# Patient Record
Sex: Male | Born: 1935
Health system: Southern US, Community
[De-identification: ages and names within clinical notes are randomized; demographics above are authoritative.]

## PROBLEM LIST (undated history)

## (undated) DIAGNOSIS — M545 Low back pain, unspecified: Secondary | ICD-10-CM

## (undated) DIAGNOSIS — K219 Gastro-esophageal reflux disease without esophagitis: Secondary | ICD-10-CM

## (undated) DIAGNOSIS — J189 Pneumonia, unspecified organism: Secondary | ICD-10-CM

## (undated) DIAGNOSIS — E7800 Pure hypercholesterolemia, unspecified: Secondary | ICD-10-CM

## (undated) DIAGNOSIS — I6521 Occlusion and stenosis of right carotid artery: Secondary | ICD-10-CM

## (undated) DIAGNOSIS — M1990 Unspecified osteoarthritis, unspecified site: Secondary | ICD-10-CM

## (undated) DIAGNOSIS — Z9981 Dependence on supplemental oxygen: Secondary | ICD-10-CM

## (undated) DIAGNOSIS — I639 Cerebral infarction, unspecified: Secondary | ICD-10-CM

## (undated) DIAGNOSIS — R911 Solitary pulmonary nodule: Secondary | ICD-10-CM

## (undated) DIAGNOSIS — I10 Essential (primary) hypertension: Secondary | ICD-10-CM

## (undated) DIAGNOSIS — G8929 Other chronic pain: Secondary | ICD-10-CM

## (undated) DIAGNOSIS — C4490 Unspecified malignant neoplasm of skin, unspecified: Secondary | ICD-10-CM

## (undated) DIAGNOSIS — J449 Chronic obstructive pulmonary disease, unspecified: Secondary | ICD-10-CM

## (undated) DIAGNOSIS — J42 Unspecified chronic bronchitis: Secondary | ICD-10-CM

## (undated) HISTORY — PX: CATARACT EXTRACTION, BILATERAL: SHX1313

## (undated) HISTORY — PX: BACK SURGERY: SHX140

## (undated) HISTORY — PX: COLONOSCOPY: SHX174

## (undated) HISTORY — PX: LUMBAR SPINE SURGERY: SHX701

---

## 2001-08-12 ENCOUNTER — Emergency Department (HOSPITAL_COMMUNITY): Admission: EM | Admit: 2001-08-12 | Discharge: 2001-08-12 | Payer: Self-pay | Admitting: Emergency Medicine

## 2005-10-09 ENCOUNTER — Ambulatory Visit (HOSPITAL_COMMUNITY): Admission: RE | Admit: 2005-10-09 | Discharge: 2005-10-09 | Payer: Self-pay | Admitting: Internal Medicine

## 2006-12-23 ENCOUNTER — Ambulatory Visit (HOSPITAL_COMMUNITY): Admission: RE | Admit: 2006-12-23 | Discharge: 2006-12-23 | Payer: Self-pay | Admitting: Ophthalmology

## 2007-01-28 ENCOUNTER — Ambulatory Visit (HOSPITAL_COMMUNITY): Admission: RE | Admit: 2007-01-28 | Discharge: 2007-01-28 | Payer: Self-pay | Admitting: Ophthalmology

## 2007-06-24 ENCOUNTER — Emergency Department (HOSPITAL_COMMUNITY): Admission: EM | Admit: 2007-06-24 | Discharge: 2007-06-24 | Payer: Self-pay | Admitting: Emergency Medicine

## 2007-08-06 ENCOUNTER — Emergency Department (HOSPITAL_COMMUNITY): Admission: EM | Admit: 2007-08-06 | Discharge: 2007-08-06 | Payer: Self-pay | Admitting: Emergency Medicine

## 2007-08-16 ENCOUNTER — Ambulatory Visit (HOSPITAL_COMMUNITY): Admission: RE | Admit: 2007-08-16 | Discharge: 2007-08-16 | Payer: Self-pay | Admitting: Internal Medicine

## 2007-11-21 ENCOUNTER — Emergency Department (HOSPITAL_COMMUNITY): Admission: EM | Admit: 2007-11-21 | Discharge: 2007-11-21 | Payer: Self-pay | Admitting: Emergency Medicine

## 2008-02-27 ENCOUNTER — Inpatient Hospital Stay (HOSPITAL_COMMUNITY): Admission: EM | Admit: 2008-02-27 | Discharge: 2008-03-03 | Payer: Self-pay | Admitting: Emergency Medicine

## 2008-02-28 ENCOUNTER — Ambulatory Visit: Payer: Self-pay | Admitting: Gastroenterology

## 2008-03-01 ENCOUNTER — Ambulatory Visit: Payer: Self-pay | Admitting: Gastroenterology

## 2008-03-02 ENCOUNTER — Ambulatory Visit: Payer: Self-pay | Admitting: Internal Medicine

## 2008-03-23 ENCOUNTER — Ambulatory Visit: Payer: Self-pay | Admitting: Gastroenterology

## 2008-03-23 ENCOUNTER — Ambulatory Visit (HOSPITAL_COMMUNITY): Admission: RE | Admit: 2008-03-23 | Discharge: 2008-03-23 | Payer: Self-pay | Admitting: Gastroenterology

## 2008-03-23 ENCOUNTER — Encounter: Payer: Self-pay | Admitting: Gastroenterology

## 2010-12-24 NOTE — Consult Note (Signed)
[name], [name]                ACCOUNT NO.:  [id number]   MEDICAL RECORD NO.:  [id number]          PATIENT TYPE:  INP   LOCATION:  A337                          FACILITY:  [facility]   PHYSICIAN:  Kassie Mends, M.D.      DATE OF BIRTH:  [birth year]   DATE OF CONSULTATION:  DATE OF DISCHARGE:                                 CONSULTATION   REQUESTING PHYSICIAN:  Dr. Felecia Shelling.   REASON FOR CONSULTATION:  Acute alcoholic pancreatitis.   HISTORY OF PRESENT ILLNESS:  [name] is a 77 year old Caucasian  male.  He has a 50+ year history of heavy alcohol abuse.  He denies any  history of pancreatitis.  He tells me he consumes about half a pint of  liquor a day as well as a little bit of beer too.  He awakened yesterday  morning, he felt like he had to go to the bathroom.  He began to have  abdominal pain around the umbilicus.  He tells me he was just not  feeling right.  He ended up calling one of his sons to bring him to the  hospital.  The pain radiated throughout his entire abdomen.  Pain was 6  out of 10 on pain scale.  He did have nausea as well as vomiting.  Pain  did not radiate to his back.  The pain was intermittent.  He does have  heartburn and a history of chronic heartburn and indigestion for which  he takes Prevacid and tells me it is well-controlled prior to this time.  He denies any dysphagia or odynophagia.  Denies any anorexia or early  satiety.  He denies any weight loss.  He does have generally a soft  brown bowel movement on a daily basis but has noticed small amounts of  bright red blood mixed in with his stool and with wiping.  He has seen  dark stools as well.  He has never had a colonoscopy, does not think he  has had an EGD either.  His amylase is elevated at 209, lipase 510.  He  has been started on alcohol withdrawal protocol, being given Dilaudid  for pain, started on thiamine, and a CT of the abdomen and pelvis is  pending.  Acute abdominal series showed mild  hyperinflation, COPD and  atherosclerosis.   PAST MEDICAL AND SURGICAL HISTORY:  1. Alcohol abuse.  2. Back surgery.  3. Hypertension.  4. GERD.  5. BPH.   MEDICATIONS:  Prior to admission:  1. Diovan 160/12.5 mg daily.  2. Flomax 0.4 mg daily.  3. Prevacid 30 mg daily.  4. Hydroxyzine HCl 25 mg daily.  5. Ambien 5 mg nightly.  6. Hydrocodone 5/500 mg p.r.n.   ALLERGIES:  No known drug allergies.   FAMILY HISTORY:  Both parents are deceased; however, patient is unsure  how.  He has 3 brothers, 4 sisters.  There is no known family history of  carcinoma, liver or chronic GI problems; however, patient is poor  historian.   SOCIAL HISTORY:  [name] is has 4 sons, 2 daughters living.  One  daughter deceased secondary to her esophagus came loose.  He is a  retired Music therapist.  He has a 50+ pack-year history of tobacco abuse.  He  denies any drug use.  He does live alone.  He is accompanied by his son  [name] today.   REVIEW OF SYSTEMS:  See HPI.  He has had some lightheadedness and some  fatigue.  Otherwise negative review of systems.   PHYSICAL EXAM:  Weight 72 .3 kilograms.  Height 71 inches.  Temperature  97.3.  Pulse 101.  Respirations 18.  Blood pressure 141/77.  O2 sat 95%  on room air.  GENERAL:  [name] is a disheveled-appearing Caucasian male who is  alert, oriented, pleasant and cooperative in no acute distress.  HEENT:  Sclerae are injected. conjunctivae pink.  Oropharynx pink and  moist.  He does have an NG tube intact with brown gastric contents.  CHEST:  Heart regular rate and rhythm.  Normal S1, S2.  LUNGS:  With rhonchi bilaterally.  No acute distress.  ABDOMEN:  Positive bowel sounds x4.  No bruits auscultated.  Soft,  nondistended.  He does have mild tenderness around the umbilicus.  There  is no rebound, tenderness or guarding.  No hepatosplenomegaly or mass,  although exam is limited.  EXTREMITIES:  He does have clubbing.  No edema.    LABORATORY STUDIES:  From today include sodium 133, potassium 3.2,  chloride 100, CO2 27, glucose 123, creatinine 5.  She had a BUN 5,  creatinine 0.84 and calcium 8.3.  From yesterday he had normal LFTs and  an alcohol level of 115.  He had a white blood cell count of 9.5,  hemoglobin 14.3, hematocrit 41.6 and platelet count 175.   IMPRESSION:  [name] is a 77 year old Caucasian male with what  appears to be acute pancreatitis secondary to alcohol..  He does have a  CT scan and abdomen and pelvis pending.  He has history of chronic  gastroesophageal reflux disease as well.  He has had hematochezia which  is going to require further workup as well.  He also has hypokalemia   PLAN:  1. Will follow up CT scan of abdomen and pelvis.  2. Nothing by mouth, rest, pain control and antiemetics.  3. Urged alcohol cessation.  4. He will need a colonoscopy and EGD once stable.  This will need to      be done under propofol in the operating room most likely as an      outpatient considering the fact that his hemoglobin is normal.  5. Potassium replacement per primary care physician.  6. Will follow up post CT scan.   Thank you Dr. Felecia Shelling for allowing [place] to participate in the care of Mr.  Hensley.      [name], N.P.      Kassie Mends, M.D.  Electronically Signed    KJ/MEDQ  D:  02/28/2008  T:  02/28/2008  Job:  [id number]   cc:   Tesfaye D. Felecia Shelling, MD  Fax: [phone]

## 2010-12-24 NOTE — Op Note (Signed)
[name], [name]                ACCOUNT NO.:  [id number]   MEDICAL RECORD NO.:  [id number]          PATIENT TYPE:  AMB   LOCATION:  DAY                           FACILITY:  [facility]   PHYSICIAN:  Kassie Mends, M.D.      DATE OF BIRTH:  1933/03/26   DATE OF PROCEDURE:  03/23/2008  DATE OF DISCHARGE:                               OPERATIVE REPORT   REFERRING PHYSICIAN:  Tesfaye D. Felecia Shelling, MD   PROCEDURES:  1. Colonoscopy with snare cautery and cold forceps polypectomy.  2. Esophagogastroduodenoscopy with cold forceps biopsy.   INDICATION FOR EXAM:  [name] is a 77 year old male who presented  with rectal bleeding and dyspepsia.  He was seen as an inpatient for  alcoholic hepatitis with a lipase of 510.   FINDINGS:  1. A 1.5-cm sessile ascending colon polyp removed via snare cautery.      A 8-mm sessile ascending colon polyp removed via snare cautery.  A      6-mm sessile descending colon polyp removed via snare cautery.  A 4-      mm descending colon polyp removed via cold forceps.  A 4-mm sigmoid      colon polyp removed via cold snare.  A 8-mm slightly pedunculated      sigmoid colon polyp removed via snare cautery.  2. Moderate internal hemorrhoids.  Otherwise, no masses, inflammatory      changes, or diverticular AVM seen.  3. Patent Schatzki's ring.  Otherwise, normal esophagus without      evidence of Barrett's mass, erosions, or ulcerations.  No      esophageal varices.  4. Patchy erythema throughout the body and the antrum of the stomach.      Biopsies obtained via cold forceps to evaluate for H. pylori      gastritis.  No old blood or fresh blood seen in the stomach.  5. Patchy erythema in the duodenal bulb and the proximal portion of      the second portion of the duodenum.  No old blood or fresh blood      seen in the duodenum.   DIAGNOSES:  1. Multiple colon polyps.  2. Moderate internal hemorrhoids.  The most likely etiology for Mr.      Hensley rectal bleeding  is hemorrhoids.  3. Patent Schatzki's ring.  4. Gastritis. The most likely etiology for [name] dyspepsia is      gastritis.  The differential diagnoses includes alcoholic gastritis      or Helicobacter pylori gastritis.   RECOMMENDATIONS:  1. Screening colonoscopy in 5 to 10 years.  2. Will call [name] with the results of his biopsies.  3. No aspirin, NSAIDs, or anticoagulations for 7 days.  4. He should follow a high-fiber diet.  He is given handout on high-      fiber diet, polyps, and hemorrhoids.  5. Continue Prevacid.   MEDICATIONS:  MAC provided by anesthesia.   PROCEDURE TECHNIQUE:  Physical exam was performed.  Informed consent was  obtained from the patient explaining benefits, risks, and alternatives  to the procedure.  The patient was connected to the monitor and placed  in left lateral position.  Continuous oxygen was provided by nasal  cannula and IV medicine administered through an indwelling cannula.  After administration of sedation and rectal exam, the patient's rectum  was intubated and the scope was advanced under direct visualization to  the cecum.  The scope was removed slowly by carefully examining the  color, texture, anatomy, and integrity of the mucosa on the way out.   After the colonoscopy, the patient's esophagus was intubated with a  diagnostic gastroscope and the scope was advanced under direct  visualization to the second portion of the duodenum.  Scope was removed  slowly by carefully examining the color, texture, anatomy, and integrity  of the mucosa on the way out.  The patient was recovered in endoscopy  and discharged home in satisfactory condition.   PATH:  Simple adenomas. Chronic gastritis. Avoid EtOH. TCS in 10 years  if he remains healthy.      Kassie Mends, M.D.  Electronically Signed     SM/MEDQ  D:  03/23/2008  T:  03/23/2008  Job:  [id number]   cc:   Tesfaye D. Felecia Shelling, MD  Fax: [phone]

## 2010-12-24 NOTE — H&P (Signed)
NAMEJAHN, [name]                ACCOUNT NO.:  [id number]   MEDICAL RECORD NO.:  [id number]          PATIENT TYPE:  INP   LOCATION:  A337                          FACILITY:  [facility]   PHYSICIAN:  Angus G. Renard Matter, MD   DATE OF BIRTH:  1933/02/16   DATE OF ADMISSION:  02/27/2008  DATE OF DISCHARGE:  LH                              HISTORY & PHYSICAL   This patient was admitted through the emergency department with the  chief complaint being abdominal pain.  This 77 year old male presented  with abdominal pain.  He apparently uses alcohol to excess.  The pain is  located in periumbilical area.   PERTINENT LABORATORY STUDIES:  CBC; WBC 9500, hemoglobin 14.3, and  hematocrit 41.6.  Lipase 142.  Alcohol level 115.  Comprehensive  metabolic panel; liver enzymes normal.  Chemistry is otherwise normal.  The patient did have acute abdominal pain.  On chest x-ray, mild  hyperinflation noted and COPD thought to be present on chest.  No free  air, nonspecific bowel gas pattern without obstruction or ileus.   SOCIAL HISTORY:  The patient does smoke and drinks a fair amount of  alcohol.   PAST MEDICAL HISTORY:  The patient has history of hypertension, acute  and chronic back pain.   ALLERGIES:  No known drug allergies.   MEDICATION LIST:  1. Diovan 160/12.5 mg daily.  2. Flomax  0.4 mg daily.  3. Prevacid 30 mg daily.  4. Lactulose 10 g b.i.d.   REVIEW OF SYSTEMS:  HEENT:  Negative.  CARDIOPULMONARY:  No cough,  mumps, or dyspnea.  GI:  Bouts of upper abdominal pain.  No diarrhea or  bleeding.  GU:  No dysuria or hematuria.   OBJECTIVE:  VITAL SIGNS:  Blood pressure 181/96, respiration 20, pulse  89, and temp 96.9.  HEENT:  Eyes, PERRLA.  TM, negative.  Oropharynx benign.  NECK:  Supple.  No JVD or thyroid abnormalities.  HEART:  Regular rhythm.  No murmurs.  ABDOMEN:  Tenderness in periumbilical area.  No organomegaly.  LUNGS:  Clear to P&A.  EXTREMITIES:  Free of edema.   DIAGNOSIS:  Abdominal pain thought secondary to acute pancreatitis.      Angus G. Renard Matter, MD  Electronically Signed     AGM/MEDQ  D:  02/27/2008  T:  02/28/2008  Job:  [id number]

## 2010-12-27 NOTE — Discharge Summary (Signed)
[name], [name]                ACCOUNT NO.:  [id number]   MEDICAL RECORD NO.:  [id number]          PATIENT TYPE:  INP   LOCATION:  A337                          FACILITY:  [facility]   PHYSICIAN:  Tesfaye D. Felecia Shelling, MD   DATE OF BIRTH:  [birth year]   DATE OF ADMISSION:  02/27/2008  DATE OF DISCHARGE:  07/24/2009LH                               DISCHARGE SUMMARY   DISCHARGE DIAGNOSES:  1. Acute pancreatitis.  2. Pneumonia with pleural effusion.  3. Hypokalemia.  4. History of ethyl alcohol abuse.  5. Hypertension.  6. Diskogenic disease.  7. Gastroesophageal reflux disease.  8. Benign prostatic hypertrophy.   DISCHARGE MEDICATIONS:  1. Diovan HCT 160/12.5 one tablets p.o. daily.  2. Flomax 0.4 mg daily.  3. Protonix 40 mg daily.  4. Lactulose 10 mL b.i.d. p.r.n.  5. Thiamine 100 mg daily.  6. Folic acid 1 mg daily.  7. Augmentin 500 mg p.o. t.i.d. for 3 days.   DISPOSITION:  The patient was discharged to home in stable condition.   HOSPITAL COURSE:  This is a 77 year old male patient with history of  hypertension and diskogenic disease who came into emergency room due to  abdominal pain.  The patient has a history of alcohol abuse.  His lab  tests showed an elevated lipase and his alcohol level was also 115.  He  was admitted as the case of acute pancreatitis.  His chest x-ray also  showed sign of infiltrate with pleural effusion.  He was treated  conservatively.  GI consult was done.  He was also started on IV  antibiotics.  Over hospital stay, his symptoms improved.  He was back to  his baseline.  His lipase and amylase came to normal.  The patient was  discharged to home in a stable condition to continue his followup in  outpatient.      Tesfaye D. Felecia Shelling, MD  Electronically Signed    TDF/MEDQ  D:  04/06/2008  T:  04/06/2008  Job:  [id number]

## 2011-05-06 LAB — DIFFERENTIAL
Basophils Absolute: 0
Eosinophils Absolute: 0.1
Neutro Abs: 4.7
Neutrophils Relative %: 75

## 2011-05-06 LAB — URINALYSIS, ROUTINE W REFLEX MICROSCOPIC
Bilirubin Urine: NEGATIVE
Glucose, UA: NEGATIVE
Hgb urine dipstick: NEGATIVE
Ketones, ur: NEGATIVE
Protein, ur: NEGATIVE
Specific Gravity, Urine: 1.015
Urobilinogen, UA: 0.2

## 2011-05-06 LAB — BASIC METABOLIC PANEL
CO2: 28
Calcium: 9.5
GFR calc Af Amer: 53 — ABNORMAL LOW
GFR calc non Af Amer: 44 — ABNORMAL LOW
Glucose, Bld: 145 — ABNORMAL HIGH
Potassium: 5.5 — ABNORMAL HIGH
Sodium: 133 — ABNORMAL LOW

## 2011-05-06 LAB — CBC
HCT: 41.2
WBC: 6.3

## 2011-05-09 LAB — BASIC METABOLIC PANEL
BUN: 2 — ABNORMAL LOW
BUN: 5 — ABNORMAL LOW
BUN: 5 — ABNORMAL LOW
BUN: 17
CO2: 25
CO2: 26
CO2: 27
Calcium: 8.2 — ABNORMAL LOW
Calcium: 8.3 — ABNORMAL LOW
Calcium: 9.1
Calcium: 9.9
Chloride: 101
Chloride: 101
Creatinine, Ser: 0.79
Creatinine, Ser: 0.84
Creatinine, Ser: 0.84
Creatinine, Ser: 0.85
Creatinine, Ser: 0.96
GFR calc Af Amer: >60
GFR calc Af Amer: >60
GFR calc non Af Amer: >60
GFR calc non Af Amer: >60
GFR calc non Af Amer: >60
Glucose, Bld: 112 — ABNORMAL HIGH
Glucose, Bld: 122 — ABNORMAL HIGH
Glucose, Bld: 123 — ABNORMAL HIGH
Glucose, Bld: 100 — ABNORMAL HIGH
Potassium: 3.1 — ABNORMAL LOW
Potassium: 3.2 — ABNORMAL LOW
Sodium: 133 — ABNORMAL LOW

## 2011-05-09 LAB — DIFFERENTIAL
Basophils Absolute: 0
Basophils Relative: 0
Eosinophils Absolute: 0.2
Eosinophils Relative: 0
Eosinophils Relative: 3
Lymphocytes Relative: 7 — ABNORMAL LOW
Lymphocytes Relative: 8 — ABNORMAL LOW
Lymphs Abs: 0.7
Lymphs Abs: 0.9
Monocytes Absolute: 0.1
Monocytes Relative: 4
Monocytes Relative: 6
Neutro Abs: 8.6 — ABNORMAL HIGH
Neutro Abs: 9.4 — ABNORMAL HIGH
Neutrophils Relative %: 89 — ABNORMAL HIGH

## 2011-05-09 LAB — CBC
HCT: 36.7 — ABNORMAL LOW
Hemoglobin: 12.9 — ABNORMAL LOW
MCHC: 34.4
MCHC: 34.6
MCV: 97.6
MCV: 98.4
MCV: 99.5
Platelets: 175
Platelets: 187
RBC: 3.73 — ABNORMAL LOW
RDW: 14.8
WBC: 8.6

## 2011-05-09 LAB — HEPATIC FUNCTION PANEL
AST: 20
Albumin: 2.9 — ABNORMAL LOW
Alkaline Phosphatase: 70
Total Protein: 5.4 — ABNORMAL LOW

## 2011-05-09 LAB — COMPREHENSIVE METABOLIC PANEL
AST: 22
BUN: 9
Chloride: 99
GFR calc Af Amer: >60
Glucose, Bld: 153 — ABNORMAL HIGH
Potassium: 3.7
Sodium: 133 — ABNORMAL LOW

## 2011-05-09 LAB — AMYLASE: Amylase: 209 — ABNORMAL HIGH

## 2011-05-09 LAB — LIPASE, BLOOD
Lipase: 142 — ABNORMAL HIGH
Lipase: 47

## 2011-05-16 LAB — DIFFERENTIAL
Eosinophils Absolute: 0
Eosinophils Relative: 0
Lymphocytes Relative: 11 — ABNORMAL LOW
Lymphs Abs: 1.1
Monocytes Relative: 6

## 2011-05-16 LAB — BASIC METABOLIC PANEL
BUN: 13
CO2: 26
Calcium: 9.3
Creatinine, Ser: 1.36
Glucose, Bld: 135 — ABNORMAL HIGH
Sodium: 126 — ABNORMAL LOW

## 2011-05-16 LAB — CBC
Hemoglobin: 15.2
MCHC: 34
Platelets: 267
RDW: 14.7

## 2011-05-16 LAB — POCT CARDIAC MARKERS
CKMB, poc: 2.9
Myoglobin, poc: >500
Troponin i, poc: <0.05

## 2011-05-16 LAB — LIPASE, BLOOD: Lipase: 16

## 2011-05-16 LAB — AMYLASE: Amylase: 33

## 2011-08-26 DIAGNOSIS — I10 Essential (primary) hypertension: Secondary | ICD-10-CM | POA: Diagnosis not present

## 2011-08-26 DIAGNOSIS — J449 Chronic obstructive pulmonary disease, unspecified: Secondary | ICD-10-CM | POA: Diagnosis not present

## 2013-01-24 DIAGNOSIS — R404 Transient alteration of awareness: Secondary | ICD-10-CM | POA: Diagnosis not present

## 2013-01-24 DIAGNOSIS — R5381 Other malaise: Secondary | ICD-10-CM | POA: Diagnosis not present

## 2013-01-25 ENCOUNTER — Emergency Department (HOSPITAL_COMMUNITY): Payer: Medicare Other

## 2013-01-25 ENCOUNTER — Encounter (HOSPITAL_COMMUNITY): Payer: Self-pay | Admitting: Emergency Medicine

## 2013-01-25 ENCOUNTER — Emergency Department (HOSPITAL_COMMUNITY)
Admission: EM | Admit: 2013-01-25 | Discharge: 2013-01-25 | Disposition: A | Discharge status: Home / Self Care | Payer: Medicare Other | Attending: Emergency Medicine | Admitting: Emergency Medicine

## 2013-01-25 DIAGNOSIS — R05 Cough: Secondary | ICD-10-CM | POA: Insufficient documentation

## 2013-01-25 DIAGNOSIS — J441 Chronic obstructive pulmonary disease with (acute) exacerbation: Secondary | ICD-10-CM | POA: Diagnosis not present

## 2013-01-25 DIAGNOSIS — R059 Cough, unspecified: Secondary | ICD-10-CM | POA: Insufficient documentation

## 2013-01-25 DIAGNOSIS — R5381 Other malaise: Secondary | ICD-10-CM | POA: Diagnosis not present

## 2013-01-25 DIAGNOSIS — I10 Essential (primary) hypertension: Secondary | ICD-10-CM | POA: Insufficient documentation

## 2013-01-25 DIAGNOSIS — I499 Cardiac arrhythmia, unspecified: Secondary | ICD-10-CM | POA: Diagnosis not present

## 2013-01-25 DIAGNOSIS — F17200 Nicotine dependence, unspecified, uncomplicated: Secondary | ICD-10-CM | POA: Insufficient documentation

## 2013-01-25 DIAGNOSIS — R52 Pain, unspecified: Secondary | ICD-10-CM | POA: Insufficient documentation

## 2013-01-25 DIAGNOSIS — R0602 Shortness of breath: Secondary | ICD-10-CM | POA: Diagnosis not present

## 2013-01-25 DIAGNOSIS — J9819 Other pulmonary collapse: Secondary | ICD-10-CM | POA: Diagnosis not present

## 2013-01-25 DIAGNOSIS — J449 Chronic obstructive pulmonary disease, unspecified: Secondary | ICD-10-CM

## 2013-01-25 DIAGNOSIS — E860 Dehydration: Secondary | ICD-10-CM | POA: Diagnosis not present

## 2013-01-25 HISTORY — DX: Chronic obstructive pulmonary disease, unspecified: J44.9

## 2013-01-25 HISTORY — DX: Essential (primary) hypertension: I10

## 2013-01-25 LAB — BASIC METABOLIC PANEL
BUN: 33 mg/dL — ABNORMAL HIGH (ref 6–23)
Chloride: 87 mEq/L — ABNORMAL LOW (ref 96–112)
GFR calc Af Amer: 45 mL/min — ABNORMAL LOW (ref >90)
GFR calc non Af Amer: 39 mL/min — ABNORMAL LOW (ref >90)
Potassium: 3.4 mEq/L — ABNORMAL LOW (ref 3.5–5.1)
Sodium: 131 mEq/L — ABNORMAL LOW (ref 135–145)

## 2013-01-25 LAB — CBC WITH DIFFERENTIAL/PLATELET
Basophils Absolute: 0 10*3/uL (ref 0.0–0.1)
Basophils Relative: 0 % (ref 0–1)
Eosinophils Absolute: 0 10*3/uL (ref 0.0–0.7)
Hemoglobin: 18.8 g/dL — ABNORMAL HIGH (ref 13.0–17.0)
MCH: 33.5 pg (ref 26.0–34.0)
MCHC: 37.4 g/dL — ABNORMAL HIGH (ref 30.0–36.0)
Monocytes Relative: 9 % (ref 3–12)
Neutro Abs: 6.3 10*3/uL (ref 1.7–7.7)
Neutrophils Relative %: 74 % (ref 43–77)
Platelets: 113 10*3/uL — ABNORMAL LOW (ref 150–400)

## 2013-01-25 LAB — TROPONIN I: Troponin I: <0.3 ng/mL (ref <0.30)

## 2013-01-25 LAB — PRO B NATRIURETIC PEPTIDE: Pro B Natriuretic peptide (BNP): 1673 pg/mL — ABNORMAL HIGH (ref 0–450)

## 2013-01-25 MED ORDER — HYDROCODONE-ACETAMINOPHEN 5-325 MG PO TABS: 2.0000 | ORAL_TABLET | ORAL | Status: DC | PRN

## 2013-01-25 MED ORDER — PREDNISONE 20 MG PO TABS: ORAL_TABLET | ORAL | Status: DC

## 2013-01-25 MED ORDER — IPRATROPIUM BROMIDE 0.02 % IN SOLN
0.5000 mg | Freq: Once | RESPIRATORY_TRACT | Status: AC
  Administered 2013-01-25: 0.5 mg via RESPIRATORY_TRACT
  Filled 2013-01-25: qty 2.5

## 2013-01-25 MED ORDER — SODIUM CHLORIDE 0.9 % IV BOLUS (SEPSIS)
1000.0000 mL | Freq: Once | INTRAVENOUS | Status: AC
  Administered 2013-01-25: 1000 mL via INTRAVENOUS

## 2013-01-25 MED ORDER — ALBUTEROL SULFATE (5 MG/ML) 0.5% IN NEBU
5.0000 mg | INHALATION_SOLUTION | Freq: Once | RESPIRATORY_TRACT | Status: AC
  Administered 2013-01-25: 5 mg via RESPIRATORY_TRACT
  Filled 2013-01-25: qty 1

## 2013-01-25 MED ORDER — ALBUTEROL SULFATE (2.5 MG/3ML) 0.083% IN NEBU: 2.5000 mg | INHALATION_SOLUTION | RESPIRATORY_TRACT | Status: AC | PRN

## 2013-01-25 NOTE — ED Provider Notes (Signed)
History     This chart was scribed for Travis Hutching, MD, MD by Smitty Pluck, ED Scribe. The patient was seen in room APA05/APA05 and the patient's care was started at 12:28 PM.   CSN: [id number]  Arrival date & time 01/25/13  1156       Chief Complaint  Patient presents with  . Shortness of Breath  . Weakness     The history is provided by medical records, a relative and the patient. No language interpreter was used.   HPI Comments: [name] is a 79 y.o. male with hx of HTN who presents to the Emergency Department complaining of constant, moderate productive cough and SOB that has been ongoing for the past month and worsening within 2-3 days. Pt is unsure of sputum color. Daughter states that pt has gotten weaker within the past month. Pt was seen by Dr. Felecia Shelling today and referred to ED. Pt was evaluated by EMS last night due to daughter calling 9-1-1 and they states he was dehydrated. He denies using O2 at home. Pt reports that he has generalized body aches that he describes as "cramps." Pt reports that he smokes cigarettes and drinks alcohol occasionally.   Past Medical History  Diagnosis Date  . Hypertension     Past Surgical History  Procedure Laterality Date  . Back surgery      No family history on file.  History  Substance Use Topics  . Smoking status: Current Every Day Smoker -- 1.00 packs/day    Types: Cigarettes  . Smokeless tobacco: Not on file  . Alcohol Use: No      Review of Systems 10 Systems reviewed and all are negative for acute change except as noted in the HPI.   Allergies  Review of patient's allergies indicates no known allergies.  Home Medications  No current outpatient prescriptions on file.  There were no vitals taken for this visit.  Physical Exam  Nursing note and vitals reviewed. Constitutional: He appears well-developed and well-nourished. He appears cachectic.  Appears dehydrated  HENT:  Head: Normocephalic and atraumatic.   Eyes: Conjunctivae and EOM are normal. Pupils are equal, round, and reactive to light.  Neck: Normal range of motion. Neck supple.  Cardiovascular: Normal rate, regular rhythm and normal heart sounds.   Pulmonary/Chest: Effort normal. He has wheezes (minimal expiratory).  Abdominal: Soft. Bowel sounds are normal.  Musculoskeletal: Normal range of motion.  Neurological: He is alert.  Skin: Skin is warm and dry.  Psychiatric: He has a normal mood and affect.    ED Course  Procedures (including critical care time) DIAGNOSTIC STUDIES: Oxygen Saturation is 97% on room air, adequate by my interpretation.    COORDINATION OF CARE: 12:31 PM Discussed ED treatment with pt and pt agrees to IV fluids, chest xray and labs.     Labs Reviewed  CBC WITH DIFFERENTIAL - Abnormal; Notable for the following:    Hemoglobin 18.8 (*)    MCHC 37.4 (*)    Platelets 113 (*)    All other components within normal limits  BASIC METABOLIC PANEL - Abnormal; Notable for the following:    Sodium 131 (*)    Potassium 3.4 (*)    Chloride 87 (*)    Glucose, Bld 100 (*)    BUN 33 (*)    Creatinine, Ser 1.63 (*)    GFR calc non Af Amer 39 (*)    GFR calc Af Amer 45 (*)    All other components  within normal limits  PRO B NATRIURETIC PEPTIDE - Abnormal; Notable for the following:    Pro B Natriuretic peptide (BNP) 1673.0 (*)    All other components within normal limits  TROPONIN I   No results found. Dg Chest 2 View  01/25/2013   *RADIOLOGY REPORT*  Clinical Data: Shortness of breath.  Weakness.  CHEST - 2 VIEW  Comparison: 03/02/2008.  Findings: Normal sized heart.  Clear lungs.  The lungs remain hyperexpanded with mildly prominent interstitial markings. Decreased density at the right lung base with no nodule seen. Bilateral nipple shadows are noted.  Mild thoracic spine degenerative changes.  IMPRESSION:  1.  Stable changes of COPD. 2.  Improved right basilar atelectasis with no nodule seen.   Original  Report Authenticated By: Beckie Salts, M.D.    No diagnosis found.   Date: 01/25/2013  Rate: 94  Rhythm: normal sinus rhythm  QRS Axis: normal  Intervals: normal  ST/T Wave abnormalities: normal  Conduction Disutrbances:right bundle branch block and left anterior fascicular block  Narrative Interpretation:   Old EKG Reviewed: changes noted   MDM  Patient has long-standing COPD.  Chest xray shows no acute findings.  D/C meds albuterol soln, prenisone, norco.  Pt has primary care f/u.   Hemodynamically stable     I personally performed the services described in this documentation, which was scribed in my presence. The recorded information has been reviewed and is accurate.   Travis Hutching, MD 01/25/13 1536

## 2013-01-25 NOTE — ED Notes (Signed)
Pt states he has not been feeling well x 2 weeks. States he has become increasingly weak and sob x 2-3 days. Denies pain.

## 2013-01-25 NOTE — ED Notes (Signed)
Dr Cook at bedside,  

## 2013-01-25 NOTE — ED Notes (Addendum)
Pt presents to er with c/o sob, generalized weakness that started a few weeks ago, becoming worse over the past two days, was sent to er for further evaluation by Dr. Felecia Shelling, pt alert, able to answer questions, sob is worse with exertion. Denies any swelling or pain.  admits to cough that is productive, unsure of color of sputum and chills.  Pt does state that he has had problems with his abd area with abd pain, pt points to epigastric area when asked to show where but states that the problems with his abd are gone.

## 2013-01-25 NOTE — ED Notes (Signed)
Pt heart rate increased to 148 on monitor, pt alert able to answer questions, denies any chest pain, feeling of hr beating fast, sob. Repeat ekg performed given to Dr. Adriana Simas

## 2013-03-21 ENCOUNTER — Encounter: Payer: Self-pay | Admitting: Gastroenterology

## 2014-04-18 DIAGNOSIS — R7309 Other abnormal glucose: Secondary | ICD-10-CM | POA: Diagnosis not present

## 2014-04-18 DIAGNOSIS — R799 Abnormal finding of blood chemistry, unspecified: Secondary | ICD-10-CM | POA: Diagnosis not present

## 2014-04-18 DIAGNOSIS — F17200 Nicotine dependence, unspecified, uncomplicated: Secondary | ICD-10-CM | POA: Diagnosis not present

## 2014-04-18 DIAGNOSIS — I10 Essential (primary) hypertension: Secondary | ICD-10-CM | POA: Diagnosis not present

## 2014-04-18 DIAGNOSIS — Z23 Encounter for immunization: Secondary | ICD-10-CM | POA: Diagnosis not present

## 2014-04-18 DIAGNOSIS — M549 Dorsalgia, unspecified: Secondary | ICD-10-CM | POA: Diagnosis not present

## 2014-04-18 DIAGNOSIS — J410 Simple chronic bronchitis: Secondary | ICD-10-CM | POA: Diagnosis not present

## 2014-09-28 DIAGNOSIS — F17200 Nicotine dependence, unspecified, uncomplicated: Secondary | ICD-10-CM | POA: Diagnosis not present

## 2014-09-28 DIAGNOSIS — C4490 Unspecified malignant neoplasm of skin, unspecified: Secondary | ICD-10-CM | POA: Diagnosis not present

## 2014-09-28 DIAGNOSIS — Z720 Tobacco use: Secondary | ICD-10-CM | POA: Diagnosis not present

## 2014-09-28 DIAGNOSIS — K861 Other chronic pancreatitis: Secondary | ICD-10-CM | POA: Diagnosis not present

## 2014-09-28 DIAGNOSIS — J449 Chronic obstructive pulmonary disease, unspecified: Secondary | ICD-10-CM | POA: Diagnosis not present

## 2014-09-28 DIAGNOSIS — M545 Low back pain: Secondary | ICD-10-CM | POA: Diagnosis not present

## 2014-09-28 DIAGNOSIS — N400 Enlarged prostate without lower urinary tract symptoms: Secondary | ICD-10-CM | POA: Diagnosis not present

## 2014-09-28 DIAGNOSIS — I10 Essential (primary) hypertension: Secondary | ICD-10-CM | POA: Diagnosis not present

## 2014-10-19 ENCOUNTER — Inpatient Hospital Stay (HOSPITAL_COMMUNITY)
Admission: EM | Admit: 2014-10-19 | Discharge: 2014-10-23 | DRG: 641 | Disposition: A | Discharge status: Home / Self Care | Payer: Medicare Other | Attending: Internal Medicine | Admitting: Internal Medicine

## 2014-10-19 ENCOUNTER — Emergency Department (HOSPITAL_COMMUNITY): Payer: Medicare Other

## 2014-10-19 ENCOUNTER — Encounter (HOSPITAL_COMMUNITY): Payer: Self-pay | Admitting: Emergency Medicine

## 2014-10-19 ENCOUNTER — Other Ambulatory Visit (HOSPITAL_COMMUNITY): Payer: Self-pay

## 2014-10-19 DIAGNOSIS — N400 Enlarged prostate without lower urinary tract symptoms: Secondary | ICD-10-CM | POA: Diagnosis present

## 2014-10-19 DIAGNOSIS — E871 Hypo-osmolality and hyponatremia: Secondary | ICD-10-CM | POA: Diagnosis not present

## 2014-10-19 DIAGNOSIS — E876 Hypokalemia: Secondary | ICD-10-CM | POA: Diagnosis present

## 2014-10-19 DIAGNOSIS — R627 Adult failure to thrive: Secondary | ICD-10-CM | POA: Diagnosis present

## 2014-10-19 DIAGNOSIS — R531 Weakness: Secondary | ICD-10-CM | POA: Diagnosis not present

## 2014-10-19 DIAGNOSIS — J449 Chronic obstructive pulmonary disease, unspecified: Secondary | ICD-10-CM | POA: Diagnosis present

## 2014-10-19 DIAGNOSIS — R0602 Shortness of breath: Secondary | ICD-10-CM | POA: Diagnosis not present

## 2014-10-19 DIAGNOSIS — R079 Chest pain, unspecified: Secondary | ICD-10-CM

## 2014-10-19 DIAGNOSIS — N179 Acute kidney failure, unspecified: Secondary | ICD-10-CM | POA: Diagnosis present

## 2014-10-19 DIAGNOSIS — I10 Essential (primary) hypertension: Secondary | ICD-10-CM | POA: Diagnosis not present

## 2014-10-19 DIAGNOSIS — R32 Unspecified urinary incontinence: Secondary | ICD-10-CM | POA: Diagnosis present

## 2014-10-19 DIAGNOSIS — M545 Low back pain: Secondary | ICD-10-CM | POA: Diagnosis not present

## 2014-10-19 DIAGNOSIS — E870 Hyperosmolality and hypernatremia: Secondary | ICD-10-CM | POA: Diagnosis not present

## 2014-10-19 DIAGNOSIS — F17210 Nicotine dependence, cigarettes, uncomplicated: Secondary | ICD-10-CM | POA: Diagnosis present

## 2014-10-19 DIAGNOSIS — J439 Emphysema, unspecified: Secondary | ICD-10-CM

## 2014-10-19 LAB — HEPATIC FUNCTION PANEL
ALT: 19 U/L (ref 0–53)
AST: 39 U/L — ABNORMAL HIGH (ref 0–37)
Albumin: 4 g/dL (ref 3.5–5.2)
Alkaline Phosphatase: 75 U/L (ref 39–117)
Bilirubin, Direct: 0.3 mg/dL (ref 0.0–0.5)
Indirect Bilirubin: 0.9 mg/dL (ref 0.3–0.9)
Total Bilirubin: 1.2 mg/dL (ref 0.3–1.2)
Total Protein: 6.7 g/dL (ref 6.0–8.3)

## 2014-10-19 LAB — BASIC METABOLIC PANEL
ANION GAP: 14 (ref 5–15)
BUN: 15 mg/dL (ref 6–23)
CALCIUM: 8.4 mg/dL (ref 8.4–10.5)
CO2: 24 mmol/L (ref 19–32)
Chloride: 69 mmol/L — ABNORMAL LOW (ref 96–112)
Creatinine, Ser: 1.07 mg/dL (ref 0.50–1.35)
GFR calc Af Amer: 74 mL/min — ABNORMAL LOW (ref >90)
GFR calc non Af Amer: 64 mL/min — ABNORMAL LOW (ref >90)
GLUCOSE: 112 mg/dL — AB (ref 70–99)
POTASSIUM: 3.2 mmol/L — AB (ref 3.5–5.1)
Sodium: 107 mmol/L — CL (ref 135–145)

## 2014-10-19 LAB — CBC
HCT: 37.5 % — ABNORMAL LOW (ref 39.0–52.0)
Hemoglobin: 14.2 g/dL (ref 13.0–17.0)
MCH: 31.8 pg (ref 26.0–34.0)
MCHC: 37.9 g/dL — ABNORMAL HIGH (ref 30.0–36.0)
MCV: 84.1 fL (ref 78.0–100.0)
Platelets: 178 10*3/uL (ref 150–400)
RBC: 4.46 MIL/uL (ref 4.22–5.81)
RDW: 13.2 % (ref 11.5–15.5)
WBC: 7.5 10*3/uL (ref 4.0–10.5)

## 2014-10-19 LAB — BRAIN NATRIURETIC PEPTIDE: B NATRIURETIC PEPTIDE 5: 36 pg/mL (ref 0.0–100.0)

## 2014-10-19 LAB — TROPONIN I: TROPONIN I: 0.03 ng/mL (ref <0.031)

## 2014-10-19 MED ORDER — SODIUM CHLORIDE 3 % IV SOLN
Freq: Once | INTRAVENOUS | Status: AC
  Administered 2014-10-19: 100 mL/h via INTRAVENOUS
  Filled 2014-10-19: qty 500

## 2014-10-19 MED ORDER — ASPIRIN 325 MG PO TABS
325.0000 mg | ORAL_TABLET | ORAL | Status: AC
  Administered 2014-10-19: 325 mg via ORAL
  Filled 2014-10-19: qty 1

## 2014-10-19 MED ORDER — NITROGLYCERIN 0.4 MG SL SUBL
0.4000 mg | SUBLINGUAL_TABLET | SUBLINGUAL | Status: DC | PRN
  Administered 2014-10-19: 0.4 mg via SUBLINGUAL
  Filled 2014-10-19: qty 1

## 2014-10-19 NOTE — H&P (Signed)
PCP:   FANTA,TESFAYE, MD   Chief Complaint:  Chest pain  HPI: 81 year old male who   has a past medical history of Hypertension and COPD (chronic obstructive pulmonary disease). Today came to the hospital with chief complaint of chest pain, abdominal pain going on for past 3 days. Patient also endorses generalized decreased appetite and weight loss over the past 3 months. He also had shortness of breath but denies shortness of breath at this time. Denies nausea vomiting or diarrhea. Patient smokes 1 pack per day. In the ED patient was found to have hyponatremia with sodium 107, patient has been started on 3% hypertonic saline.  Allergies:  No Known Allergies    Past Medical History  Diagnosis Date  . Hypertension   . COPD (chronic obstructive pulmonary disease)     Past Surgical History  Procedure Laterality Date  . Back surgery      Prior to Admission medications   Medication Sig Start Date End Date Taking? Authorizing Provider  albuterol (PROVENTIL HFA;VENTOLIN HFA) 108 (90 BASE) MCG/ACT inhaler Inhale 2 puffs into the lungs every 6 (six) hours as needed for wheezing.   Yes Historical Provider, MD  albuterol (PROVENTIL) (2.5 MG/3ML) 0.083% nebulizer solution Take 3 mLs (2.5 mg total) by nebulization every 4 (four) hours as needed for wheezing. 01/25/13   Nat Christen, MD  amLODipine (NORVASC) 5 MG tablet Take 5 mg by mouth daily. 09/28/14   Historical Provider, MD  predniSONE (DELTASONE) 20 MG tablet 3 tabs po day one, then 2 tabs daily x 4 days 01/25/13   Nat Christen, MD  tamsulosin (FLOMAX) 0.4 MG CAPS capsule Take 1 capsule by mouth daily. 09/28/14   Historical Provider, MD  valsartan-hydrochlorothiazide (DIOVAN-HCT) 160-25 MG per tablet  09/28/14   Historical Provider, MD    Social History:  reports that he has been smoking Cigarettes.  He has been smoking about 1.00 pack per day. He does not have any smokeless tobacco history on file. He reports that he does not drink alcohol  or use illicit drugs.  Family history-noncontributory  All the positives are listed in BOLD  Review of Systems:  HEENT: Headache, blurred vision, runny nose, sore throat Neck: Hypothyroidism, hyperthyroidism,,lymphadenopathy Chest : Shortness of breath, history of COPD, Asthma Heart : Chest pain, history of coronary arterey disease GI:  Nausea, vomiting, diarrhea, constipation, GERD GU: Dysuria, urgency, frequency of urination, hematuria Neuro: Stroke, seizures, syncope Psych: Depression, anxiety, hallucinations   Physical Exam: Blood pressure 135/77, pulse 83, resp. rate 13, SpO2 98 %. Constitutional:   Patient is a well-developed and well-nourished male* in no acute distress and cooperative with exam. Head: Normocephalic and atraumatic Mouth: Mucus membranes moist Eyes: PERRL, EOMI, conjunctivae normal Neck: Supple, No Thyromegaly Cardiovascular: RRR, S1 normal, S2 normal Pulmonary/Chest: CTAB, no wheezes, rales, or rhonchi Abdominal: Soft. Non-tender, non-distended, bowel sounds are normal, no masses, organomegaly, or guarding present.  Neurological: A&O x3, Strength is normal and symmetric bilaterally, cranial nerve II-XII are grossly intact, no focal motor deficit, sensory intact to light touch bilaterally.  Extremities : No Cyanosis, Clubbing or Edema  Labs on Admission:  Basic Metabolic Panel:  Recent Labs Lab 10/19/14 2140  NA 107*  K 3.2*  CL 69*  CO2 24  GLUCOSE 112*  BUN 15  CREATININE 1.07  CALCIUM 8.4   Liver Function Tests:  Recent Labs Lab 10/19/14 2200  AST 39*  ALT 19  ALKPHOS 75  BILITOT 1.2  PROT 6.7  ALBUMIN 4.0  No results for input(s): LIPASE, AMYLASE in the last 168 hours. No results for input(s): AMMONIA in the last 168 hours. CBC:  Recent Labs Lab 10/19/14 2140  WBC 7.5  HGB 14.2  HCT 37.5*  MCV 84.1  PLT 178   Cardiac Enzymes:  Recent Labs Lab 10/19/14 2140  TROPONINI 0.03    BNP (last 3 results)  Recent Labs   10/19/14 2140  BNP 36.0    ProBNP (last 3 results) No results for input(s): PROBNP in the last 8760 hours.  CBG: No results for input(s): GLUCAP in the last 168 hours.  Radiological Exams on Admission: Dg Chest Port 1 View  10/19/2014   CLINICAL DATA:  Chest pain beginning today. Generalized abdominal pain for 3 days. Shortness of breath.  EXAM: PORTABLE CHEST - 1 VIEW  COMPARISON:  01/25/2013  FINDINGS: Hyperinflation suggesting emphysema. Probable scarring in the lung bases. The heart size and mediastinal contours are within normal limits. Both lungs are clear. The visualized skeletal structures are unremarkable.  IMPRESSION: Emphysematous changes in the lungs with scarring in the lung bases. No evidence of active disease.   Electronically Signed   By: Lucienne Capers M.D.   On: 10/19/2014 22:42    EKG: Independently reviewed. Sinus rhythm   Assessment/Plan Principal Problem:   Hyponatremia Active Problems:   Chest pain   HTN (hypertension)   COPD (chronic obstructive pulmonary disease)  Hyponatremia Patient presenting with severe hyponatremia with sodium 107, 3% hypertonic saline started by the ED physician. Will check BMP every 8 hours, will place on fluid restriction 1200 mL per day. Will check serum and urine osmolality. Will check urine sodium and urine creatinine. We'll also check TSH.  Chest pain Patient's EKG shows normal sinus rhythm, will check serial troponin every 8 hours. Will give morphine for pain.   Hypertension Continue amlodipine, will hold valsartan HCTZ at this time as patient has severe hyponatremia.  COPD Patient is not short of breath at this time, will start albuterol every 2 hours when necessary for wheezing  DVT prophylaxis Lovenox   Code status: Full code  Family discussion:  no family at bedside   Time Spent on Admission: 60 min  [facility] Pager: [phone] 10/19/2014, 11:33 PM  If 7PM-7AM, please contact  night-coverage  [email or web address]  Password [email or web address]

## 2014-10-19 NOTE — ED Notes (Signed)
Pt c/o sob for a couple days pt is on 2lpm and sat is now 89

## 2014-10-19 NOTE — ED Notes (Signed)
12 lead EKG completed in triage, copy given to Dr. Baldwin Racicot Singer and copy given to Ivin Booty, [place]

## 2014-10-19 NOTE — ED Notes (Signed)
Patient presents to registration area with son patient c/o of SOB times two days. States he used an inhaler at home multiple times with no relief. VS 149/99 HR 81 oral temp 97.8 RR 24 stats 88% on Room air. Dyspnea with exertion noted. Triage nurse and charge nurse informed of all of the above information.

## 2014-10-19 NOTE — ED Notes (Signed)
CRITICAL VALUE ALERT  Critical value received:na 107  Date of notification: 10/19/14  Time of notification: 2223  Critical value read back:yes  Nurse who received alert:  smoore rn MD notified (1st page): dr Wilson Singer  Time of first page: 2225 MD notified (2nd page):  Time of second page:  Responding MD:  Dr Wilson Singer Time MD responded: 2230

## 2014-10-19 NOTE — ED Provider Notes (Signed)
CSN: [id number]     Arrival date & time 10/19/14  2026 History  This chart was scribed for Virgel Manifold, MD by Delphia Grates, ED Scribe. This patient was seen in room APA14/APA14 and the patient's care was started at 10:08 PM.     Chief Complaint  Patient presents with  . Shortness of Breath    The history is provided by the patient and medical records. No language interpreter was used.     HPI Comments: DAMASO LADAY is a 81 y.o. male, with history of COPD and HTN, who presents to the Emergency Department complaining of persistent lower abdominal pain that began 3 days ago. He is also complaining of generalized chst pain and neck pain. There is associated generalized decreased appetite and weight loss (unknown loss; over the past 3 months). He also notes bladder incontinence, but reports he is currently taking a medication for this. Patient resides with son. He denies difficulty breathing at this time. He denies fever, nausea, diarrhea, hematochezia. Patient is a current ppd smoker.  Per nursing note, patient is brought in by son complaining of SOB for the past 2 days. He has been using his inhaler without significant relief.  PCP: Rosita Fire, MD   Past Medical History  Diagnosis Date  . Hypertension   . COPD (chronic obstructive pulmonary disease)    Past Surgical History  Procedure Laterality Date  . Back surgery     No family history on file. History  Substance Use Topics  . Smoking status: Current Every Day Smoker -- 1.00 packs/day    Types: Cigarettes  . Smokeless tobacco: Not on file  . Alcohol Use: No    Review of Systems  Constitutional: Positive for appetite change (decrease) and unexpected weight change (loss).  Respiratory: Positive for shortness of breath.   Cardiovascular: Positive for chest pain.  Musculoskeletal: Positive for neck pain.  All other systems reviewed and are negative.     Allergies  Review of patient's allergies indicates no known  allergies.  Home Medications   Prior to Admission medications   Medication Sig Start Date End Date Taking? Authorizing Provider  albuterol (PROVENTIL HFA;VENTOLIN HFA) 108 (90 BASE) MCG/ACT inhaler Inhale 2 puffs into the lungs every 6 (six) hours as needed for wheezing.    Historical Provider, MD  albuterol (PROVENTIL) (2.5 MG/3ML) 0.083% nebulizer solution Take 3 mLs (2.5 mg total) by nebulization every 4 (four) hours as needed for wheezing. 01/25/13   Nat Christen, MD  HYDROcodone-acetaminophen (NORCO) 5-325 MG per tablet Take 2 tablets by mouth every 4 (four) hours as needed for pain. 01/25/13   Nat Christen, MD  ibuprofen (ADVIL,MOTRIN) 200 MG tablet Take 400 mg by mouth every 6 (six) hours as needed for pain.    Historical Provider, MD  predniSONE (DELTASONE) 20 MG tablet 3 tabs po day one, then 2 tabs daily x 4 days 01/25/13   Nat Christen, MD   BP 135/77 mmHg  Pulse 83  Resp 13  SpO2 98% Physical Exam  Constitutional: He appears well-developed and well-nourished. No distress.  HENT:  Head: Normocephalic and atraumatic.  Right Ear: External ear normal.  Left Ear: External ear normal.  Eyes: Conjunctivae are normal. Right eye exhibits no discharge. Left eye exhibits no discharge. No scleral icterus.  Neck: Neck supple. No tracheal deviation present.  Cardiovascular: Normal rate, regular rhythm and intact distal pulses.   Pulmonary/Chest: Effort normal. No stridor. No respiratory distress. He has wheezes. He has no rales.  Bilateral  wheezing.  Abdominal: Soft. Bowel sounds are normal. He exhibits no distension. There is no tenderness. There is no rebound and no guarding.  Musculoskeletal: He exhibits no edema or tenderness.  Neurological: He has normal strength. No cranial nerve deficit (no facial droop, extraocular movements intact, no slurred speech) or sensory deficit. He exhibits normal muscle tone. He displays no seizure activity. Coordination normal.  Pt very with very flat affect.  Slow to respond to questions. Follows commands but needs coaxing and movements very slow.   Skin: Skin is warm and dry. No rash noted.  Psychiatric:  Withdrawn. Flat affect.  Nursing note and vitals reviewed.   ED Course  Procedures (including critical care time)  CRITICAL CARE Performed by: Virgel Manifold   Total critical care time: 35 minutes  Critical care time was exclusive of separately billable procedures and treating other patients. Critical care was necessary to treat or prevent imminent or life-threatening deterioration. Critical care was time spent personally by me on the following activities: development of treatment plan with patient and/or surrogate as well as nursing, discussions with consultants, evaluation of patient's response to treatment, examination of patient, obtaining history from patient or surrogate, ordering and performing treatments and interventions, ordering and review of laboratory studies, ordering and review of radiographic studies, pulse oximetry and re-evaluation of patient's condition.   DIAGNOSTIC STUDIES: Oxygen Saturation is 98% on 2L [place], normal by my interpretation.    COORDINATION OF CARE: At 2216 Discussed treatment plan with patient. Patient agrees.   Labs Review Labs Reviewed  CBC - Abnormal; Notable for the following:    HCT 37.5 (*)    MCHC 37.9 (*)    All other components within normal limits  BASIC METABOLIC PANEL - Abnormal; Notable for the following:    Sodium 107 (*)    Potassium 3.2 (*)    Chloride 69 (*)    Glucose, Bld 112 (*)    GFR calc non Af Amer 64 (*)    GFR calc Af Amer 74 (*)    All other components within normal limits  HEPATIC FUNCTION PANEL - Abnormal; Notable for the following:    AST 39 (*)    All other components within normal limits  BRAIN NATRIURETIC PEPTIDE  TROPONIN I  TSH  OSMOLALITY  CREATININE, URINE, RANDOM  SODIUM, URINE, RANDOM  OSMOLALITY, URINE    Imaging Review Dg Chest Port 1  View  10/19/2014   CLINICAL DATA:  Chest pain beginning today. Generalized abdominal pain for 3 days. Shortness of breath.  EXAM: PORTABLE CHEST - 1 VIEW  COMPARISON:  01/25/2013  FINDINGS: Hyperinflation suggesting emphysema. Probable scarring in the lung bases. The heart size and mediastinal contours are within normal limits. Both lungs are clear. The visualized skeletal structures are unremarkable.  IMPRESSION: Emphysematous changes in the lungs with scarring in the lung bases. No evidence of active disease.   Electronically Signed   By: Lucienne Capers M.D.   On: 10/19/2014 22:42     EKG Interpretation None      MDM   Final diagnoses:  Hyponatremia  Failure to thrive in adult    81yM with slow steady decline in terms of function, mental status over past 3-4 months. Sodium 107. Mild hyponatremia per review of previous labs, but not nearly to this degree. May have some element of SIADH from underlying lung disease and is also on HCTZ. Doubt this accounts for Na this low though. Additional blood/urine studies sent. Pt is very drowsy/slow to  respond. No reported seizure, but given mental status like this he was given 100cc 3% saline. Fluid restrict otherwise while in ED.   I personally preformed the services scribed in my presence. The recorded information has been reviewed is accurate. Virgel Manifold, MD.    Virgel Manifold, MD 10/20/14 [phone]

## 2014-10-19 NOTE — ED Notes (Signed)
Pt is vague historian, having difficulty with number scale for pain. Also no sure how long he's been feeling bad, possibly months. Chest pain started sometime this afternoon. Generalized abd pain for "2-3 days" hasn't eaten any thing in that time.

## 2014-10-20 ENCOUNTER — Encounter (HOSPITAL_COMMUNITY): Payer: Self-pay

## 2014-10-20 LAB — BASIC METABOLIC PANEL
Anion gap: 13 (ref 5–15)
Anion gap: 10 (ref 5–15)
Anion gap: 10 (ref 5–15)
BUN: 16 mg/dL (ref 6–23)
BUN: 21 mg/dL (ref 6–23)
BUN: 22 mg/dL (ref 6–23)
CALCIUM: 8.6 mg/dL (ref 8.4–10.5)
CHLORIDE: 81 mmol/L — AB (ref 96–112)
CO2: 24 mmol/L (ref 19–32)
CO2: 26 mmol/L (ref 19–32)
CO2: 25 mmol/L (ref 19–32)
CREATININE: 1.36 mg/dL — AB (ref 0.50–1.35)
Calcium: 8.8 mg/dL (ref 8.4–10.5)
Calcium: 8.8 mg/dL (ref 8.4–10.5)
Chloride: 76 mmol/L — ABNORMAL LOW (ref 96–112)
Chloride: 79 mmol/L — ABNORMAL LOW (ref 96–112)
Creatinine, Ser: 1.16 mg/dL (ref 0.50–1.35)
Creatinine, Ser: 1.27 mg/dL (ref 0.50–1.35)
GFR calc Af Amer: 67 mL/min — ABNORMAL LOW (ref >90)
GFR calc Af Amer: 60 mL/min — ABNORMAL LOW (ref >90)
GFR calc Af Amer: 55 mL/min — ABNORMAL LOW (ref >90)
GFR calc non Af Amer: 58 mL/min — ABNORMAL LOW (ref >90)
GFR calc non Af Amer: 48 mL/min — ABNORMAL LOW (ref >90)
GFR, EST NON AFRICAN AMERICAN: 52 mL/min — AB (ref >90)
GLUCOSE: 104 mg/dL — AB (ref 70–99)
Glucose, Bld: 105 mg/dL — ABNORMAL HIGH (ref 70–99)
Glucose, Bld: 105 mg/dL — ABNORMAL HIGH (ref 70–99)
POTASSIUM: 3.9 mmol/L (ref 3.5–5.1)
POTASSIUM: 4.1 mmol/L (ref 3.5–5.1)
Potassium: 4.2 mmol/L (ref 3.5–5.1)
SODIUM: 115 mmol/L — AB (ref 135–145)
SODIUM: 116 mmol/L — AB (ref 135–145)
Sodium: 113 mmol/L — CL (ref 135–145)

## 2014-10-20 LAB — TROPONIN I
Troponin I: 0.03 ng/mL (ref <0.031)
Troponin I: 0.04 ng/mL — ABNORMAL HIGH (ref <0.031)

## 2014-10-20 LAB — URINE MICROSCOPIC-ADD ON

## 2014-10-20 LAB — CBC
HCT: 40 % (ref 39.0–52.0)
Hemoglobin: 14.9 g/dL (ref 13.0–17.0)
MCH: 31 pg (ref 26.0–34.0)
MCHC: 37.3 g/dL — ABNORMAL HIGH (ref 30.0–36.0)
MCV: 83.2 fL (ref 78.0–100.0)
PLATELETS: 203 10*3/uL (ref 150–400)
RBC: 4.81 MIL/uL (ref 4.22–5.81)
RDW: 12.8 % (ref 11.5–15.5)
WBC: 7.8 10*3/uL (ref 4.0–10.5)

## 2014-10-20 LAB — URINALYSIS, ROUTINE W REFLEX MICROSCOPIC
BILIRUBIN URINE: NEGATIVE
Glucose, UA: NEGATIVE mg/dL
Ketones, ur: NEGATIVE mg/dL
Leukocytes, UA: NEGATIVE
Nitrite: NEGATIVE
PROTEIN: NEGATIVE mg/dL
Specific Gravity, Urine: <1.005 — ABNORMAL LOW (ref 1.005–1.030)
UROBILINOGEN UA: 1 mg/dL (ref 0.0–1.0)
pH: 6 (ref 5.0–8.0)

## 2014-10-20 LAB — SODIUM, URINE, RANDOM: Sodium, Ur: 69 mmol/L

## 2014-10-20 LAB — CREATININE, URINE, RANDOM: Creatinine, Urine: 69.04 mg/dL

## 2014-10-20 LAB — OSMOLALITY: Osmolality: 225 mOsm/kg — ABNORMAL LOW (ref 275–300)

## 2014-10-20 LAB — OSMOLALITY, URINE: Osmolality, Ur: 400 mOsm/kg (ref 390–1090)

## 2014-10-20 LAB — TSH: TSH: 0.435 u[IU]/mL (ref 0.350–4.500)

## 2014-10-20 LAB — MRSA PCR SCREENING: MRSA BY PCR: NEGATIVE

## 2014-10-20 MED ORDER — NICOTINE 21 MG/24HR TD PT24
21.0000 mg | MEDICATED_PATCH | Freq: Every day | TRANSDERMAL | Status: DC
  Administered 2014-10-20 – 2014-10-23 (×4): 21 mg via TRANSDERMAL
  Filled 2014-10-20 (×4): qty 1

## 2014-10-20 MED ORDER — SODIUM CHLORIDE 0.9 % IV SOLN: 250.0000 mL | INTRAVENOUS | Status: DC | PRN

## 2014-10-20 MED ORDER — FUROSEMIDE 10 MG/ML IJ SOLN
20.0000 mg | Freq: Two times a day (BID) | INTRAMUSCULAR | Status: DC
  Administered 2014-10-20 – 2014-10-21 (×2): 20 mg via INTRAVENOUS
  Filled 2014-10-20 (×2): qty 2

## 2014-10-20 MED ORDER — ALBUTEROL SULFATE (2.5 MG/3ML) 0.083% IN NEBU: 2.5000 mg | INHALATION_SOLUTION | RESPIRATORY_TRACT | Status: DC | PRN

## 2014-10-20 MED ORDER — ONDANSETRON HCL 4 MG PO TABS: 4.0000 mg | ORAL_TABLET | Freq: Four times a day (QID) | ORAL | Status: DC | PRN

## 2014-10-20 MED ORDER — ONDANSETRON HCL 4 MG/2ML IJ SOLN: 4.0000 mg | Freq: Four times a day (QID) | INTRAMUSCULAR | Status: DC | PRN

## 2014-10-20 MED ORDER — CETYLPYRIDINIUM CHLORIDE 0.05 % MT LIQD
7.0000 mL | Freq: Two times a day (BID) | OROMUCOSAL | Status: DC
  Administered 2014-10-20 – 2014-10-22 (×5): 7 mL via OROMUCOSAL

## 2014-10-20 MED ORDER — ENSURE COMPLETE PO LIQD
237.0000 mL | Freq: Two times a day (BID) | ORAL | Status: DC
  Administered 2014-10-20 – 2014-10-23 (×6): 237 mL via ORAL

## 2014-10-20 MED ORDER — FAMOTIDINE 20 MG PO TABS
20.0000 mg | ORAL_TABLET | Freq: Once | ORAL | Status: AC
  Administered 2014-10-20: 20 mg via ORAL
  Filled 2014-10-20: qty 1

## 2014-10-20 MED ORDER — ENOXAPARIN SODIUM 40 MG/0.4ML ~~LOC~~ SOLN
40.0000 mg | SUBCUTANEOUS | Status: DC
  Administered 2014-10-20 – 2014-10-23 (×4): 40 mg via SUBCUTANEOUS
  Filled 2014-10-20 (×4): qty 0.4

## 2014-10-20 MED ORDER — ACETAMINOPHEN 650 MG RE SUPP: 650.0000 mg | Freq: Four times a day (QID) | RECTAL | Status: DC | PRN

## 2014-10-20 MED ORDER — POTASSIUM CHLORIDE CRYS ER 20 MEQ PO TBCR
40.0000 meq | EXTENDED_RELEASE_TABLET | ORAL | Status: AC
  Administered 2014-10-20 (×2): 40 meq via ORAL
  Filled 2014-10-20 (×2): qty 2

## 2014-10-20 MED ORDER — SODIUM CHLORIDE 0.9 % IV SOLN
INTRAVENOUS | Status: DC
  Administered 2014-10-20 – 2014-10-22 (×4): via INTRAVENOUS

## 2014-10-20 MED ORDER — TAMSULOSIN HCL 0.4 MG PO CAPS
0.4000 mg | ORAL_CAPSULE | Freq: Every day | ORAL | Status: DC
  Administered 2014-10-20 – 2014-10-23 (×4): 0.4 mg via ORAL
  Filled 2014-10-20 (×4): qty 1

## 2014-10-20 MED ORDER — AMLODIPINE BESYLATE 5 MG PO TABS
5.0000 mg | ORAL_TABLET | Freq: Every day | ORAL | Status: DC
  Administered 2014-10-20 – 2014-10-23 (×4): 5 mg via ORAL
  Filled 2014-10-20 (×4): qty 1

## 2014-10-20 MED ORDER — MORPHINE SULFATE 2 MG/ML IJ SOLN: 2.0000 mg | INTRAMUSCULAR | Status: DC | PRN

## 2014-10-20 MED ORDER — SODIUM CHLORIDE 0.9 % IJ SOLN: 3.0000 mL | INTRAMUSCULAR | Status: DC | PRN

## 2014-10-20 MED ORDER — SODIUM CHLORIDE 0.9 % IJ SOLN
3.0000 mL | Freq: Two times a day (BID) | INTRAMUSCULAR | Status: DC
  Administered 2014-10-20 – 2014-10-22 (×4): 3 mL via INTRAVENOUS

## 2014-10-20 MED ORDER — ACETAMINOPHEN 325 MG PO TABS
650.0000 mg | ORAL_TABLET | Freq: Four times a day (QID) | ORAL | Status: DC | PRN
  Administered 2014-10-21: 650 mg via ORAL
  Filled 2014-10-20: qty 2

## 2014-10-20 NOTE — Care Management Utilization Note (Signed)
UR completed 

## 2014-10-20 NOTE — Progress Notes (Signed)
Subjective: Patient was admitted last night due to severe hyponatremia. He was given an infusion of 3% saline. His sodium is now 117. Patient feels but weak and oriented. No nausea, vomiting or diarrhea.  Objective: Vital signs in last 24 hours: Temp:  [97.6 F (36.4 C)-98.2 F (36.8 C)] 97.9 F (36.6 C) (03/11 1123) Pulse Rate:  [59-129] 86 (03/11 1200) Resp:  [13-24] 24 (03/11 0800) BP: (105-173)/(45-129) 138/59 mmHg (03/11 1200) SpO2:  [89 %-99 %] 99 % (03/11 1200) FiO2 (%):  [28 %] 28 % (03/11 0041) Weight:  [67.5 kg (148 lb 13 oz)] 67.5 kg (148 lb 13 oz) (03/11 0800) Weight change:  Last BM Date: 10/18/14  Intake/Output from previous day: 03/10 0701 - 03/11 0700 In: 240 [P.O.:240] Out: 866 [Urine:866]  PHYSICAL EXAM General appearance: no distress and slowed mentation Resp: diminished breath sounds bilaterally and rhonchi bilaterally Cardio: S1, S2 normal GI: soft, non-tender; bowel sounds normal; no masses,  no organomegaly Extremities: extremities normal, atraumatic, no cyanosis or edema  Lab Results:  Results for orders placed or performed during the hospital encounter of 10/19/14 (from the past 48 hour(s))  CBC     Status: Abnormal   Collection Time: 10/19/14  9:40 PM  Result Value Ref Range   WBC 7.5 4.0 - 10.5 K/uL   RBC 4.46 4.22 - 5.81 MIL/uL   Hemoglobin 14.2 13.0 - 17.0 g/dL    Comment: CORRECTED FOR COLD AGGLUTININS   HCT 37.5 (L) 39.0 - 52.0 %   MCV 84.1 78.0 - 100.0 fL   MCH 31.8 26.0 - 34.0 pg    Comment: CORRECTED FOR COLD AGGLUTININS   MCHC 37.9 (H) 30.0 - 36.0 g/dL    Comment: CORRECTED FOR COLD AGGLUTININS   RDW 13.2 11.5 - 15.5 %   Platelets 178 150 - 400 K/uL  Basic metabolic panel     Status: Abnormal   Collection Time: 10/19/14  9:40 PM  Result Value Ref Range   Sodium 107 (LL) 135 - 145 mmol/L    Comment: RESULT REPEATED AND VERIFIED CRITICAL RESULT CALLED TO, READ BACK BY AND VERIFIED WITH: MOORE,S AT 2225 ON 10/18/2013 BY ISLEY,B    Potassium 3.2 (L) 3.5 - 5.1 mmol/L   Chloride 69 (L) 96 - 112 mmol/L   CO2 24 19 - 32 mmol/L   Glucose, Bld 112 (H) 70 - 99 mg/dL   BUN 15 6 - 23 mg/dL   Creatinine, Ser 1.07 0.50 - 1.35 mg/dL   Calcium 8.4 8.4 - 10.5 mg/dL   GFR calc non Af Amer 64 (L) >90 mL/min   GFR calc Af Amer 74 (L) >90 mL/min    Comment: (NOTE) The eGFR has been calculated using the CKD EPI equation. This calculation has not been validated in all clinical situations. eGFR's persistently <90 mL/min signify possible Chronic Kidney Disease.    Anion gap 14 5 - 15  BNP (order ONLY if patient complains of dyspnea/SOB AND you have documented it for THIS visit)     Status: None   Collection Time: 10/19/14  9:40 PM  Result Value Ref Range   B Natriuretic Peptide 36.0 0.0 - 100.0 pg/mL  Troponin I (MHP)     Status: None   Collection Time: 10/19/14  9:40 PM  Result Value Ref Range   Troponin I 0.03 <0.031 ng/mL    Comment:        NO INDICATION OF MYOCARDIAL INJURY.   TSH     Status: None  Collection Time: 10/19/14 10:00 PM  Result Value Ref Range   TSH 0.435 0.350 - 4.500 uIU/mL  Hepatic function panel     Status: Abnormal   Collection Time: 10/19/14 10:00 PM  Result Value Ref Range   Total Protein 6.7 6.0 - 8.3 g/dL   Albumin 4.0 3.5 - 5.2 g/dL   AST 39 (H) 0 - 37 U/L   ALT 19 0 - 53 U/L   Alkaline Phosphatase 75 39 - 117 U/L   Total Bilirubin 1.2 0.3 - 1.2 mg/dL   Bilirubin, Direct 0.3 0.0 - 0.5 mg/dL   Indirect Bilirubin 0.9 0.3 - 0.9 mg/dL  Creatinine, urine, random     Status: None   Collection Time: 10/19/14 11:50 PM  Result Value Ref Range   Creatinine, Urine 69.04 mg/dL  Sodium, urine, random     Status: None   Collection Time: 10/19/14 11:50 PM  Result Value Ref Range   Sodium, Ur 69 mmol/L  Troponin I     Status: None   Collection Time: 10/20/14 12:42 AM  Result Value Ref Range   Troponin I <0.03 <0.031 ng/mL    Comment:        NO INDICATION OF MYOCARDIAL INJURY.   CBC     Status:  Abnormal   Collection Time: 10/20/14  8:30 AM  Result Value Ref Range   WBC 7.8 4.0 - 10.5 K/uL   RBC 4.81 4.22 - 5.81 MIL/uL   Hemoglobin 14.9 13.0 - 17.0 g/dL   HCT 40.0 39.0 - 52.0 %   MCV 83.2 78.0 - 100.0 fL   MCH 31.0 26.0 - 34.0 pg   MCHC 37.3 (H) 30.0 - 36.0 g/dL   RDW 12.8 11.5 - 15.5 %   Platelets 203 150 - 400 K/uL  Basic metabolic panel     Status: Abnormal   Collection Time: 10/20/14  8:30 AM  Result Value Ref Range   Sodium 113 (LL) 135 - 145 mmol/L    Comment: CRITICAL RESULT CALLED TO, READ BACK BY AND VERIFIED WITH: MCDANIEL,M AT 9:00AM ON 10/20/14 BY FESTERMAN,C    Potassium 3.9 3.5 - 5.1 mmol/L    Comment: DELTA CHECK NOTED   Chloride 76 (L) 96 - 112 mmol/L   CO2 24 19 - 32 mmol/L   Glucose, Bld 104 (H) 70 - 99 mg/dL   BUN 16 6 - 23 mg/dL   Creatinine, Ser 1.16 0.50 - 1.35 mg/dL   Calcium 8.6 8.4 - 10.5 mg/dL   GFR calc non Af Amer 58 (L) >90 mL/min   GFR calc Af Amer 67 (L) >90 mL/min    Comment: (NOTE) The eGFR has been calculated using the CKD EPI equation. This calculation has not been validated in all clinical situations. eGFR's persistently <90 mL/min signify possible Chronic Kidney Disease.    Anion gap 13 5 - 15  Troponin I     Status: None   Collection Time: 10/20/14  8:30 AM  Result Value Ref Range   Troponin I 0.03 <0.031 ng/mL    Comment:        NO INDICATION OF MYOCARDIAL INJURY.     ABGS No results for input(s): PHART, PO2ART, TCO2, HCO3 in the last 72 hours.  Invalid input(s): PCO2 CULTURES No results found for this or any previous visit (from the past 240 hour(s)). Studies/Results: Dg Chest Port 1 View  10/19/2014   CLINICAL DATA:  Chest pain beginning today. Generalized abdominal pain for 3 days. Shortness of breath.  EXAM: PORTABLE CHEST - 1 VIEW  COMPARISON:  01/25/2013  FINDINGS: Hyperinflation suggesting emphysema. Probable scarring in the lung bases. The heart size and mediastinal contours are within normal limits. Both  lungs are clear. The visualized skeletal structures are unremarkable.  IMPRESSION: Emphysematous changes in the lungs with scarring in the lung bases. No evidence of active disease.   Electronically Signed   By: Lucienne Capers M.D.   On: 10/19/2014 22:42    Medications: I have reviewed the patient's current medications.  Assesment:   Principal Problem:   Hyponatremia Active Problems:   Chest pain   HTN (hypertension)   COPD (chronic obstructive pulmonary disease)    Plan:  Medications reviewed Will start on normal saline Will monitor BMP daily Nephrology consult.    LOS: 1 day   Ash Mcelwain 10/20/2014, 1:00 PM

## 2014-10-20 NOTE — Consult Note (Signed)
Reason for Consult: Hyponatremia Referring Physician: Dr. Hermelinda Medicus [name] is an 81 y.o. male.  HPI: He is a patient was history of hypertension, COPD presently came with complaints of chest pain, neck pain and abdominal pain for the last 3 days. Patient also has no nausea or  vomiting. His appetite is good. Patient denies any difficulty in breathing. When he was evaluated in emergency room patient was found to have hyponatremia hence admitted to the hospital. Presently patient is alert and in no apparent distress.  Past Medical History  Diagnosis Date  . Hypertension   . COPD (chronic obstructive pulmonary disease)     Past Surgical History  Procedure Laterality Date  . Back surgery      History reviewed. No pertinent family history.  Social History:  reports that he has been smoking Cigarettes.  He has been smoking about 1.00 pack per day. He does not have any smokeless tobacco history on file. He reports that he does not drink alcohol or use illicit drugs.  Allergies: No Known Allergies  Medications: I have reviewed the patient's current medications.  Results for orders placed or performed during the hospital encounter of 10/19/14 (from the past 48 hour(s))  CBC     Status: Abnormal   Collection Time: 10/19/14  9:40 PM  Result Value Ref Range   WBC 7.5 4.0 - 10.5 K/uL   RBC 4.46 4.22 - 5.81 MIL/uL   Hemoglobin 14.2 13.0 - 17.0 g/dL    Comment: CORRECTED FOR COLD AGGLUTININS   HCT 37.5 (L) 39.0 - 52.0 %   MCV 84.1 78.0 - 100.0 fL   MCH 31.8 26.0 - 34.0 pg    Comment: CORRECTED FOR COLD AGGLUTININS   MCHC 37.9 (H) 30.0 - 36.0 g/dL    Comment: CORRECTED FOR COLD AGGLUTININS   RDW 13.2 11.5 - 15.5 %   Platelets 178 150 - 400 K/uL  Basic metabolic panel     Status: Abnormal   Collection Time: 10/19/14  9:40 PM  Result Value Ref Range   Sodium 107 (LL) 135 - 145 mmol/L    Comment: RESULT REPEATED AND VERIFIED CRITICAL RESULT CALLED TO, READ BACK BY AND VERIFIED  WITH: MOORE,S AT 2225 ON 10/18/2013 BY ISLEY,B    Potassium 3.2 (L) 3.5 - 5.1 mmol/L   Chloride 69 (L) 96 - 112 mmol/L   CO2 24 19 - 32 mmol/L   Glucose, Bld 112 (H) 70 - 99 mg/dL   BUN 15 6 - 23 mg/dL   Creatinine, Ser 1.07 0.50 - 1.35 mg/dL   Calcium 8.4 8.4 - 10.5 mg/dL   GFR calc non Af Amer 64 (L) >90 mL/min   GFR calc Af Amer 74 (L) >90 mL/min    Comment: (NOTE) The eGFR has been calculated using the CKD EPI equation. This calculation has not been validated in all clinical situations. eGFR's persistently <90 mL/min signify possible Chronic Kidney Disease.    Anion gap 14 5 - 15  BNP (order ONLY if patient complains of dyspnea/SOB AND you have documented it for THIS visit)     Status: None   Collection Time: 10/19/14  9:40 PM  Result Value Ref Range   B Natriuretic Peptide 36.0 0.0 - 100.0 pg/mL  Troponin I (MHP)     Status: None   Collection Time: 10/19/14  9:40 PM  Result Value Ref Range   Troponin I 0.03 <0.031 ng/mL    Comment:        NO  INDICATION OF MYOCARDIAL INJURY.   TSH     Status: None   Collection Time: 10/19/14 10:00 PM  Result Value Ref Range   TSH 0.435 0.350 - 4.500 uIU/mL  Hepatic function panel     Status: Abnormal   Collection Time: 10/19/14 10:00 PM  Result Value Ref Range   Total Protein 6.7 6.0 - 8.3 g/dL   Albumin 4.0 3.5 - 5.2 g/dL   AST 39 (H) 0 - 37 U/L   ALT 19 0 - 53 U/L   Alkaline Phosphatase 75 39 - 117 U/L   Total Bilirubin 1.2 0.3 - 1.2 mg/dL   Bilirubin, Direct 0.3 0.0 - 0.5 mg/dL   Indirect Bilirubin 0.9 0.3 - 0.9 mg/dL  Creatinine, urine, random     Status: None   Collection Time: 10/19/14 11:50 PM  Result Value Ref Range   Creatinine, Urine 69.04 mg/dL  Sodium, urine, random     Status: None   Collection Time: 10/19/14 11:50 PM  Result Value Ref Range   Sodium, Ur 69 mmol/L  Troponin I     Status: None   Collection Time: 10/20/14 12:42 AM  Result Value Ref Range   Troponin I <0.03 <0.031 ng/mL    Comment:        NO  INDICATION OF MYOCARDIAL INJURY.   CBC     Status: Abnormal   Collection Time: 10/20/14  8:30 AM  Result Value Ref Range   WBC 7.8 4.0 - 10.5 K/uL   RBC 4.81 4.22 - 5.81 MIL/uL   Hemoglobin 14.9 13.0 - 17.0 g/dL   HCT 40.0 39.0 - 52.0 %   MCV 83.2 78.0 - 100.0 fL   MCH 31.0 26.0 - 34.0 pg   MCHC 37.3 (H) 30.0 - 36.0 g/dL   RDW 12.8 11.5 - 15.5 %   Platelets 203 150 - 400 K/uL  Basic metabolic panel     Status: Abnormal   Collection Time: 10/20/14  8:30 AM  Result Value Ref Range   Sodium 113 (LL) 135 - 145 mmol/L    Comment: CRITICAL RESULT CALLED TO, READ BACK BY AND VERIFIED WITH: MCDANIEL,M AT 9:00AM ON 10/20/14 BY FESTERMAN,C    Potassium 3.9 3.5 - 5.1 mmol/L    Comment: DELTA CHECK NOTED   Chloride 76 (L) 96 - 112 mmol/L   CO2 24 19 - 32 mmol/L   Glucose, Bld 104 (H) 70 - 99 mg/dL   BUN 16 6 - 23 mg/dL   Creatinine, Ser 1.16 0.50 - 1.35 mg/dL   Calcium 8.6 8.4 - 10.5 mg/dL   GFR calc non Af Amer 58 (L) >90 mL/min   GFR calc Af Amer 67 (L) >90 mL/min    Comment: (NOTE) The eGFR has been calculated using the CKD EPI equation. This calculation has not been validated in all clinical situations. eGFR's persistently <90 mL/min signify possible Chronic Kidney Disease.    Anion gap 13 5 - 15  Troponin I     Status: None   Collection Time: 10/20/14  8:30 AM  Result Value Ref Range   Troponin I 0.03 <0.031 ng/mL    Comment:        NO INDICATION OF MYOCARDIAL INJURY.     Dg Chest Port 1 View  10/19/2014   CLINICAL DATA:  Chest pain beginning today. Generalized abdominal pain for 3 days. Shortness of breath.  EXAM: PORTABLE CHEST - 1 VIEW  COMPARISON:  01/25/2013  FINDINGS: Hyperinflation suggesting emphysema. Probable scarring in the  lung bases. The heart size and mediastinal contours are within normal limits. Both lungs are clear. The visualized skeletal structures are unremarkable.  IMPRESSION: Emphysematous changes in the lungs with scarring in the lung bases. No  evidence of active disease.   Electronically Signed   By: Lucienne Capers M.D.   On: 10/19/2014 22:42    Review of Systems  Constitutional: Positive for weight loss.  Respiratory: Negative for cough and sputum production.   Cardiovascular: Positive for chest pain. Negative for leg swelling.  Gastrointestinal: Positive for abdominal pain. Negative for nausea and vomiting.   Blood pressure 138/59, pulse 86, temperature 97.9 F (36.6 C), temperature source Oral, resp. rate 24, height _0  (1.803 m), weight 67.5 kg (148 lb 13 oz), SpO2 99 %. Physical Exam  Constitutional: He is oriented to person, place, and time. No distress.  Eyes: No scleral icterus.  Neck: No JVD present.  Cardiovascular: Normal rate and regular rhythm.   Respiratory: Effort normal. He has no wheezes. He has no rales.  GI: There is no tenderness. There is no rebound.  Musculoskeletal: He exhibits no edema.  Neurological: He is alert and oriented to person, place, and time.    Assessment/Plan: Problem #1 hyponatremia: Presently seems to be secondary to hypovolemic hyponatremia as patient was on Diovan hydrochlorothiazide. Patient also had hypokalemia when he came. Presently patient's sodium has increased from 107 to-113. Patient is asymptomatic. Patient at this moment seems to be alert. No history of seizure. Problem #2 hypokalemia: His potassium has corrected Problem #3 history of hypertension: His blood pressure is reasonably controlled Problem #4 history of COPD Problem #5 history of chest pain and abdominal pain. Presently patient seems to be feeling much better. However is still he'll have much interest rate. Patient also stated that he has loss some weight but he doesn't know how much. Plan: We'll check urine sodium, osmolality and serum osmolality. We'll follow patient. We'll put him on freewater section. We'll check his sodium in 2 hours.  [name] S 10/20/2014, 2:34 PM

## 2014-10-20 NOTE — Care Management Note (Addendum)
    Page 1 of 1   10/23/2014     10:33:35 AM CARE MANAGEMENT NOTE 10/23/2014  Patient:  [name], [name]   Account Number:  [id number]  Date Initiated:  10/20/2014  Documentation initiated by:  Jolene Provost  Subjective/Objective Assessment:   Pt is from home, lives with son. Pt independent at baselin. Pt has no HH services or DME's prior to admission. Pt plans to discharge home. Will need PT eval prior to discharge. Will cont to follow for CM needs.     Action/Plan:   Anticipated DC Date:  10/23/2014   Anticipated DC Plan:  HOME/SELF CARE      DC Planning Services  CM consult      Choice offered to / List presented to:             Status of service:  Completed, signed off Medicare Important Message given?  YES (If response is "NO", the following Medicare IM given date fields will be blank) Date Medicare IM given:  10/20/2014 Medicare IM given by:  Jolene Provost Date Additional Medicare IM given:  10/23/2014 Additional Medicare IM given by:  Theophilus Kinds  Discharge Disposition:  HOME/SELF CARE  Per UR Regulation:  Reviewed for med. necessity/level of care/duration of stay  If discussed at [place] of Stay Meetings, dates discussed:    Comments:  10/23/14 [place], RN BSN CM Pt discharged home today. No CM needs noted. Pt stated that he has a neb machine at home.  10/20/2014 [place], RN, MSN, CM

## 2014-10-20 NOTE — Progress Notes (Signed)
CRITICAL VALUE ALERT  Critical value received:  Na 116  Date of notification:  10/20/14  Time of notification:  1950  Critical value read back: yes  Nurse who received alert:  S. Hammock/T.Hassell Done RN  MD notified (1st page):  Dr. Legrand Rams  Time of first page:  1953  MD notified (2nd page):  Time of second page:  Responding MD: Dr. Legrand Rams  Time MD responded: 1954  - Only have to notify him once a day as long as values are going up per MD

## 2014-10-20 NOTE — Progress Notes (Signed)
INITIAL NUTRITION ASSESSMENT  DOCUMENTATION CODES Per approved criteria  -Not Applicable   INTERVENTION: -Ensure Enlive po BID, each supplement provides 350 kcal and 20 grams of protein  -Monitor oral intake and order snacks/supps as warranted  NUTRITION DIAGNOSIS: Inadequate oral intake related to poor appeite as evidenced by loss of 6 pounds in the last 3 months.   Goal: Pt to meet >/= 90% of their estimated nutrition needs   Monitor:  Oral intake, weight, labs  Reason for Assessment: MST 2  81 y.o. male  Admitting Dx: Hyponatremia  ASSESSMENT: 81 year old male with pmhx: Hypertension and COPD  Presents with complaint of chest pain, abdominal pain going on for past 3 days, decreased appetite and weight loss over the past 3 months. In the ED patient was found to have hyponatremia with sodium 107  Pt says he does not know why he has had not been eating well. He just has had a poor appetite. Pt did not follow any certain diet at home.   Pt reported his normal weight to be 154 lbs. This would indicate a loss of >6#s in the last 3 months.  Pt declined any snacks at this time  Nutrition Focused Physical Exam: No signs of muscle/fat wasting  Height: Ht Readings from Last 1 Encounters:  10/20/14 5\' 11"  (1.803 m)    Weight: Wt Readings from Last 1 Encounters:  10/20/14 148 lb 13 oz (67.5 kg)    Ideal Body Weight: 172 lbs  % Ideal Body Weight: 86%  Wt Readings from Last 10 Encounters:  10/20/14 148 lb 13 oz (67.5 kg)    Usual Body Weight: 154  % Usual Body Weight: 96%  BMI:  Body mass index is 20.76 kg/(m^2).  Estimated Nutritional Needs: Kcal: 1700-1900 (25-28 kcal/kg) Protein:70-81 (1-1.2 g/kg) Fluid: >2 liters  Skin: WDL  Diet Order: Diet Heart  EDUCATION NEEDS: -No education needs identified at this time   Intake/Output Summary (Last 24 hours) at 10/20/14 1414 Last data filed at 10/20/14 1200  Gross per 24 hour  Intake    480 ml  Output    1716 ml  Net  -1236 ml  Pt ate 70% of breakfast  Last BM: 3/9  Labs:   Recent Labs Lab 10/19/14 2140 10/20/14 0830  NA 107* 113*  K 3.2* 3.9  CL 69* 76*  CO2 24 24  BUN 15 16  CREATININE 1.07 1.16  CALCIUM 8.4 8.6  GLUCOSE 112* 104*    CBG (last 3)  No results for input(s): GLUCAP in the last 72 hours.  Scheduled Meds: . amLODipine  5 mg Oral Daily  . enoxaparin (LOVENOX) injection  40 mg Subcutaneous Q24H  . feeding supplement (ENSURE COMPLETE)  237 mL Oral BID BM  . sodium chloride  3 mL Intravenous Q12H  . tamsulosin  0.4 mg Oral Daily    Continuous Infusions: . sodium chloride 100 mL/hr at 10/20/14 1328    Past Medical History  Diagnosis Date  . Hypertension   . COPD (chronic obstructive pulmonary disease)     Past Surgical History  Procedure Laterality Date  . Back surgery     Burtis Junes RD, LDN Nutrition Pager: [phone] 10/20/2014 2:14 PM

## 2014-10-21 LAB — GLUCOSE, CAPILLARY: GLUCOSE-CAPILLARY: 104 mg/dL — AB (ref 70–99)

## 2014-10-21 LAB — BASIC METABOLIC PANEL
ANION GAP: 10 (ref 5–15)
BUN: 21 mg/dL (ref 6–23)
CO2: 24 mmol/L (ref 19–32)
Calcium: 9 mg/dL (ref 8.4–10.5)
Chloride: 88 mmol/L — ABNORMAL LOW (ref 96–112)
Creatinine, Ser: 1.29 mg/dL (ref 0.50–1.35)
GFR calc Af Amer: 59 mL/min — ABNORMAL LOW (ref >90)
GFR calc non Af Amer: 51 mL/min — ABNORMAL LOW (ref >90)
Glucose, Bld: 108 mg/dL — ABNORMAL HIGH (ref 70–99)
Potassium: 3.5 mmol/L (ref 3.5–5.1)
Sodium: 122 mmol/L — ABNORMAL LOW (ref 135–145)

## 2014-10-21 LAB — OSMOLALITY: Osmolality: 238 mOsm/kg — ABNORMAL LOW (ref 275–300)

## 2014-10-21 LAB — OSMOLALITY, URINE: Osmolality, Ur: 399 mOsm/kg (ref 390–1090)

## 2014-10-21 NOTE — Progress Notes (Signed)
Subjective: Patient is resting. He feels better. No new complaint. Objective: Vital signs in last 24 hours: Temp:  [97.4 F (36.3 C)-98.3 F (36.8 C)] 97.5 F (36.4 C) (03/12 0809) Pulse Rate:  [37-100] 89 (03/12 1000) Resp:  [13-20] 20 (03/12 1000) BP: (92-163)/(53-88) 97/76 mmHg (03/12 1000) SpO2:  [85 %-99 %] 96 % (03/12 1000) Weight:  [64.3 kg (141 lb 12.1 oz)] 64.3 kg (141 lb 12.1 oz) (03/12 0400) Weight change:  Last BM Date: 10/18/14  Intake/Output from previous day: 03/11 0701 - 03/12 0700 In: 3548.3 [P.O.:540; I.V.:3008.3] Out: 2900 [Urine:2900]  PHYSICAL EXAM General appearance: no distress and slowed mentation Resp: diminished breath sounds bilaterally and rhonchi bilaterally Cardio: S1, S2 normal GI: soft, non-tender; bowel sounds normal; no masses,  no organomegaly Extremities: extremities normal, atraumatic, no cyanosis or edema  Lab Results:  Results for orders placed or performed during the hospital encounter of 10/19/14 (from the past 48 hour(s))  CBC     Status: Abnormal   Collection Time: 10/19/14  9:40 PM  Result Value Ref Range   WBC 7.5 4.0 - 10.5 K/uL   RBC 4.46 4.22 - 5.81 MIL/uL   Hemoglobin 14.2 13.0 - 17.0 g/dL    Comment: CORRECTED FOR COLD AGGLUTININS   HCT 37.5 (L) 39.0 - 52.0 %   MCV 84.1 78.0 - 100.0 fL   MCH 31.8 26.0 - 34.0 pg    Comment: CORRECTED FOR COLD AGGLUTININS   MCHC 37.9 (H) 30.0 - 36.0 g/dL    Comment: CORRECTED FOR COLD AGGLUTININS   RDW 13.2 11.5 - 15.5 %   Platelets 178 150 - 400 K/uL  Basic metabolic panel     Status: Abnormal   Collection Time: 10/19/14  9:40 PM  Result Value Ref Range   Sodium 107 (LL) 135 - 145 mmol/L    Comment: RESULT REPEATED AND VERIFIED CRITICAL RESULT CALLED TO, READ BACK BY AND VERIFIED WITH: MOORE,S AT 2225 ON 10/18/2013 BY ISLEY,B    Potassium 3.2 (L) 3.5 - 5.1 mmol/L   Chloride 69 (L) 96 - 112 mmol/L   CO2 24 19 - 32 mmol/L   Glucose, Bld 112 (H) 70 - 99 mg/dL   BUN 15 6 - 23 mg/dL    Creatinine, Ser 1.07 0.50 - 1.35 mg/dL   Calcium 8.4 8.4 - 10.5 mg/dL   GFR calc non Af Amer 64 (L) >90 mL/min   GFR calc Af Amer 74 (L) >90 mL/min    Comment: (NOTE) The eGFR has been calculated using the CKD EPI equation. This calculation has not been validated in all clinical situations. eGFR's persistently <90 mL/min signify possible Chronic Kidney Disease.    Anion gap 14 5 - 15  BNP (order ONLY if patient complains of dyspnea/SOB AND you have documented it for THIS visit)     Status: None   Collection Time: 10/19/14  9:40 PM  Result Value Ref Range   B Natriuretic Peptide 36.0 0.0 - 100.0 pg/mL  Troponin I (MHP)     Status: None   Collection Time: 10/19/14  9:40 PM  Result Value Ref Range   Troponin I 0.03 <0.031 ng/mL    Comment:        NO INDICATION OF MYOCARDIAL INJURY.   TSH     Status: None   Collection Time: 10/19/14 10:00 PM  Result Value Ref Range   TSH 0.435 0.350 - 4.500 uIU/mL  Osmolality     Status: Abnormal   Collection Time: 10/19/14 10:00  PM  Result Value Ref Range   Osmolality 225 (L) 275 - 300 mOsm/kg    Comment: CRITICAL RESULT CALLED TO, READ BACK BY AND VERIFIED WITH: M.GRAY AT 1527 ON 10/20/14 BY S.VANHOORNE   Hepatic function panel     Status: Abnormal   Collection Time: 10/19/14 10:00 PM  Result Value Ref Range   Total Protein 6.7 6.0 - 8.3 g/dL   Albumin 4.0 3.5 - 5.2 g/dL   AST 39 (H) 0 - 37 U/L   ALT 19 0 - 53 U/L   Alkaline Phosphatase 75 39 - 117 U/L   Total Bilirubin 1.2 0.3 - 1.2 mg/dL   Bilirubin, Direct 0.3 0.0 - 0.5 mg/dL   Indirect Bilirubin 0.9 0.3 - 0.9 mg/dL  Creatinine, urine, random     Status: None   Collection Time: 10/19/14 11:50 PM  Result Value Ref Range   Creatinine, Urine 69.04 mg/dL  Sodium, urine, random     Status: None   Collection Time: 10/19/14 11:50 PM  Result Value Ref Range   Sodium, Ur 69 mmol/L  Osmolality, urine     Status: None   Collection Time: 10/19/14 11:50 PM  Result Value Ref Range    Osmolality, Ur 400 390 - 1090 mOsm/kg    Comment: Performed at [facility]  Troponin I     Status: None   Collection Time: 10/20/14 12:42 AM  Result Value Ref Range   Troponin I <0.03 <0.031 ng/mL    Comment:        NO INDICATION OF MYOCARDIAL INJURY.   CBC     Status: Abnormal   Collection Time: 10/20/14  8:30 AM  Result Value Ref Range   WBC 7.8 4.0 - 10.5 K/uL   RBC 4.81 4.22 - 5.81 MIL/uL   Hemoglobin 14.9 13.0 - 17.0 g/dL   HCT 65.9 96.7 - 99.6 %   MCV 83.2 78.0 - 100.0 fL   MCH 31.0 26.0 - 34.0 pg   MCHC 37.3 (H) 30.0 - 36.0 g/dL   RDW 15.4 32.0 - 80.7 %   Platelets 203 150 - 400 K/uL  Basic metabolic panel     Status: Abnormal   Collection Time: 10/20/14  8:30 AM  Result Value Ref Range   Sodium 113 (LL) 135 - 145 mmol/L    Comment: CRITICAL RESULT CALLED TO, READ BACK BY AND VERIFIED WITH: MCDANIEL,M AT 9:00AM ON 10/20/14 BY FESTERMAN,C    Potassium 3.9 3.5 - 5.1 mmol/L    Comment: DELTA CHECK NOTED   Chloride 76 (L) 96 - 112 mmol/L   CO2 24 19 - 32 mmol/L   Glucose, Bld 104 (H) 70 - 99 mg/dL   BUN 16 6 - 23 mg/dL   Creatinine, Ser 0.56 0.50 - 1.35 mg/dL   Calcium 8.6 8.4 - 32.1 mg/dL   GFR calc non Af Amer 58 (L) >90 mL/min   GFR calc Af Amer 67 (L) >90 mL/min    Comment: (NOTE) The eGFR has been calculated using the CKD EPI equation. This calculation has not been validated in all clinical situations. eGFR's persistently <90 mL/min signify possible Chronic Kidney Disease.    Anion gap 13 5 - 15  Troponin I     Status: None   Collection Time: 10/20/14  8:30 AM  Result Value Ref Range   Troponin I 0.03 <0.031 ng/mL    Comment:        NO INDICATION OF MYOCARDIAL INJURY.   Troponin I  Status: Abnormal   Collection Time: 10/20/14  1:01 PM  Result Value Ref Range   Troponin I 0.04 (H) <0.031 ng/mL    Comment:        PERSISTENTLY INCREASED TROPONIN VALUES IN THE RANGE OF 0.04-0.49 ng/mL CAN BE SEEN IN:       -UNSTABLE ANGINA        -CONGESTIVE HEART FAILURE       -MYOCARDITIS       -CHEST TRAUMA       -ARRYHTHMIAS       -LATE PRESENTING MYOCARDIAL INFARCTION       -COPD   CLINICAL FOLLOW-UP RECOMMENDED.   Osmolality     Status: Abnormal   Collection Time: 10/20/14  1:01 PM  Result Value Ref Range   Osmolality 238 (L) 275 - 300 mOsm/kg    Comment: Performed at [facility]  Urinalysis, Routine w reflex microscopic     Status: Abnormal   Collection Time: 10/20/14  3:15 PM  Result Value Ref Range   Color, Urine YELLOW YELLOW   APPearance CLEAR CLEAR   Specific Gravity, Urine <1.005 (L) 1.005 - 1.030   pH 6.0 5.0 - 8.0   Glucose, UA NEGATIVE NEGATIVE mg/dL   Hgb urine dipstick TRACE (A) NEGATIVE   Bilirubin Urine NEGATIVE NEGATIVE   Ketones, ur NEGATIVE NEGATIVE mg/dL   Protein, ur NEGATIVE NEGATIVE mg/dL   Urobilinogen, UA 1.0 0.0 - 1.0 mg/dL   Nitrite NEGATIVE NEGATIVE   Leukocytes, UA NEGATIVE NEGATIVE  Urine microscopic-add on     Status: None   Collection Time: 10/20/14  3:15 PM  Result Value Ref Range   Squamous Epithelial / LPF RARE RARE   WBC, UA 0-2 <3 WBC/hpf   RBC / HPF 0-2 <3 RBC/hpf   Bacteria, UA RARE RARE  Basic metabolic panel     Status: Abnormal   Collection Time: 10/20/14  3:22 PM  Result Value Ref Range   Sodium 115 (LL) 135 - 145 mmol/L    Comment: CRITICAL RESULT CALLED TO, READ BACK BY AND VERIFIED WITH: MCDANIEL,M ON 10/20/14 AT 1610 BY LOY,C    Potassium 4.1 3.5 - 5.1 mmol/L   Chloride 79 (L) 96 - 112 mmol/L   CO2 26 19 - 32 mmol/L   Glucose, Bld 105 (H) 70 - 99 mg/dL   BUN 21 6 - 23 mg/dL   Creatinine, Ser 1.27 0.50 - 1.35 mg/dL   Calcium 8.8 8.4 - 10.5 mg/dL   GFR calc non Af Amer 52 (L) >90 mL/min   GFR calc Af Amer 60 (L) >90 mL/min    Comment: (NOTE) The eGFR has been calculated using the CKD EPI equation. This calculation has not been validated in all clinical situations. eGFR's persistently <90 mL/min signify possible Chronic Kidney Disease.    Anion  gap 10 5 - 15  Basic metabolic panel     Status: Abnormal   Collection Time: 10/20/14  6:13 PM  Result Value Ref Range   Sodium 116 (LL) 135 - 145 mmol/L    Comment: CRITICAL RESULT CALLED TO, READ BACK BY AND VERIFIED WITH: HAMMOCK,S ON 10/20/14 AT 1920 BY LOY,C    Potassium 4.2 3.5 - 5.1 mmol/L   Chloride 81 (L) 96 - 112 mmol/L   CO2 25 19 - 32 mmol/L   Glucose, Bld 105 (H) 70 - 99 mg/dL   BUN 22 6 - 23 mg/dL   Creatinine, Ser 1.36 (H) 0.50 - 1.35 mg/dL   Calcium 8.8  8.4 - 10.5 mg/dL   GFR calc non Af Amer 48 (L) >90 mL/min   GFR calc Af Amer 55 (L) >90 mL/min    Comment: (NOTE) The eGFR has been calculated using the CKD EPI equation. This calculation has not been validated in all clinical situations. eGFR's persistently <90 mL/min signify possible Chronic Kidney Disease.    Anion gap 10 5 - 15  MRSA PCR Screening     Status: None   Collection Time: 10/20/14  8:40 PM  Result Value Ref Range   MRSA by PCR NEGATIVE NEGATIVE    Comment:        The GeneXpert MRSA Assay (FDA approved for NASAL specimens only), is one component of a comprehensive MRSA colonization surveillance program. It is not intended to diagnose MRSA infection nor to guide or monitor treatment for MRSA infections.   Basic metabolic panel     Status: Abnormal   Collection Time: 10/21/14  6:15 AM  Result Value Ref Range   Sodium 122 (L) 135 - 145 mmol/L    Comment: DELTA CHECK NOTED   Potassium 3.5 3.5 - 5.1 mmol/L   Chloride 88 (L) 96 - 112 mmol/L    Comment: DELTA CHECK NOTED   CO2 24 19 - 32 mmol/L   Glucose, Bld 108 (H) 70 - 99 mg/dL   BUN 21 6 - 23 mg/dL   Creatinine, Ser 1.29 0.50 - 1.35 mg/dL   Calcium 9.0 8.4 - 10.5 mg/dL   GFR calc non Af Amer 51 (L) >90 mL/min   GFR calc Af Amer 59 (L) >90 mL/min    Comment: (NOTE) The eGFR has been calculated using the CKD EPI equation. This calculation has not been validated in all clinical situations. eGFR's persistently <90 mL/min signify possible  Chronic Kidney Disease.    Anion gap 10 5 - 15    ABGS No results for input(s): PHART, PO2ART, TCO2, HCO3 in the last 72 hours.  Invalid input(s): PCO2 CULTURES Recent Results (from the past 240 hour(s))  MRSA PCR Screening     Status: None   Collection Time: 10/20/14  8:40 PM  Result Value Ref Range Status   MRSA by PCR NEGATIVE NEGATIVE Final    Comment:        The GeneXpert MRSA Assay (FDA approved for NASAL specimens only), is one component of a comprehensive MRSA colonization surveillance program. It is not intended to diagnose MRSA infection nor to guide or monitor treatment for MRSA infections.    Studies/Results: Dg Chest Port 1 View  10/19/2014   CLINICAL DATA:  Chest pain beginning today. Generalized abdominal pain for 3 days. Shortness of breath.  EXAM: PORTABLE CHEST - 1 VIEW  COMPARISON:  01/25/2013  FINDINGS: Hyperinflation suggesting emphysema. Probable scarring in the lung bases. The heart size and mediastinal contours are within normal limits. Both lungs are clear. The visualized skeletal structures are unremarkable.  IMPRESSION: Emphysematous changes in the lungs with scarring in the lung bases. No evidence of active disease.   Electronically Signed   By: Lucienne Capers M.D.   On: 10/19/2014 22:42    Medications: I have reviewed the patient's current medications.  Assesment:   Principal Problem:   Hyponatremia Active Problems:   Chest pain   HTN (hypertension)   COPD (chronic obstructive pulmonary disease)    Plan:  Medications reviewed Will continue  on normal saline Will monitor BMP daily Nephrology consult appreciated.    LOS: 2 days   [name] 10/21/2014, 10:30  AM   

## 2014-10-21 NOTE — Progress Notes (Signed)
Subjective: Interval History: has no complaint of dizziness or lightheadedness. Patient appetite is good and presently offers no complaints..  Objective: Vital signs in last 24 hours: Temp:  [97.4 F (36.3 C)-98.3 F (36.8 C)] 97.5 F (36.4 C) (03/12 0809) Pulse Rate:  [37-112] 85 (03/12 0600) Resp:  [13-19] 18 (03/12 0600) BP: (92-163)/(45-88) 97/53 mmHg (03/12 0600) SpO2:  [85 %-99 %] 96 % (03/12 0600) Weight:  [64.3 kg (141 lb 12.1 oz)] 64.3 kg (141 lb 12.1 oz) (03/12 0400) Weight change:   Intake/Output from previous day: 03/11 0701 - 03/12 0700 In: 3548.3 [P.O.:540; I.V.:3008.3] Out: 2900 [Urine:2900] Intake/Output this shift:    General appearance: alert, cooperative and no distress Resp: clear to auscultation bilaterally Cardio: regular rate and rhythm, S1, S2 normal, no murmur, click, rub or gallop GI: soft, non-tender; bowel sounds normal; no masses,  no organomegaly Extremities: extremities normal, atraumatic, no cyanosis or edema  Lab Results:  Recent Labs  10/19/14 2140 10/20/14 0830  WBC 7.5 7.8  HGB 14.2 14.9  HCT 37.5* 40.0  PLT 178 203   BMET:  Recent Labs  10/20/14 1813 10/21/14 0615  NA 116* 122*  K 4.2 3.5  CL 81* 88*  CO2 25 24  GLUCOSE 105* 108*  BUN 22 21  CREATININE 1.36* 1.29  CALCIUM 8.8 9.0   No results for input(s): PTH in the last 72 hours. Iron Studies: No results for input(s): IRON, TIBC, TRANSFERRIN, FERRITIN in the last 72 hours.  Studies/Results: Dg Chest Port 1 View  10/19/2014   CLINICAL DATA:  Chest pain beginning today. Generalized abdominal pain for 3 days. Shortness of breath.  EXAM: PORTABLE CHEST - 1 VIEW  COMPARISON:  01/25/2013  FINDINGS: Hyperinflation suggesting emphysema. Probable scarring in the lung bases. The heart size and mediastinal contours are within normal limits. Both lungs are clear. The visualized skeletal structures are unremarkable.  IMPRESSION: Emphysematous changes in the lungs with scarring in  the lung bases. No evidence of active disease.   Electronically Signed   By: Lucienne Capers M.D.   On: 10/19/2014 22:42    I have reviewed the patient's current medications.  Assessment/Plan: Problem #1 hyponatremia: This is thought to be secondary to hypovolemic hyponatremia related to HCTZ. His sodium presently has improved progressively to 122. Presently patient is on normal saline and Lasix. Problem #2 hypokalemia: His potassium is 3.5 has corrected Problem #3 acute kidney injury most likely prerenal. His creatinine has improved. Problem #4 hypertension: His blood pressure is reasonably controlled Problem #5 history of COPD Problem #6 chest pain: Presently patient is asymptomatic Problem #7 history of BPH Plan: We'll DC Lasix We'll continue his hydration We'll check his basic metabolic panel in the morning.     LOS: 2 days   [name] S 10/21/2014,8:30 AM

## 2014-10-22 LAB — BASIC METABOLIC PANEL
Anion gap: 9 (ref 5–15)
BUN: 21 mg/dL (ref 6–23)
CO2: 24 mmol/L (ref 19–32)
CREATININE: 1.06 mg/dL (ref 0.50–1.35)
Calcium: 8.7 mg/dL (ref 8.4–10.5)
Chloride: 93 mmol/L — ABNORMAL LOW (ref 96–112)
GFR calc Af Amer: 75 mL/min — ABNORMAL LOW (ref >90)
GFR calc non Af Amer: 65 mL/min — ABNORMAL LOW (ref >90)
Glucose, Bld: 106 mg/dL — ABNORMAL HIGH (ref 70–99)
POTASSIUM: 3.2 mmol/L — AB (ref 3.5–5.1)
Sodium: 126 mmol/L — ABNORMAL LOW (ref 135–145)

## 2014-10-22 LAB — GLUCOSE, CAPILLARY: Glucose-Capillary: 108 mg/dL — ABNORMAL HIGH (ref 70–99)

## 2014-10-22 MED ORDER — POTASSIUM CHLORIDE CRYS ER 20 MEQ PO TBCR
40.0000 meq | EXTENDED_RELEASE_TABLET | Freq: Two times a day (BID) | ORAL | Status: AC
  Administered 2014-10-22 (×2): 40 meq via ORAL
  Filled 2014-10-22 (×2): qty 2

## 2014-10-22 NOTE — Progress Notes (Signed)
Subjective: Patient feels better. His sodium is improving. He more alert and awake. Objective: Vital signs in last 24 hours: Temp:  [97.6 F (36.4 C)-98 F (36.7 C)] 97.6 F (36.4 C) (03/13 0724) Pulse Rate:  [57-90] 90 (03/13 0724) Resp:  [18-21] 20 (03/13 0724) BP: (97-169)/(59-99) 146/59 mmHg (03/13 0724) SpO2:  [96 %-100 %] 99 % (03/13 0724) Weight change:  Last BM Date: 10/18/14  Intake/Output from previous day: 03/12 0701 - 03/13 0700 In: 78 [P.O.:120; I.V.:700] Out: 500 [Urine:500]  PHYSICAL EXAM General appearance: no distress and slowed mentation Resp: diminished breath sounds bilaterally and rhonchi bilaterally Cardio: S1, S2 normal GI: soft, non-tender; bowel sounds normal; no masses,  no organomegaly Extremities: extremities normal, atraumatic, no cyanosis or edema  Lab Results:  Results for orders placed or performed during the hospital encounter of 10/19/14 (from the past 48 hour(s))  Troponin I     Status: Abnormal   Collection Time: 10/20/14  1:01 PM  Result Value Ref Range   Troponin I 0.04 (H) <0.031 ng/mL    Comment:        PERSISTENTLY INCREASED TROPONIN VALUES IN THE RANGE OF 0.04-0.49 ng/mL CAN BE SEEN IN:       -UNSTABLE ANGINA       -CONGESTIVE HEART FAILURE       -MYOCARDITIS       -CHEST TRAUMA       -ARRYHTHMIAS       -LATE PRESENTING MYOCARDIAL INFARCTION       -COPD   CLINICAL FOLLOW-UP RECOMMENDED.   Osmolality     Status: Abnormal   Collection Time: 10/20/14  1:01 PM  Result Value Ref Range   Osmolality 238 (L) 275 - 300 mOsm/kg    Comment: Performed at [facility]  Urinalysis, Routine w reflex microscopic     Status: Abnormal   Collection Time: 10/20/14  3:15 PM  Result Value Ref Range   Color, Urine YELLOW YELLOW   APPearance CLEAR CLEAR   Specific Gravity, Urine <1.005 (L) 1.005 - 1.030   pH 6.0 5.0 - 8.0   Glucose, UA NEGATIVE NEGATIVE mg/dL   Hgb urine dipstick TRACE (A) NEGATIVE   Bilirubin Urine NEGATIVE  NEGATIVE   Ketones, ur NEGATIVE NEGATIVE mg/dL   Protein, ur NEGATIVE NEGATIVE mg/dL   Urobilinogen, UA 1.0 0.0 - 1.0 mg/dL   Nitrite NEGATIVE NEGATIVE   Leukocytes, UA NEGATIVE NEGATIVE  Urine microscopic-add on     Status: None   Collection Time: 10/20/14  3:15 PM  Result Value Ref Range   Squamous Epithelial / LPF RARE RARE   WBC, UA 0-2 <3 WBC/hpf   RBC / HPF 0-2 <3 RBC/hpf   Bacteria, UA RARE RARE  Basic metabolic panel     Status: Abnormal   Collection Time: 10/20/14  3:22 PM  Result Value Ref Range   Sodium 115 (LL) 135 - 145 mmol/L    Comment: CRITICAL RESULT CALLED TO, READ BACK BY AND VERIFIED WITH: MCDANIEL,M ON 10/20/14 AT 1610 BY LOY,C    Potassium 4.1 3.5 - 5.1 mmol/L   Chloride 79 (L) 96 - 112 mmol/L   CO2 26 19 - 32 mmol/L   Glucose, Bld 105 (H) 70 - 99 mg/dL   BUN 21 6 - 23 mg/dL   Creatinine, Ser 1.27 0.50 - 1.35 mg/dL   Calcium 8.8 8.4 - 10.5 mg/dL   GFR calc non Af Amer 52 (L) >90 mL/min   GFR calc Af Amer 60 (L) >90  mL/min    Comment: (NOTE) The eGFR has been calculated using the CKD EPI equation. This calculation has not been validated in all clinical situations. eGFR's persistently <90 mL/min signify possible Chronic Kidney Disease.    Anion gap 10 5 - 15  Osmolality, urine     Status: None   Collection Time: 10/20/14  3:55 PM  Result Value Ref Range   Osmolality, Ur 399 390 - 1090 mOsm/kg    Comment: Performed at [facility]     Status: Abnormal   Collection Time: 10/20/14  6:13 PM  Result Value Ref Range   Sodium 116 (LL) 135 - 145 mmol/L    Comment: CRITICAL RESULT CALLED TO, READ BACK BY AND VERIFIED WITH: HAMMOCK,S ON 10/20/14 AT 1920 BY LOY,C    Potassium 4.2 3.5 - 5.1 mmol/L   Chloride 81 (L) 96 - 112 mmol/L   CO2 25 19 - 32 mmol/L   Glucose, Bld 105 (H) 70 - 99 mg/dL   BUN 22 6 - 23 mg/dL   Creatinine, Ser 1.36 (H) 0.50 - 1.35 mg/dL   Calcium 8.8 8.4 - 10.5 mg/dL   GFR calc non Af Amer 48 (L) >90  mL/min   GFR calc Af Amer 55 (L) >90 mL/min    Comment: (NOTE) The eGFR has been calculated using the CKD EPI equation. This calculation has not been validated in all clinical situations. eGFR's persistently <90 mL/min signify possible Chronic Kidney Disease.    Anion gap 10 5 - 15  MRSA PCR Screening     Status: None   Collection Time: 10/20/14  8:40 PM  Result Value Ref Range   MRSA by PCR NEGATIVE NEGATIVE    Comment:        The GeneXpert MRSA Assay (FDA approved for NASAL specimens only), is one component of a comprehensive MRSA colonization surveillance program. It is not intended to diagnose MRSA infection nor to guide or monitor treatment for MRSA infections.   Basic metabolic panel     Status: Abnormal   Collection Time: 10/21/14  6:15 AM  Result Value Ref Range   Sodium 122 (L) 135 - 145 mmol/L    Comment: DELTA CHECK NOTED   Potassium 3.5 3.5 - 5.1 mmol/L   Chloride 88 (L) 96 - 112 mmol/L    Comment: DELTA CHECK NOTED   CO2 24 19 - 32 mmol/L   Glucose, Bld 108 (H) 70 - 99 mg/dL   BUN 21 6 - 23 mg/dL   Creatinine, Ser 1.29 0.50 - 1.35 mg/dL   Calcium 9.0 8.4 - 10.5 mg/dL   GFR calc non Af Amer 51 (L) >90 mL/min   GFR calc Af Amer 59 (L) >90 mL/min    Comment: (NOTE) The eGFR has been calculated using the CKD EPI equation. This calculation has not been validated in all clinical situations. eGFR's persistently <90 mL/min signify possible Chronic Kidney Disease.    Anion gap 10 5 - 15  Glucose, capillary     Status: Abnormal   Collection Time: 10/21/14  9:32 PM  Result Value Ref Range   Glucose-Capillary 104 (H) 70 - 99 mg/dL   Comment 1 Notify RN   Basic metabolic panel     Status: Abnormal   Collection Time: 10/22/14  7:08 AM  Result Value Ref Range   Sodium 126 (L) 135 - 145 mmol/L   Potassium 3.2 (L) 3.5 - 5.1 mmol/L   Chloride 93 (L) 96 - 112  mmol/L   CO2 24 19 - 32 mmol/L   Glucose, Bld 106 (H) 70 - 99 mg/dL   BUN 21 6 - 23 mg/dL   Creatinine,  Ser 1.06 0.50 - 1.35 mg/dL   Calcium 8.7 8.4 - 10.5 mg/dL   GFR calc non Af Amer 65 (L) >90 mL/min   GFR calc Af Amer 75 (L) >90 mL/min    Comment: (NOTE) The eGFR has been calculated using the CKD EPI equation. This calculation has not been validated in all clinical situations. eGFR's persistently <90 mL/min signify possible Chronic Kidney Disease.    Anion gap 9 5 - 15  Glucose, capillary     Status: Abnormal   Collection Time: 10/22/14  7:19 AM  Result Value Ref Range   Glucose-Capillary 108 (H) 70 - 99 mg/dL   Comment 1 Notify RN    Comment 2 Document in Chart     ABGS No results for input(s): PHART, PO2ART, TCO2, HCO3 in the last 72 hours.  Invalid input(s): PCO2 CULTURES Recent Results (from the past 240 hour(s))  MRSA PCR Screening     Status: None   Collection Time: 10/20/14  8:40 PM  Result Value Ref Range Status   MRSA by PCR NEGATIVE NEGATIVE Final    Comment:        The GeneXpert MRSA Assay (FDA approved for NASAL specimens only), is one component of a comprehensive MRSA colonization surveillance program. It is not intended to diagnose MRSA infection nor to guide or monitor treatment for MRSA infections.    Studies/Results: No results found.  Medications: I have reviewed the patient's current medications.  Assesment:   Principal Problem:   Hyponatremia Active Problems:   Chest pain   HTN (hypertension)   COPD (chronic obstructive pulmonary disease)    Plan:  Medications reviewed Will monitor BMP daily K+ supplement As per nephrology recommendation    LOS: 3 days   Rockelle Heuerman 10/22/2014, 9:55 AM

## 2014-10-22 NOTE — Plan of Care (Signed)
Problem: Phase I Progression Outcomes Goal: Voiding-avoid urinary catheter unless indicated Outcome: Not Met (add Reason) Condom Cath

## 2014-10-22 NOTE — Progress Notes (Signed)
Subjective: Interval History: Patient offers no complaints. Presently he denies any nausea or vomiting. Patient also denies any difficulty in breathing. Overall is feeling much better.  Objective: Vital signs in last 24 hours: Temp:  [97.6 F (36.4 C)-98 F (36.7 C)] 97.6 F (36.4 C) (03/13 0724) Pulse Rate:  [57-90] 90 (03/13 0724) Resp:  [18-21] 20 (03/13 0724) BP: (97-169)/(59-99) 146/59 mmHg (03/13 0724) SpO2:  [96 %-100 %] 99 % (03/13 0724) Weight change:   Intake/Output from previous day: 03/12 0701 - 03/13 0700 In: 51 [P.O.:120; I.V.:700] Out: 500 [Urine:500] Intake/Output this shift:    General appearance: alert, cooperative and no distress Resp: clear to auscultation bilaterally Cardio: regular rate and rhythm, S1, S2 normal, no murmur, click, rub or gallop GI: soft, non-tender; bowel sounds normal; no masses,  no organomegaly Extremities: extremities normal, atraumatic, no cyanosis or edema  Lab Results:  Recent Labs  10/19/14 2140 10/20/14 0830  WBC 7.5 7.8  HGB 14.2 14.9  HCT 37.5* 40.0  PLT 178 203   BMET:   Recent Labs  10/21/14 0615 10/22/14 0708  NA 122* 126*  K 3.5 3.2*  CL 88* 93*  CO2 24 24  GLUCOSE 108* 106*  BUN 21 21  CREATININE 1.29 1.06  CALCIUM 9.0 8.7   No results for input(s): PTH in the last 72 hours. Iron Studies: No results for input(s): IRON, TIBC, TRANSFERRIN, FERRITIN in the last 72 hours.  Studies/Results: No results found.  I have reviewed the patient's current medications.  Assessment/Plan: Problem #1 hyponatremia: This is thought to be secondary to hypovolemic hyponatremia and his sodium is 126 has improved. Problem #2 hypokalemia: His potassium is 3.2 has declined. Problem #3 acute kidney injury most likely prerenal. His renal function has recovered. Problem #4 hypertension: His blood pressure is reasonably controlled Problem #5 history of COPD Problem #6 chest pain: Presently patient is asymptomatic Problem  #7 history of BPH Plan: We'll KCl 40 mEq 2. We'll DC his IV fluid. We'll check his basic metabolic panel in the morning.     LOS: 3 days   Dim Meisinger S 10/22/2014,9:03 AM

## 2014-10-23 LAB — BASIC METABOLIC PANEL
ANION GAP: 3 — AB (ref 5–15)
BUN: 22 mg/dL (ref 6–23)
CO2: 27 mmol/L (ref 19–32)
CREATININE: 1.1 mg/dL (ref 0.50–1.35)
Calcium: 8.7 mg/dL (ref 8.4–10.5)
Chloride: 98 mmol/L (ref 96–112)
GFR calc Af Amer: 72 mL/min — ABNORMAL LOW (ref >90)
GFR, EST NON AFRICAN AMERICAN: 62 mL/min — AB (ref >90)
Glucose, Bld: 108 mg/dL — ABNORMAL HIGH (ref 70–99)
Potassium: 4 mmol/L (ref 3.5–5.1)
SODIUM: 128 mmol/L — AB (ref 135–145)

## 2014-10-23 MED ORDER — VALSARTAN 160 MG PO TABS: 160.0000 mg | ORAL_TABLET | Freq: Every day | ORAL | Status: DC

## 2014-10-23 NOTE — Discharge Summary (Signed)
Physician Discharge Summary  Patient ID: [name] MRN: [id number] DOB/AGE: March 07, 1933 81 y.o. Primary Care Physician:Mykal Batiz, MD Admit date: 10/19/2014 Discharge date: 10/23/2014    Discharge Diagnoses:   Principal Problem:   Hyponatremia Active Problems:   Chest pain   HTN (hypertension)   COPD (chronic obstructive pulmonary disease)     Medication List    STOP taking these medications        predniSONE 20 MG tablet  Commonly known as:  DELTASONE     valsartan-hydrochlorothiazide 160-25 MG per tablet  Commonly known as:  DIOVAN-HCT      TAKE these medications        albuterol 108 (90 BASE) MCG/ACT inhaler  Commonly known as:  PROVENTIL HFA;VENTOLIN HFA  Inhale 2 puffs into the lungs every 6 (six) hours as needed for wheezing.     albuterol (2.5 MG/3ML) 0.083% nebulizer solution  Commonly known as:  PROVENTIL  Take 3 mLs (2.5 mg total) by nebulization every 4 (four) hours as needed for wheezing.     amLODipine 5 MG tablet  Commonly known as:  NORVASC  Take 5 mg by mouth daily.     tamsulosin 0.4 MG Caps capsule  Commonly known as:  FLOMAX  Take 1 capsule by mouth daily.     valsartan 160 MG tablet  Commonly known as:  DIOVAN  Take 1 tablet (160 mg total) by mouth daily.        Discharged Condition: improved    Consults: nephrology  Significant Diagnostic Studies: Dg Chest Port 1 View  10/19/2014   CLINICAL DATA:  Chest pain beginning today. Generalized abdominal pain for 3 days. Shortness of breath.  EXAM: PORTABLE CHEST - 1 VIEW  COMPARISON:  01/25/2013  FINDINGS: Hyperinflation suggesting emphysema. Probable scarring in the lung bases. The heart size and mediastinal contours are within normal limits. Both lungs are clear. The visualized skeletal structures are unremarkable.  IMPRESSION: Emphysematous changes in the lungs with scarring in the lung bases. No evidence of active disease.   Electronically Signed   By: Lucienne Capers M.D.   On:  10/19/2014 22:42    Lab Results: Basic Metabolic Panel:  Recent Labs  10/22/14 0708 10/23/14 0615  NA 126* 128*  K 3.2* 4.0  CL 93* 98  CO2 24 27  GLUCOSE 106* 108*  BUN 21 22  CREATININE 1.06 1.10  CALCIUM 8.7 8.7   Liver Function Tests: No results for input(s): AST, ALT, ALKPHOS, BILITOT, PROT, ALBUMIN in the last 72 hours.   CBC:  Recent Labs  10/20/14 0830  WBC 7.8  HGB 14.9  HCT 40.0  MCV 83.2  PLT 203    Recent Results (from the past 240 hour(s))  MRSA PCR Screening     Status: None   Collection Time: 10/20/14  8:40 PM  Result Value Ref Range Status   MRSA by PCR NEGATIVE NEGATIVE Final    Comment:        The GeneXpert MRSA Assay (FDA approved for NASAL specimens only), is one component of a comprehensive MRSA colonization surveillance program. It is not intended to diagnose MRSA infection nor to guide or monitor treatment for MRSA infections.      Hospital Course:   This is a 81 years old male with history of multiple mediascal illnesses was admitted due to generalized weakness and change in mental status. Patient was found to have severe hyponatremia. He was given 3% saline and his sodium gradually improved. Patient was seen by  nephrologist. Patient's diuretics discontinued. On discharge his sodium is 128. He will be followed in the office in 2 weeks.  Discharge Exam: Blood pressure 125/62, pulse 82, temperature 98.2 F (36.8 C), temperature source Oral, resp. rate 20, height 5\' 11"  (1.803 m), weight 64.3 kg (141 lb 12.1 oz), SpO2 96 %.    Disposition:  home        Follow-up Information    Follow up with [facility], [place] In 2 weeks.   Specialty:  Internal Medicine   Contact information:   [place] [address] [phone]       Signed: Rosita Fire   10/23/2014, 8:22 AM

## 2014-10-23 NOTE — Progress Notes (Signed)
Subjective: Interval History: Patient offers no complaints. Presently feeling good and he doesn't have any weakness.  Objective: Vital signs in last 24 hours: Temp:  [98.2 F (36.8 C)] 98.2 F (36.8 C) (03/14 0545) Pulse Rate:  [82-93] 82 (03/14 0545) Resp:  [20] 20 (03/14 0545) BP: (125-191)/(62-90) 125/62 mmHg (03/14 0545) SpO2:  [96 %-97 %] 96 % (03/14 0545) Weight change:   Intake/Output from previous day: 03/13 0701 - 03/14 0700 In: 720 [P.O.:720] Out: 850 [Urine:850] Intake/Output this shift:    General appearance: alert, cooperative and no distress Resp: clear to auscultation bilaterally Cardio: regular rate and rhythm, S1, S2 normal, no murmur, click, rub or gallop GI: soft, non-tender; bowel sounds normal; no masses,  no organomegaly Extremities: extremities normal, atraumatic, no cyanosis or edema  Lab Results: No results for input(s): WBC, HGB, HCT, PLT in the last 72 hours. BMET:   Recent Labs  10/22/14 0708 10/23/14 0615  NA 126* 128*  K 3.2* 4.0  CL 93* 98  CO2 24 27  GLUCOSE 106* 108*  BUN 21 22  CREATININE 1.06 1.10  CALCIUM 8.7 8.7   No results for input(s): PTH in the last 72 hours. Iron Studies: No results for input(s): IRON, TIBC, TRANSFERRIN, FERRITIN in the last 72 hours.  Studies/Results: No results found.  I have reviewed the patient's current medications.  Assessment/Plan: Problem #1 hyponatremia: This is thought to be secondary to hypovolemic hyponatremia and his sodium is 128 and progressively improving. Patient is asymptomatic. Problem #2 hypokalemia: His potassium has corrected. Problem #3 acute kidney injury most likely prerenal. His renal function has recovered. Problem #4 hypertension: His blood pressure is reasonably controlled Problem #5 history of COPD Problem #6 chest pain: Presently patient is asymptomatic Problem #7 history of BPH Plan: We'll continue his present management We'll see patient in 4 weeks as an  outpatient.     LOS: 4 days   Anab Vivar S 10/23/2014,9:01 AM

## 2014-10-23 NOTE — Progress Notes (Signed)
NURSING PROGRESS NOTE  CORRIGAN KRETSCHMER [id number] Discharge Data: 10/23/2014 11:56 AM Attending Provider: No att. providers found YJW:LKHVF,MBBUYZJ, MD   Baxter Hire Barrasso to be D/C'd Home per MD order.    All IV's discontinued and monitored for bleeding.  All belongings returned to patient for patient to take home.  AVS summary and prescription reviewed with patient.  Patient left floor via wheelchair, escorted by patient advocate.  Last Documented Vital Signs:  Blood pressure 125/62, pulse 82, temperature 98.2 F (36.8 C), temperature source Oral, resp. rate 20, height 5\' 11"  (1.803 m), weight 64.3 kg (141 lb 12.1 oz), SpO2 96 %.  Cecilie Kicks D

## 2014-10-23 NOTE — Discharge Instructions (Signed)
Hyponatremia  °Hyponatremia is when the amount of salt (sodium) in your blood is too low. When sodium levels are low, your cells will absorb extra water and swell. The swelling happens throughout the body, but it mostly affects the brain. Severe brain swelling (cerebral edema), seizures, or coma can happen.  °CAUSES  °· Heart, kidney, or liver problems. °· Thyroid problems. °· Adrenal gland problems. °· Severe vomiting and diarrhea. °· Certain medicines or illegal drugs. °· Dehydration. °· Drinking too much water. °· Low-sodium diet. °SYMPTOMS  °· Nausea and vomiting. °· Confusion. °· Lethargy. °· Agitation. °· Headache. °· Twitching or shaking (seizures). °· Unconsciousness. °· Appetite loss. °· Muscle weakness and cramping. °DIAGNOSIS  °Hyponatremia is identified by a simple blood test. Your caregiver will perform a history and physical exam to try to find the cause and type of hyponatremia. Other tests may be needed to measure the amount of sodium in your blood and urine. °TREATMENT  °Treatment will depend on the cause.  °· Fluids may be given through the vein (IV). °· Medicines may be used to correct the sodium imbalance. If medicines are causing the problem, they will need to be adjusted. °· Water or fluid intake may be restricted to restore proper balance. °The speed of correcting the sodium problem is very important. If the problem is corrected too fast, nerve damage (sometimes unchangeable) can happen. °HOME CARE INSTRUCTIONS  °· Only take medicines as directed by your caregiver. Many medicines can make hyponatremia worse. Discuss all your medicines with your caregiver. °· Carefully follow any recommended diet, including any fluid restrictions. °· You may be asked to repeat lab tests. Follow these directions. °· Avoid alcohol and recreational drugs. °SEEK MEDICAL CARE IF:  °· You develop worsening nausea, fatigue, headache, confusion, or weakness. °· Your original hyponatremia symptoms return. °· You have  problems following the recommended diet. °SEEK IMMEDIATE MEDICAL CARE IF:  °· You have a seizure. °· You faint. °· You have ongoing diarrhea or vomiting. °MAKE SURE YOU:  °· Understand these instructions. °· Will watch your condition. °· Will get help right away if you are not doing well or get worse. °Document Released: 07/18/2002 Document Revised: 10/20/2011 Document Reviewed: 01/12/2011 °[employer]® Patient Information ©2015 [employer]. This information is not intended to replace advice given to you by your health care provider. Make sure you discuss any questions you have with your health care provider. ° °

## 2014-10-23 NOTE — Progress Notes (Signed)
UR chart review completed.  

## 2014-11-06 DIAGNOSIS — K861 Other chronic pancreatitis: Secondary | ICD-10-CM | POA: Diagnosis not present

## 2014-11-06 DIAGNOSIS — F17200 Nicotine dependence, unspecified, uncomplicated: Secondary | ICD-10-CM | POA: Diagnosis not present

## 2014-11-06 DIAGNOSIS — N400 Enlarged prostate without lower urinary tract symptoms: Secondary | ICD-10-CM | POA: Diagnosis not present

## 2014-11-06 DIAGNOSIS — J449 Chronic obstructive pulmonary disease, unspecified: Secondary | ICD-10-CM | POA: Diagnosis not present

## 2014-11-06 DIAGNOSIS — I10 Essential (primary) hypertension: Secondary | ICD-10-CM | POA: Diagnosis not present

## 2014-12-04 DIAGNOSIS — I10 Essential (primary) hypertension: Secondary | ICD-10-CM | POA: Diagnosis not present

## 2014-12-04 DIAGNOSIS — J449 Chronic obstructive pulmonary disease, unspecified: Secondary | ICD-10-CM | POA: Diagnosis not present

## 2014-12-04 DIAGNOSIS — E871 Hypo-osmolality and hyponatremia: Secondary | ICD-10-CM | POA: Diagnosis not present

## 2014-12-04 DIAGNOSIS — N182 Chronic kidney disease, stage 2 (mild): Secondary | ICD-10-CM | POA: Diagnosis not present

## 2014-12-28 DIAGNOSIS — J449 Chronic obstructive pulmonary disease, unspecified: Secondary | ICD-10-CM | POA: Diagnosis not present

## 2014-12-28 DIAGNOSIS — Z720 Tobacco use: Secondary | ICD-10-CM | POA: Diagnosis not present

## 2014-12-28 DIAGNOSIS — N400 Enlarged prostate without lower urinary tract symptoms: Secondary | ICD-10-CM | POA: Diagnosis not present

## 2014-12-28 DIAGNOSIS — F17200 Nicotine dependence, unspecified, uncomplicated: Secondary | ICD-10-CM | POA: Diagnosis not present

## 2014-12-28 DIAGNOSIS — I10 Essential (primary) hypertension: Secondary | ICD-10-CM | POA: Diagnosis not present

## 2015-01-23 ENCOUNTER — Ambulatory Visit (HOSPITAL_COMMUNITY)
Admission: RE | Admit: 2015-01-23 | Discharge: 2015-01-23 | Disposition: A | Discharge status: Home / Self Care | Payer: Medicare Other | Source: Ambulatory Visit | Attending: Internal Medicine | Admitting: Internal Medicine

## 2015-01-23 ENCOUNTER — Other Ambulatory Visit (HOSPITAL_COMMUNITY): Payer: Self-pay | Admitting: Internal Medicine

## 2015-01-23 DIAGNOSIS — M25512 Pain in left shoulder: Secondary | ICD-10-CM | POA: Diagnosis not present

## 2015-03-29 DIAGNOSIS — J449 Chronic obstructive pulmonary disease, unspecified: Secondary | ICD-10-CM | POA: Diagnosis not present

## 2015-03-29 DIAGNOSIS — M25512 Pain in left shoulder: Secondary | ICD-10-CM | POA: Diagnosis not present

## 2015-03-29 DIAGNOSIS — F17200 Nicotine dependence, unspecified, uncomplicated: Secondary | ICD-10-CM | POA: Diagnosis not present

## 2015-04-02 DIAGNOSIS — Z23 Encounter for immunization: Secondary | ICD-10-CM | POA: Diagnosis not present

## 2015-04-02 DIAGNOSIS — M25512 Pain in left shoulder: Secondary | ICD-10-CM | POA: Diagnosis not present

## 2015-04-02 DIAGNOSIS — J449 Chronic obstructive pulmonary disease, unspecified: Secondary | ICD-10-CM | POA: Diagnosis not present

## 2015-04-26 DIAGNOSIS — Z79899 Other long term (current) drug therapy: Secondary | ICD-10-CM | POA: Diagnosis not present

## 2015-04-26 DIAGNOSIS — D509 Iron deficiency anemia, unspecified: Secondary | ICD-10-CM | POA: Diagnosis not present

## 2015-04-26 DIAGNOSIS — R809 Proteinuria, unspecified: Secondary | ICD-10-CM | POA: Diagnosis not present

## 2015-04-26 DIAGNOSIS — I10 Essential (primary) hypertension: Secondary | ICD-10-CM | POA: Diagnosis not present

## 2015-04-26 DIAGNOSIS — N183 Chronic kidney disease, stage 3 (moderate): Secondary | ICD-10-CM | POA: Diagnosis not present

## 2015-04-26 DIAGNOSIS — D649 Anemia, unspecified: Secondary | ICD-10-CM | POA: Diagnosis not present

## 2015-04-26 DIAGNOSIS — E559 Vitamin D deficiency, unspecified: Secondary | ICD-10-CM | POA: Diagnosis not present

## 2015-06-21 DIAGNOSIS — Z23 Encounter for immunization: Secondary | ICD-10-CM | POA: Diagnosis not present

## 2015-06-21 DIAGNOSIS — I10 Essential (primary) hypertension: Secondary | ICD-10-CM | POA: Diagnosis not present

## 2015-06-21 DIAGNOSIS — F17200 Nicotine dependence, unspecified, uncomplicated: Secondary | ICD-10-CM | POA: Diagnosis not present

## 2015-06-21 DIAGNOSIS — J449 Chronic obstructive pulmonary disease, unspecified: Secondary | ICD-10-CM | POA: Diagnosis not present

## 2015-06-21 DIAGNOSIS — M545 Low back pain: Secondary | ICD-10-CM | POA: Diagnosis not present

## 2015-07-04 DIAGNOSIS — I10 Essential (primary) hypertension: Secondary | ICD-10-CM | POA: Diagnosis not present

## 2015-10-04 DIAGNOSIS — J449 Chronic obstructive pulmonary disease, unspecified: Secondary | ICD-10-CM | POA: Diagnosis not present

## 2015-10-04 DIAGNOSIS — I10 Essential (primary) hypertension: Secondary | ICD-10-CM | POA: Diagnosis not present

## 2015-10-04 DIAGNOSIS — N400 Enlarged prostate without lower urinary tract symptoms: Secondary | ICD-10-CM | POA: Diagnosis not present

## 2015-10-04 DIAGNOSIS — F17200 Nicotine dependence, unspecified, uncomplicated: Secondary | ICD-10-CM | POA: Diagnosis not present

## 2015-11-14 DIAGNOSIS — Z79899 Other long term (current) drug therapy: Secondary | ICD-10-CM | POA: Diagnosis not present

## 2015-11-14 DIAGNOSIS — R809 Proteinuria, unspecified: Secondary | ICD-10-CM | POA: Diagnosis not present

## 2015-11-14 DIAGNOSIS — I10 Essential (primary) hypertension: Secondary | ICD-10-CM | POA: Diagnosis not present

## 2015-11-14 DIAGNOSIS — N183 Chronic kidney disease, stage 3 (moderate): Secondary | ICD-10-CM | POA: Diagnosis not present

## 2015-11-14 DIAGNOSIS — E559 Vitamin D deficiency, unspecified: Secondary | ICD-10-CM | POA: Diagnosis not present

## 2015-11-14 DIAGNOSIS — D509 Iron deficiency anemia, unspecified: Secondary | ICD-10-CM | POA: Diagnosis not present

## 2016-01-03 DIAGNOSIS — J449 Chronic obstructive pulmonary disease, unspecified: Secondary | ICD-10-CM | POA: Diagnosis not present

## 2016-01-03 DIAGNOSIS — M545 Low back pain: Secondary | ICD-10-CM | POA: Diagnosis not present

## 2016-01-03 DIAGNOSIS — I10 Essential (primary) hypertension: Secondary | ICD-10-CM | POA: Diagnosis not present

## 2016-01-03 DIAGNOSIS — F17200 Nicotine dependence, unspecified, uncomplicated: Secondary | ICD-10-CM | POA: Diagnosis not present

## 2016-03-06 ENCOUNTER — Encounter (HOSPITAL_COMMUNITY): Payer: Self-pay | Admitting: Emergency Medicine

## 2016-03-06 ENCOUNTER — Emergency Department (HOSPITAL_COMMUNITY): Payer: Medicare Other

## 2016-03-06 ENCOUNTER — Emergency Department (HOSPITAL_COMMUNITY)
Admission: EM | Admit: 2016-03-06 | Discharge: 2016-03-06 | Disposition: A | Discharge status: Home / Self Care | Payer: Medicare Other | Attending: Emergency Medicine | Admitting: Emergency Medicine

## 2016-03-06 DIAGNOSIS — J449 Chronic obstructive pulmonary disease, unspecified: Secondary | ICD-10-CM | POA: Insufficient documentation

## 2016-03-06 DIAGNOSIS — I10 Essential (primary) hypertension: Secondary | ICD-10-CM | POA: Diagnosis not present

## 2016-03-06 DIAGNOSIS — F17210 Nicotine dependence, cigarettes, uncomplicated: Secondary | ICD-10-CM | POA: Diagnosis not present

## 2016-03-06 DIAGNOSIS — Z79899 Other long term (current) drug therapy: Secondary | ICD-10-CM | POA: Insufficient documentation

## 2016-03-06 DIAGNOSIS — M25511 Pain in right shoulder: Secondary | ICD-10-CM | POA: Insufficient documentation

## 2016-03-06 MED ORDER — NAPROXEN SODIUM 275 MG PO TABS: 275.0000 mg | ORAL_TABLET | Freq: Two times a day (BID) | ORAL | 0 refills | Status: DC

## 2016-03-06 NOTE — Discharge Instructions (Signed)
Trigger Point Injection Trigger points are areas where you have muscle pain. A trigger point injection is a shot given in the trigger point to relieve that pain. A trigger point might feel like a knot in your muscle. It hurts to press on a trigger point. Sometimes the pain spreads out (radiates) to other parts of the body. For example, pressing on a trigger point in your shoulder might cause pain in your arm or neck. You might have one trigger point. Or, you might have more than one. People often have trigger points in their upper back and lower back. They also occur often in the neck and shoulders. Pain from a trigger point lasts for a long time. It can make it hard to keep moving. You might not be able to do the exercise or physical therapy that could help you deal with the pain. A trigger point injection may help. It does not work for everyone. But, it may relieve your pain for a few days or a few months. A trigger point injection does not cure long-lasting (chronic) pain. LET YOUR CAREGIVER KNOW ABOUT:  Any allergies (especially to latex, lidocaine, or steroids).  Blood-thinning medicines that you take. These drugs can lead to bleeding or bruising after an injection. They include:  Aspirin.  Ibuprofen.  Clopidogrel.  Warfarin.  Other medicines you take. This includes all vitamins, herbs, eyedrops, over-the-counter medicines, and creams.  Use of steroids.  Recent infections.  Past problems with numbing medicines.  Bleeding problems.  Surgeries you have had.  Other health problems. RISKS AND COMPLICATIONS A trigger point injection is a safe treatment. However, problems may develop, such as:  Minor side effects usually go away in 1 to 2 days. These may include:  Soreness.  Bruising.  Stiffness.  More serious problems are rare. But, they may include:  Bleeding under the skin (hematoma).  Skin infection.  Breaking off of the needle under your skin.  Lung  puncture.  The trigger point injection may not work for you. BEFORE THE PROCEDURE You may need to stop taking any medicine that thins your blood. This is to prevent bleeding and bruising. Usually these medicines are stopped several days before the injection. No other preparation is needed. PROCEDURE  A trigger point injection can be given in your caregiver's office or in a clinic. Each injection takes 2 minutes or less.  Your caregiver will feel for trigger points. The caregiver may use a marker to circle the area for the injection.  The skin over the trigger point will be washed with a germ-killing (antiseptic) solution.  The caregiver pinches the spot for the injection.  Then, a very thin needle is used for the shot. You may feel pain or a twitching feeling when the needle enters the trigger point.  A numbing solution may be injected into the trigger point. Sometimes a drug to keep down swelling, redness, and warmth (inflammation) is also injected.  Your caregiver moves the needle around the trigger zone until the tightness and twitching goes away.  After the injection, your caregiver may put gentle pressure over the injection site.  Then it is covered with a bandage. AFTER THE PROCEDURE  You can go right home after the injection.  The bandage can be taken off after a few hours.  You may feel sore and stiff for 1 to 2 days.  Go back to your regular activities slowly. Your caregiver may ask you to stretch your muscles. Do not do anything that takes   extra energy for a few days.  Follow your caregiver's instructions to manage and treat other pain.   This information is not intended to replace advice given to you by your health care provider. Make sure you discuss any questions you have with your health care provider.   Document Released: 07/17/2011 Document Revised: 11/22/2012 Document Reviewed: 07/17/2011 Elsevier Interactive Patient Education 2016 Elsevier Inc.  

## 2016-03-06 NOTE — ED Triage Notes (Addendum)
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  Pt reports arthritis, able to move arm without difficulty.  Pt alert and oriented.

## 2016-03-06 NOTE — ED Provider Notes (Signed)
[facility] DEPT Provider Note   CSN: [id number] Arrival date & time: 03/06/16  1242  First Provider Contact:  First MD Initiated Contact with Patient 03/06/16 1436        History   Chief Complaint Chief Complaint  Patient presents with  . Shoulder Pain    HPI [name] is a 82 y.o. male who presents with chief complaint of right shoulder pain. Patient states that it is in the region of his right trapezius. He states it has been progressively worsening. He is prescribed tramadol by his primary care physician. He states that it hurts for him to raise up his shoulder or turn his neck in either direction. He has been more painful and he hasn't been sleeping well lately. He denies any numbness, paresthesia, weakness of the upper extremities, recent injuries. He has a history of some arthritis of the right shoulder. He is left-handed.  HPI  Past Medical History:  Diagnosis Date  . COPD (chronic obstructive pulmonary disease) ([place])   . Hypertension     Patient Active Problem List   Diagnosis Date Noted  . Hyponatremia 10/19/2014  . Chest pain 10/19/2014  . HTN (hypertension) 10/19/2014  . COPD (chronic obstructive pulmonary disease) ([place]) 10/19/2014    Past Surgical History:  Procedure Laterality Date  . BACK SURGERY         Home Medications    Prior to Admission medications   Medication Sig Start Date End Date Taking? Authorizing Provider  albuterol (PROVENTIL HFA;VENTOLIN HFA) 108 (90 BASE) MCG/ACT inhaler Inhale 2 puffs into the lungs every 6 (six) hours as needed for wheezing.   Yes Historical Provider, MD  albuterol (PROVENTIL) (2.5 MG/3ML) 0.083% nebulizer solution Take 3 mLs (2.5 mg total) by nebulization every 4 (four) hours as needed for wheezing. 01/25/13  Yes Nat Christen, MD  amLODipine (NORVASC) 5 MG tablet Take 5 mg by mouth daily. 09/28/14  Yes Historical Provider, MD  tamsulosin (FLOMAX) 0.4 MG CAPS capsule Take 1 capsule by mouth daily. 09/28/14  Yes  Historical Provider, MD  valsartan (DIOVAN) 160 MG tablet Take 1 tablet (160 mg total) by mouth daily. 10/23/14  Yes Rosita Fire, MD    Family History History reviewed. No pertinent family history.  Social History Social History  Substance Use Topics  . Smoking status: Current Every Day Smoker    Packs/day: 1.00    Types: Cigarettes  . Smokeless tobacco: Not on file  . Alcohol use No     Allergies   Review of patient's allergies indicates no known allergies.   Review of Systems Review of Systems  Ten systems reviewed and are negative for acute change, except as noted in the HPI.   Physical Exam Updated Vital Signs BP 161/81 (BP Location: Left Arm)   Pulse 87   Temp 97.9 F (36.6 C) (Oral)   Resp 20   Ht 5\' 11"  (1.803 m)   Wt 66.2 kg   SpO2 97%   BMI 20.36 kg/m   Physical Exam  Physical Exam  Nursing note and vitals reviewed. Constitutional: He appears well-developed and well-nourished. No distress.  HENT:  Head: Normocephalic and atraumatic.  Eyes: Conjunctivae normal are normal. No scleral icterus.  Neck: Normal range of motion. Neck supple.  Cardiovascular: Normal rate, regular rhythm and normal heart sounds.   Pulmonary/Chest: Effort normal and breath sounds normal. No respiratory distress.  Abdominal: Soft. There is no tenderness.  Musculoskeletal: He exhibits no edema.  Full range of motion of bilateral  shoulders. He has normal strength with proximal muscles, strong grips that are equal bilaterally with normal sensation. Patient is tender to palpation in the right upper trapezius, he has radiation to the posterior occiput with palpation of trigger points in the upper shoulder.  Neurological: He is alert.  oriented 3 Skin: Skin is warm and dry. He is not diaphoretic.  Psychiatric: His behavior is normal.    ED Treatments / Results  Labs (all labs ordered are listed, but only abnormal results are displayed) Labs Reviewed - No data to display  EKG   EKG Interpretation None       Radiology Dg Shoulder Right  Result Date: 03/06/2016 CLINICAL DATA:  Right shoulder pain and decreased range of motion for 2 weeks. EXAM: RIGHT SHOULDER - 2+ VIEW COMPARISON:  None. FINDINGS: There is no evidence of fracture or dislocation. There is no evidence of arthropathy or other focal bone abnormality. Soft tissues are unremarkable. IMPRESSION: Negative. Electronically Signed   By: Misty Stanley M.D.   On: 03/06/2016 13:15   Procedures Procedures (including critical care time)  Medications Ordered in ED Medications - No data to display   Initial Impression / Assessment and Plan / ED Course  I have reviewed the triage vital signs and the nursing notes.  Pertinent labs & imaging results that were available during my care of the patient were reviewed by me and considered in my medical decision making (see chart for details).  Clinical Course    Patient with active trigger point of the right trapezius. I have called his PCP, Dr. Legrand Rams. Discussed the case with the staff member at his office and said he would do well with physical therapy for treatment of muscular discomfort. Patient will be given low-dose Anaprox. To supplement his tramadol therapy. I reviewed the patient's medications in the [place] controlled substances reporting system. Patient appears safe for discharge at this time.  Final Clinical Impressions(s) / ED Diagnoses   Final diagnoses:  Trigger point of right shoulder region  Right shoulder pain    New Prescriptions Discharge Medication List as of 03/06/2016  3:15 PM    START taking these medications   Details  naproxen sodium (ANAPROX) 275 MG tablet Take 1 tablet (275 mg total) by mouth 2 (two) times daily with a meal., Starting Thu 03/06/2016, Print         [place], PA-C 03/06/16 1643    Duffy Bruce, MD 03/06/16 2039

## 2016-03-11 DIAGNOSIS — M25511 Pain in right shoulder: Secondary | ICD-10-CM | POA: Diagnosis not present

## 2016-04-04 DIAGNOSIS — N400 Enlarged prostate without lower urinary tract symptoms: Secondary | ICD-10-CM | POA: Diagnosis not present

## 2016-04-04 DIAGNOSIS — J449 Chronic obstructive pulmonary disease, unspecified: Secondary | ICD-10-CM | POA: Diagnosis not present

## 2016-04-04 DIAGNOSIS — I10 Essential (primary) hypertension: Secondary | ICD-10-CM | POA: Diagnosis not present

## 2016-04-04 DIAGNOSIS — K861 Other chronic pancreatitis: Secondary | ICD-10-CM | POA: Diagnosis not present

## 2016-04-04 DIAGNOSIS — M19011 Primary osteoarthritis, right shoulder: Secondary | ICD-10-CM | POA: Diagnosis not present

## 2016-04-04 DIAGNOSIS — Z0000 Encounter for general adult medical examination without abnormal findings: Secondary | ICD-10-CM | POA: Diagnosis not present

## 2016-04-04 DIAGNOSIS — F17200 Nicotine dependence, unspecified, uncomplicated: Secondary | ICD-10-CM | POA: Diagnosis not present

## 2016-04-09 ENCOUNTER — Other Ambulatory Visit: Payer: Self-pay

## 2016-04-10 ENCOUNTER — Ambulatory Visit (INDEPENDENT_AMBULATORY_CARE_PROVIDER_SITE_OTHER): Payer: Medicare Other

## 2016-04-10 ENCOUNTER — Ambulatory Visit (INDEPENDENT_AMBULATORY_CARE_PROVIDER_SITE_OTHER): Payer: Medicare Other | Admitting: Orthopaedic Surgery

## 2016-04-10 ENCOUNTER — Encounter: Payer: Self-pay | Admitting: Orthopaedic Surgery

## 2016-04-10 VITALS — BP 125/98 | HR 93 | Temp 97.7°F | Ht 68.0 in | Wt 138.0 lb

## 2016-04-10 DIAGNOSIS — Z720 Tobacco use: Secondary | ICD-10-CM

## 2016-04-10 DIAGNOSIS — M25511 Pain in right shoulder: Secondary | ICD-10-CM

## 2016-04-10 DIAGNOSIS — F17200 Nicotine dependence, unspecified, uncomplicated: Secondary | ICD-10-CM

## 2016-04-10 DIAGNOSIS — M542 Cervicalgia: Secondary | ICD-10-CM | POA: Diagnosis not present

## 2016-04-10 NOTE — Patient Instructions (Signed)
Smoking Cessation, Tips for Success If you are ready to quit smoking, congratulations! You have chosen to help yourself be healthier. Cigarettes bring nicotine, tar, carbon monoxide, and other irritants into your body. Your lungs, heart, and blood vessels will be able to work better without these poisons. There are many different ways to quit smoking. Nicotine gum, nicotine patches, a nicotine inhaler, or nicotine nasal spray can help with physical craving. Hypnosis, support groups, and medicines help break the habit of smoking. WHAT THINGS CAN I DO TO MAKE QUITTING EASIER?  Here are some tips to help you quit for good:  Pick a date when you will quit smoking completely. Tell all of your friends and family about your plan to quit on that date.  Do not try to slowly cut down on the number of cigarettes you are smoking. Pick a quit date and quit smoking completely starting on that day.  Throw away all cigarettes.   Clean and remove all ashtrays from your home, work, and car.  On a card, write down your reasons for quitting. Carry the card with you and read it when you get the urge to smoke.  Cleanse your body of nicotine. Drink enough water and fluids to keep your urine clear or pale yellow. Do this after quitting to flush the nicotine from your body.  Learn to predict your moods. Do not let a bad situation be your excuse to have a cigarette. Some situations in your life might tempt you into wanting a cigarette.  Never have "just one" cigarette. It leads to wanting another and another. Remind yourself of your decision to quit.  Change habits associated with smoking. If you smoked while driving or when feeling stressed, try other activities to replace smoking. Stand up when drinking your coffee. Brush your teeth after eating. Sit in a different chair when you read the paper. Avoid alcohol while trying to quit, and try to drink fewer caffeinated beverages. Alcohol and caffeine may urge you to  smoke.  Avoid foods and drinks that can trigger a desire to smoke, such as sugary or spicy foods and alcohol.  Ask people who smoke not to smoke around you.  Have something planned to do right after eating or having a cup of coffee. For example, plan to take a walk or exercise.  Try a relaxation exercise to calm you down and decrease your stress. Remember, you may be tense and nervous for the first 2 weeks after you quit, but this will pass.  Find new activities to keep your hands busy. Play with a pen, coin, or rubber band. Doodle or draw things on paper.  Brush your teeth right after eating. This will help cut down on the craving for the taste of tobacco after meals. You can also try mouthwash.   Use oral substitutes in place of cigarettes. Try using lemon drops, carrots, cinnamon sticks, or chewing gum. Keep them handy so they are available when you have the urge to smoke.  When you have the urge to smoke, try deep breathing.  Designate your home as a nonsmoking area.  If you are a heavy smoker, ask your health care provider about a prescription for nicotine chewing gum. It can ease your withdrawal from nicotine.  Reward yourself. Set aside the cigarette money you save and buy yourself something nice.  Look for support from others. Join a support group or smoking cessation program. Ask someone at home or at work to help you with your plan   to quit smoking.  Always ask yourself, "Do I need this cigarette or is this just a reflex?" Tell yourself, "Today, I choose not to smoke," or "I do not want to smoke." You are reminding yourself of your decision to quit.  Do not replace cigarette smoking with electronic cigarettes (commonly called e-cigarettes). The safety of e-cigarettes is unknown, and some may contain harmful chemicals.  If you relapse, do not give up! Plan ahead and think about what you will do the next time you get the urge to smoke. HOW WILL I FEEL WHEN I QUIT SMOKING? You  may have symptoms of withdrawal because your body is used to nicotine (the addictive substance in cigarettes). You may crave cigarettes, be irritable, feel very hungry, cough often, get headaches, or have difficulty concentrating. The withdrawal symptoms are only temporary. They are strongest when you first quit but will go away within 10-14 days. When withdrawal symptoms occur, stay in control. Think about your reasons for quitting. Remind yourself that these are signs that your body is healing and getting used to being without cigarettes. Remember that withdrawal symptoms are easier to treat than the major diseases that smoking can cause.  Even after the withdrawal is over, expect periodic urges to smoke. However, these cravings are generally short lived and will go away whether you smoke or not. Do not smoke! WHAT RESOURCES ARE AVAILABLE TO HELP ME QUIT SMOKING? Your health care provider can direct you to community resources or hospitals for support, which may include:  Group support.  Education.  Hypnosis.  Therapy.   This information is not intended to replace advice given to you by your health care provider. Make sure you discuss any questions you have with your health care provider.   Document Released: 04/25/2004 Document Revised: 08/18/2014 Document Reviewed: 01/13/2013 Elsevier Interactive Patient Education 2016 Elsevier Inc.  

## 2016-04-10 NOTE — Progress Notes (Signed)
Subjective: my right shoulder hurts    Patient ID: [name], male    DOB: 11-16-1932, 82 y.o.   MRN: [id number]  HPI  He has a month or so history of pain in the right shoulder.  He has had increasing difficulty in raising his hand over his head to the point now he cannot do it without using his left hand to raise his right over his head. He has pain rolling over on the right shoulder at night.  He has no trauma, no redness.  He has pain to the right hand index and long fingers with numbness shooting down from his shoulder.He has some neck pain on the right side.  He has tried ice, heat, rest and rubs with no help. Advil has not helped.  Review of Systems  HENT: Negative for congestion.   Respiratory: Positive for shortness of breath. Negative for cough.   Cardiovascular: Negative for chest pain and leg swelling.  Endocrine: Positive for cold intolerance.  Musculoskeletal: Positive for arthralgias and back pain.  Allergic/Immunologic: Positive for environmental allergies.   Past Medical History:  Diagnosis Date  . COPD (chronic obstructive pulmonary disease) ([place])   . Hypertension     Past Surgical History:  Procedure Laterality Date  . BACK SURGERY    . COLONOSCOPY     polyps removed    Current Outpatient Prescriptions on File Prior to Visit  Medication Sig Dispense Refill  . albuterol (PROVENTIL HFA;VENTOLIN HFA) 108 (90 BASE) MCG/ACT inhaler Inhale 2 puffs into the lungs every 6 (six) hours as needed for wheezing.    Marland Kitchen albuterol (PROVENTIL) (2.5 MG/3ML) 0.083% nebulizer solution Take 3 mLs (2.5 mg total) by nebulization every 4 (four) hours as needed for wheezing. 30 vial 1  . amLODipine (NORVASC) 5 MG tablet Take 5 mg by mouth daily.    . tamsulosin (FLOMAX) 0.4 MG CAPS capsule Take 1 capsule by mouth daily.    . valsartan (DIOVAN) 160 MG tablet Take 1 tablet (160 mg total) by mouth daily. 30 tablet 3  . naproxen sodium (ANAPROX) 275 MG tablet Take 1 tablet (275 mg  total) by mouth 2 (two) times daily with a meal. (Patient not taking: Reported on 04/10/2016) 20 tablet 0   No current facility-administered medications on file prior to visit.     Social History   Social History  . Marital status: Widowed    Spouse name: N/A  . Number of children: N/A  . Years of education: N/A   Occupational History  . Not on file.   Social History Main Topics  . Smoking status: Current Every Day Smoker    Packs/day: 1.00    Types: Cigarettes  . Smokeless tobacco: Never Used  . Alcohol use No  . Drug use: No  . Sexual activity: Not on file   Other Topics Concern  . Not on file   Social History Narrative  . No narrative on file    Family History  Problem Relation Age of Onset  . Family history unknown: Yes  he has family history of lung disease, heart disease and diabetes.  BP (!) 125/98   Pulse 93   Temp 97.7 F (36.5 C)   Ht 5\' 8"  (1.727 m)   Wt 138 lb (62.6 kg)   BMI 20.98 kg/m      Objective:   Physical Exam  Constitutional: He is oriented to person, place, and time. He appears well-developed and well-nourished.  HENT:  Head: Normocephalic  and atraumatic.  Eyes: Conjunctivae and EOM are normal. Pupils are equal, round, and reactive to light.  Neck: Normal range of motion. Neck supple.  Cardiovascular: Normal rate, regular rhythm and intact distal pulses.   Pulmonary/Chest: Effort normal.  Abdominal: Soft.  Musculoskeletal: He exhibits tenderness (Pain is present right shoulder..see other comments below.  ROM of the neck is full but he has paresthesias to the right hand at times when he turns to the right.  NV intact, no weakness.  Left shoulder negative.).  Neurological: He is alert and oriented to person, place, and time. He has normal reflexes. He displays normal reflexes. No cranial nerve deficit. He exhibits normal muscle tone. Coordination normal.  Skin: Skin is warm and dry.  Psychiatric: He has a normal mood and affect. His  behavior is normal. Judgment and thought content normal.   Examination of right Upper Extremity is done.  Inspection:   Overall:  Elbow non-tender without crepitus or defects, forearm non-tender without crepitus or defects, wrist non-tender without crepitus or defects, hand non-tender.    Shoulder: with glenohumeral joint tenderness, without effusion.   Upper arm: without swelling and tenderness   Range of motion:   Overall:  Full range of motion of the elbow, full range of motion of wrist and full range of motion in fingers.   Shoulder:  right  75 degrees forward flexion; 60 degrees abduction; 25 degrees internal rotation, 25 degrees external rotation, 10 degrees extension, 35 degrees adduction.   Stability:   Overall:  Shoulder, elbow and wrist stable   Strength and Tone:   Overall full shoulder muscles strength, full upper arm strength and normal upper arm bulk and tone.  X-rays were done of the cervical spine and also of the right shoulder, reported separately.  I have talked to him about cutting back or stopping smoking.  He will consider it.        Assessment & Plan:   Encounter Diagnoses  Name Primary?  . Pain in joint of right shoulder Yes  . Cervicalgia   . Tobacco smoker within last 12 months    I am concerned about a HNP of the cervical spine causing the right sided paresthesias.  He also has significant DJD of the cervical spine.  I will order a MRI of the neck.  I will inject the right shoulder.  He may need OT for this.  PROCEDURE NOTE:  The patient request injection, verbal consent was obtained.  The right shoulder was prepped appropriately after time out was performed.   Sterile technique was observed and injection of 1 cc of Depo-Medrol 40 mg with several cc's of plain xylocaine. Anesthesia was provided by ethyl chloride and a 20-gauge needle was used to inject the shoulder area. A posterior approach was used.  The injection was tolerated well.  A band aid  dressing was applied.  The patient was advised to apply ice later today and tomorrow to the injection sight as needed.  Return after the MRI of the neck.  Call if any problem.  Precautions discussed.  Electronically Signed [name] Kava, MD 8/31/20171:34 PM

## 2016-04-21 ENCOUNTER — Ambulatory Visit (HOSPITAL_COMMUNITY)
Admission: RE | Admit: 2016-04-21 | Discharge: 2016-04-21 | Disposition: A | Discharge status: Home / Self Care | Payer: Medicare Other | Source: Ambulatory Visit | Attending: Orthopaedic Surgery | Admitting: Orthopaedic Surgery

## 2016-04-21 DIAGNOSIS — M542 Cervicalgia: Secondary | ICD-10-CM | POA: Insufficient documentation

## 2016-04-21 DIAGNOSIS — M2578 Osteophyte, vertebrae: Secondary | ICD-10-CM | POA: Diagnosis not present

## 2016-04-21 DIAGNOSIS — M47892 Other spondylosis, cervical region: Secondary | ICD-10-CM | POA: Diagnosis not present

## 2016-04-21 DIAGNOSIS — M8938 Hypertrophy of bone, other site: Secondary | ICD-10-CM | POA: Diagnosis not present

## 2016-04-21 DIAGNOSIS — M4802 Spinal stenosis, cervical region: Secondary | ICD-10-CM | POA: Diagnosis not present

## 2016-04-21 DIAGNOSIS — M25511 Pain in right shoulder: Secondary | ICD-10-CM

## 2016-04-22 ENCOUNTER — Ambulatory Visit (INDEPENDENT_AMBULATORY_CARE_PROVIDER_SITE_OTHER): Payer: Medicare Other | Admitting: Orthopaedic Surgery

## 2016-04-22 ENCOUNTER — Encounter: Payer: Self-pay | Admitting: Orthopaedic Surgery

## 2016-04-22 VITALS — BP 179/85 | HR 77 | Temp 98.1°F | Ht 67.0 in | Wt 140.0 lb

## 2016-04-22 DIAGNOSIS — F17200 Nicotine dependence, unspecified, uncomplicated: Secondary | ICD-10-CM

## 2016-04-22 DIAGNOSIS — Z720 Tobacco use: Secondary | ICD-10-CM

## 2016-04-22 DIAGNOSIS — M542 Cervicalgia: Secondary | ICD-10-CM | POA: Diagnosis not present

## 2016-04-22 DIAGNOSIS — M25511 Pain in right shoulder: Secondary | ICD-10-CM | POA: Diagnosis not present

## 2016-04-22 NOTE — Progress Notes (Signed)
Patient [id number] E [place], male DOB:June 16, 1933, 82 y.o. [id number]  Chief Complaint  Patient presents with  . Results    C Spine results    HPI  [name] is a 82 y.o. male who has neck pain as well as right shoulder pain.  I injected the right shoulder last time and he has had very good results.  He got the MRI of the neck and it shows: IMPRESSION: 1. No acute findings are seen. There is no cord deformity or evidence of myelopathy. 2. Multilevel spondylosis with posterior osteophytes, uncinate spurring and facet hypertrophy as described. There is resulting mild spinal stenosis at C3-4 and C5-6. There is multilevel foraminal narrowing which appears worst on the right at C5-6, but also moderate at C3-4 and C4-5.  I have explained the findings to him.  He does not need any surgery at this point. HPI  Body mass index is 21.93 kg/m.  ROS  Review of Systems  HENT: Negative for congestion.   Respiratory: Positive for shortness of breath. Negative for cough.   Cardiovascular: Negative for chest pain and leg swelling.  Endocrine: Positive for cold intolerance.  Musculoskeletal: Positive for arthralgias and back pain.  Allergic/Immunologic: Positive for environmental allergies.    Past Medical History:  Diagnosis Date  . COPD (chronic obstructive pulmonary disease) ([place])   . Hypertension     Past Surgical History:  Procedure Laterality Date  . BACK SURGERY    . COLONOSCOPY     polyps removed    Family History  Problem Relation Age of Onset  . Family history unknown: Yes    Social History Social History  Substance Use Topics  . Smoking status: Current Every Day Smoker    Packs/day: 1.00    Types: Cigarettes  . Smokeless tobacco: Never Used  . Alcohol use No    No Known Allergies  Current Outpatient Prescriptions  Medication Sig Dispense Refill  . albuterol (PROVENTIL HFA;VENTOLIN HFA) 108 (90 BASE) MCG/ACT inhaler Inhale 2 puffs into the lungs every  6 (six) hours as needed for wheezing.    Marland Kitchen albuterol (PROVENTIL) (2.5 MG/3ML) 0.083% nebulizer solution Take 3 mLs (2.5 mg total) by nebulization every 4 (four) hours as needed for wheezing. 30 vial 1  . amLODipine (NORVASC) 5 MG tablet Take 5 mg by mouth daily.    . naproxen sodium (ANAPROX) 275 MG tablet Take 1 tablet (275 mg total) by mouth 2 (two) times daily with a meal. 20 tablet 0  . tamsulosin (FLOMAX) 0.4 MG CAPS capsule Take 1 capsule by mouth daily.    . traMADol (ULTRAM) 50 MG tablet Take 50 mg by mouth 3 (three) times daily.    . valsartan (DIOVAN) 160 MG tablet Take 1 tablet (160 mg total) by mouth daily. 30 tablet 3   No current facility-administered medications for this visit.      Physical Exam  Blood pressure (!) 179/85, pulse 77, temperature 98.1 F (36.7 C), height 5\' 7"  (1.702 m), weight 140 lb (63.5 kg).  Constitutional: overall normal hygiene, normal nutrition, well developed, normal grooming, normal body habitus. Assistive device:none  Musculoskeletal: gait and station Limp none, muscle tone and strength are normal, no tremors or atrophy is present.  .  Neurological: coordination overall normal.  Deep tendon reflex/nerve stretch intact.  Sensation normal.  Cranial nerves II-XII intact.   Skin:   Normal overall no scars, lesions, ulcers or rashes. No psoriasis.  Psychiatric: Alert and oriented x 3.  Recent memory intact,  remote memory unclear.  Normal mood and affect. Well groomed.  Good eye contact.  Cardiovascular: overall no swelling, no varicosities, no edema bilaterally, normal temperatures of the legs and arms, no clubbing, cyanosis and good capillary refill.  Lymphatic: palpation is normal.  He has good motion of the neck.  He has no paresthesias.  NV intact.  Grips normal.  His right shoulder is much improved with near normal motion.  The patient has been educated about the nature of the problem(s) and counseled on treatment options.  The patient  appeared to understand what I have discussed and is in agreement with it.  Encounter Diagnoses  Name Primary?  . Pain in joint of right shoulder Yes  . Cervicalgia   . Tobacco smoker within last 12 months     PLAN Call if any problems.  Precautions discussed.  Continue current medications.   Return to clinic 1 month   Electronically Signed Sanjuana Kava, MD 9/12/20173:25 PM

## 2016-04-22 NOTE — Patient Instructions (Signed)

## 2016-05-21 ENCOUNTER — Encounter: Payer: Self-pay | Admitting: Orthopaedic Surgery

## 2016-05-21 ENCOUNTER — Ambulatory Visit (INDEPENDENT_AMBULATORY_CARE_PROVIDER_SITE_OTHER): Payer: Medicare Other | Admitting: Orthopaedic Surgery

## 2016-05-21 VITALS — BP 181/85 | HR 78 | Temp 98.1°F | Ht 68.5 in | Wt 140.0 lb

## 2016-05-21 DIAGNOSIS — F17210 Nicotine dependence, cigarettes, uncomplicated: Secondary | ICD-10-CM | POA: Diagnosis not present

## 2016-05-21 DIAGNOSIS — M25511 Pain in right shoulder: Secondary | ICD-10-CM

## 2016-05-21 DIAGNOSIS — M542 Cervicalgia: Secondary | ICD-10-CM

## 2016-05-21 NOTE — Progress Notes (Signed)
CC:  My right shoulder is sore  He has more pain of the right shoulder and less of the neck area now.  He has been doing his exercises.  He has no paresthesias.  ROM of the right shoulder is full but pain in the extremes.  NV intact.  Encounter Diagnoses  Name Primary?  . Pain in joint of right shoulder Yes  . Cervicalgia   . Cigarette nicotine dependence without complication    He continues to smoke but is trying to cut back.  He has no desire for patches yet.  PROCEDURE NOTE:  The patient request injection, verbal consent was obtained.  The right shoulder was prepped appropriately after time out was performed.   Sterile technique was observed and injection of 1 cc of Depo-Medrol 40 mg with several cc's of plain xylocaine. Anesthesia was provided by ethyl chloride and a 20-gauge needle was used to inject the shoulder area. A posterior approach was used.  The injection was tolerated well.  A band aid dressing was applied.  The patient was advised to apply ice later today and tomorrow to the injection sight as needed.  Return in six weeks.  Call if any problem.  Electronically Signed Sanjuana Kava, MD 10/11/20171:55 PM

## 2016-05-21 NOTE — Patient Instructions (Signed)
Smoking Cessation, Tips for Success If you are ready to quit smoking, congratulations! You have chosen to help yourself be healthier. Cigarettes bring nicotine, tar, carbon monoxide, and other irritants into your body. Your lungs, heart, and blood vessels will be able to work better without these poisons. There are many different ways to quit smoking. Nicotine gum, nicotine patches, a nicotine inhaler, or nicotine nasal spray can help with physical craving. Hypnosis, support groups, and medicines help break the habit of smoking. WHAT THINGS CAN I DO TO MAKE QUITTING EASIER?  Here are some tips to help you quit for good:  Pick a date when you will quit smoking completely. Tell all of your friends and family about your plan to quit on that date.  Do not try to slowly cut down on the number of cigarettes you are smoking. Pick a quit date and quit smoking completely starting on that day.  Throw away all cigarettes.   Clean and remove all ashtrays from your home, work, and car.  On a card, write down your reasons for quitting. Carry the card with you and read it when you get the urge to smoke.  Cleanse your body of nicotine. Drink enough water and fluids to keep your urine clear or pale yellow. Do this after quitting to flush the nicotine from your body.  Learn to predict your moods. Do not let a bad situation be your excuse to have a cigarette. Some situations in your life might tempt you into wanting a cigarette.  Never have "just one" cigarette. It leads to wanting another and another. Remind yourself of your decision to quit.  Change habits associated with smoking. If you smoked while driving or when feeling stressed, try other activities to replace smoking. Stand up when drinking your coffee. Brush your teeth after eating. Sit in a different chair when you read the paper. Avoid alcohol while trying to quit, and try to drink fewer caffeinated beverages. Alcohol and caffeine may urge you to  smoke.  Avoid foods and drinks that can trigger a desire to smoke, such as sugary or spicy foods and alcohol.  Ask people who smoke not to smoke around you.  Have something planned to do right after eating or having a cup of coffee. For example, plan to take a walk or exercise.  Try a relaxation exercise to calm you down and decrease your stress. Remember, you may be tense and nervous for the first 2 weeks after you quit, but this will pass.  Find new activities to keep your hands busy. Play with a pen, coin, or rubber band. Doodle or draw things on paper.  Brush your teeth right after eating. This will help cut down on the craving for the taste of tobacco after meals. You can also try mouthwash.   Use oral substitutes in place of cigarettes. Try using lemon drops, carrots, cinnamon sticks, or chewing gum. Keep them handy so they are available when you have the urge to smoke.  When you have the urge to smoke, try deep breathing.  Designate your home as a nonsmoking area.  If you are a heavy smoker, ask your health care provider about a prescription for nicotine chewing gum. It can ease your withdrawal from nicotine.  Reward yourself. Set aside the cigarette money you save and buy yourself something nice.  Look for support from others. Join a support group or smoking cessation program. Ask someone at home or at work to help you with your plan   to quit smoking.  Always ask yourself, "Do I need this cigarette or is this just a reflex?" Tell yourself, "Today, I choose not to smoke," or "I do not want to smoke." You are reminding yourself of your decision to quit.  Do not replace cigarette smoking with electronic cigarettes (commonly called e-cigarettes). The safety of e-cigarettes is unknown, and some may contain harmful chemicals.  If you relapse, do not give up! Plan ahead and think about what you will do the next time you get the urge to smoke. HOW WILL I FEEL WHEN I QUIT SMOKING? You  may have symptoms of withdrawal because your body is used to nicotine (the addictive substance in cigarettes). You may crave cigarettes, be irritable, feel very hungry, cough often, get headaches, or have difficulty concentrating. The withdrawal symptoms are only temporary. They are strongest when you first quit but will go away within 10-14 days. When withdrawal symptoms occur, stay in control. Think about your reasons for quitting. Remind yourself that these are signs that your body is healing and getting used to being without cigarettes. Remember that withdrawal symptoms are easier to treat than the major diseases that smoking can cause.  Even after the withdrawal is over, expect periodic urges to smoke. However, these cravings are generally short lived and will go away whether you smoke or not. Do not smoke! WHAT RESOURCES ARE AVAILABLE TO HELP ME QUIT SMOKING? Your health care provider can direct you to community resources or hospitals for support, which may include:  Group support.  Education.  Hypnosis.  Therapy.   This information is not intended to replace advice given to you by your health care provider. Make sure you discuss any questions you have with your health care provider.   Document Released: 04/25/2004 Document Revised: 08/18/2014 Document Reviewed: 01/13/2013 Elsevier Interactive Patient Education 2016 Elsevier Inc.  

## 2016-06-04 DIAGNOSIS — D509 Iron deficiency anemia, unspecified: Secondary | ICD-10-CM | POA: Diagnosis not present

## 2016-06-04 DIAGNOSIS — E559 Vitamin D deficiency, unspecified: Secondary | ICD-10-CM | POA: Diagnosis not present

## 2016-06-04 DIAGNOSIS — R809 Proteinuria, unspecified: Secondary | ICD-10-CM | POA: Diagnosis not present

## 2016-06-04 DIAGNOSIS — Z79899 Other long term (current) drug therapy: Secondary | ICD-10-CM | POA: Diagnosis not present

## 2016-06-04 DIAGNOSIS — N183 Chronic kidney disease, stage 3 (moderate): Secondary | ICD-10-CM | POA: Diagnosis not present

## 2016-06-04 DIAGNOSIS — I10 Essential (primary) hypertension: Secondary | ICD-10-CM | POA: Diagnosis not present

## 2016-06-11 DIAGNOSIS — R809 Proteinuria, unspecified: Secondary | ICD-10-CM | POA: Diagnosis not present

## 2016-06-11 DIAGNOSIS — J449 Chronic obstructive pulmonary disease, unspecified: Secondary | ICD-10-CM | POA: Diagnosis not present

## 2016-06-11 DIAGNOSIS — N183 Chronic kidney disease, stage 3 (moderate): Secondary | ICD-10-CM | POA: Diagnosis not present

## 2016-06-11 DIAGNOSIS — I10 Essential (primary) hypertension: Secondary | ICD-10-CM | POA: Diagnosis not present

## 2016-06-19 ENCOUNTER — Encounter: Payer: Self-pay | Admitting: Orthopaedic Surgery

## 2016-07-01 ENCOUNTER — Ambulatory Visit (INDEPENDENT_AMBULATORY_CARE_PROVIDER_SITE_OTHER): Payer: Medicare Other | Admitting: Orthopaedic Surgery

## 2016-07-01 DIAGNOSIS — M25511 Pain in right shoulder: Secondary | ICD-10-CM

## 2016-07-01 NOTE — Progress Notes (Signed)
CC:  My right shoulder hurts  He has recurrent pain of the right shoulder.  He has no redness, no new trauma.  NV intact. ROM is good but tender.  Encounter Diagnosis  Name Primary?  . Pain in joint of right shoulder Yes   PROCEDURE NOTE:  The patient request injection, verbal consent was obtained.  The right shoulder was prepped appropriately after time out was performed.   Sterile technique was observed and injection of 1 cc of Depo-Medrol 40 mg with several cc's of plain xylocaine. Anesthesia was provided by ethyl chloride and a 20-gauge needle was used to inject the shoulder area. A posterior approach was used.  The injection was tolerated well.  A band aid dressing was applied.  The patient was advised to apply ice later today and tomorrow to the injection sight as needed.  Return in six weeks.  Call if any problem.  Electronically [place], MD 11/21/20174:03 PM

## 2016-07-02 ENCOUNTER — Ambulatory Visit: Payer: Medicare Other | Admitting: Orthopaedic Surgery

## 2016-07-09 ENCOUNTER — Ambulatory Visit: Payer: Medicare Other | Admitting: Orthopaedic Surgery

## 2016-07-14 DIAGNOSIS — Z23 Encounter for immunization: Secondary | ICD-10-CM | POA: Diagnosis not present

## 2016-07-14 DIAGNOSIS — I10 Essential (primary) hypertension: Secondary | ICD-10-CM | POA: Diagnosis not present

## 2016-07-14 DIAGNOSIS — F17200 Nicotine dependence, unspecified, uncomplicated: Secondary | ICD-10-CM | POA: Diagnosis not present

## 2016-07-14 DIAGNOSIS — J449 Chronic obstructive pulmonary disease, unspecified: Secondary | ICD-10-CM | POA: Diagnosis not present

## 2016-07-14 DIAGNOSIS — M545 Low back pain: Secondary | ICD-10-CM | POA: Diagnosis not present

## 2016-07-16 DIAGNOSIS — J449 Chronic obstructive pulmonary disease, unspecified: Secondary | ICD-10-CM | POA: Diagnosis not present

## 2016-07-16 DIAGNOSIS — M545 Low back pain: Secondary | ICD-10-CM | POA: Diagnosis not present

## 2016-07-16 DIAGNOSIS — F17200 Nicotine dependence, unspecified, uncomplicated: Secondary | ICD-10-CM | POA: Diagnosis not present

## 2016-07-16 DIAGNOSIS — Z23 Encounter for immunization: Secondary | ICD-10-CM | POA: Diagnosis not present

## 2016-07-16 DIAGNOSIS — I10 Essential (primary) hypertension: Secondary | ICD-10-CM | POA: Diagnosis not present

## 2016-08-12 ENCOUNTER — Encounter: Payer: Self-pay | Admitting: Orthopaedic Surgery

## 2016-08-12 ENCOUNTER — Ambulatory Visit (INDEPENDENT_AMBULATORY_CARE_PROVIDER_SITE_OTHER): Payer: Medicare Other | Admitting: Orthopaedic Surgery

## 2016-08-12 VITALS — BP 199/91 | HR 86 | Temp 97.9°F | Ht 69.0 in | Wt 141.0 lb

## 2016-08-12 DIAGNOSIS — F17210 Nicotine dependence, cigarettes, uncomplicated: Secondary | ICD-10-CM | POA: Diagnosis not present

## 2016-08-12 DIAGNOSIS — M542 Cervicalgia: Secondary | ICD-10-CM | POA: Diagnosis not present

## 2016-08-12 DIAGNOSIS — M25511 Pain in right shoulder: Secondary | ICD-10-CM

## 2016-08-12 NOTE — Progress Notes (Signed)
Patient [id number] E [place], male DOB:02-13-1933, 83 y.o. [id number]  Chief Complaint  Patient presents with  . Follow-up    chronic right shoulder pain    HPI  [name] is a 83 y.o. male who has pain in the right shoulder.  He is much improved.  The injection last time helped. He still has limited motion but his pain is much less.  He has no new trauma.  He continues to smoke and is not willing to stop. HPI  Body mass index is 20.82 kg/m.  ROS  Review of Systems  HENT: Negative for congestion.   Respiratory: Positive for shortness of breath. Negative for cough.   Cardiovascular: Negative for chest pain and leg swelling.  Endocrine: Positive for cold intolerance.  Musculoskeletal: Positive for arthralgias and back pain.  Allergic/Immunologic: Positive for environmental allergies.    Past Medical History:  Diagnosis Date  . COPD (chronic obstructive pulmonary disease) ([place])   . Hypertension     Past Surgical History:  Procedure Laterality Date  . BACK SURGERY    . COLONOSCOPY     polyps removed    Family History  Problem Relation Age of Onset  . Family history unknown: Yes    Social History Social History  Substance Use Topics  . Smoking status: Current Every Day Smoker    Packs/day: 1.00    Types: Cigarettes  . Smokeless tobacco: Never Used  . Alcohol use No    No Known Allergies  Current Outpatient Prescriptions  Medication Sig Dispense Refill  . albuterol (PROVENTIL HFA;VENTOLIN HFA) 108 (90 BASE) MCG/ACT inhaler Inhale 2 puffs into the lungs every 6 (six) hours as needed for wheezing.    Marland Kitchen albuterol (PROVENTIL) (2.5 MG/3ML) 0.083% nebulizer solution Take 3 mLs (2.5 mg total) by nebulization every 4 (four) hours as needed for wheezing. 30 vial 1  . amLODipine (NORVASC) 5 MG tablet Take 5 mg by mouth daily.    . naproxen sodium (ANAPROX) 275 MG tablet Take 1 tablet (275 mg total) by mouth 2 (two) times daily with a meal. 20 tablet 0  . tamsulosin  (FLOMAX) 0.4 MG CAPS capsule Take 1 capsule by mouth daily.    . traMADol (ULTRAM) 50 MG tablet Take 50 mg by mouth 3 (three) times daily.    . valsartan (DIOVAN) 160 MG tablet Take 1 tablet (160 mg total) by mouth daily. 30 tablet 3   No current facility-administered medications for this visit.      Physical Exam  Blood pressure (!) 199/91, pulse 86, temperature 97.9 F (36.6 C), height 5\' 9"  (1.753 m), weight 141 lb (64 kg).  Constitutional: overall normal hygiene, normal nutrition, well developed, normal grooming, normal body habitus. Assistive device:none  Musculoskeletal: gait and station Limp none, muscle tone and strength are normal, no tremors or atrophy is present.  .  Neurological: coordination overall normal.  Deep tendon reflex/nerve stretch intact.  Sensation normal.  Cranial nerves II-XII intact.   Skin:   Normal overall no scars, lesions, ulcers or rashes. No psoriasis.  Psychiatric: Alert and oriented x 3.  Recent memory intact, remote memory unclear.  Normal mood and affect. Well groomed.  Good eye contact.  Cardiovascular: overall no swelling, no varicosities, no edema bilaterally, normal temperatures of the legs and arms, no clubbing, cyanosis and good capillary refill.  Lymphatic: palpation is normal.  Examination of right Upper Extremity is done.  Inspection:   Overall:  Elbow non-tender without crepitus or defects, forearm non-tender without  crepitus or defects, wrist non-tender without crepitus or defects, hand non-tender.    Shoulder: with glenohumeral joint tenderness, without effusion.   Upper arm: without swelling and tenderness   Range of motion:   Overall:  Full range of motion of the elbow, full range of motion of wrist and full range of motion in fingers.   Shoulder:  right  90 degrees forward flexion; 75 degrees abduction; 25 degrees internal rotation, 25 degrees external rotation, 10 degrees extension, 35 degrees  adduction.   Stability:   Overall:  Shoulder, elbow and wrist stable   Strength and Tone:   Overall full shoulder muscles strength, full upper arm strength and normal upper arm bulk and tone.   The patient has been educated about the nature of the problem(s) and counseled on treatment options.  The patient appeared to understand what I have discussed and is in agreement with it.  Encounter Diagnoses  Name Primary?  . Pain in joint of right shoulder Yes  . Cervicalgia   . Cigarette nicotine dependence without complication     PLAN Call if any problems.  Precautions discussed.  Continue current medications.   Return to clinic PRN   Electronically Signed Sanjuana Kava, MD 1/2/20182:04 PM

## 2016-08-12 NOTE — Patient Instructions (Signed)
Steps to Quit Smoking Smoking tobacco can be bad for your health. It can also affect almost every organ in your body. Smoking puts you and people around you at risk for many serious long-lasting (chronic) diseases. Quitting smoking is hard, but it is one of the best things that you can do for your health. It is never too late to quit. What are the benefits of quitting smoking? When you quit smoking, you lower your risk for getting serious diseases and conditions. They can include:  Lung cancer or lung disease.  Heart disease.  Stroke.  Heart attack.  Not being able to have children (infertility).  Weak bones (osteoporosis) and broken bones (fractures). If you have coughing, wheezing, and shortness of breath, those symptoms may get better when you quit. You may also get sick less often. If you are pregnant, quitting smoking can help to lower your chances of having a baby of low birth weight. What can I do to help me quit smoking? Talk with your doctor about what can help you quit smoking. Some things you can do (strategies) include:  Quitting smoking totally, instead of slowly cutting back how much you smoke over a period of time.  Going to in-person counseling. You are more likely to quit if you go to many counseling sessions.  Using resources and support systems, such as:  Online chats with a counselor.  Phone quitlines.  Printed self-help materials.  Support groups or group counseling.  Text messaging programs.  Mobile phone apps or applications.  Taking medicines. Some of these medicines may have nicotine in them. If you are pregnant or breastfeeding, do not take any medicines to quit smoking unless your doctor says it is okay. Talk with your doctor about counseling or other things that can help you. Talk with your doctor about using more than one strategy at the same time, such as taking medicines while you are also going to in-person counseling. This can help make quitting  easier. What things can I do to make it easier to quit? Quitting smoking might feel very hard at first, but there is a lot that you can do to make it easier. Take these steps:  Talk to your family and friends. Ask them to support and encourage you.  Call phone quitlines, reach out to support groups, or work with a counselor.  Ask people who smoke to not smoke around you.  Avoid places that make you want (trigger) to smoke, such as:  Bars.  Parties.  Smoke-break areas at work.  Spend time with people who do not smoke.  Lower the stress in your life. Stress can make you want to smoke. Try these things to help your stress:  Getting regular exercise.  Deep-breathing exercises.  Yoga.  Meditating.  Doing a body scan. To do this, close your eyes, focus on one area of your body at a time from head to toe, and notice which parts of your body are tense. Try to relax the muscles in those areas.  Download or buy apps on your mobile phone or tablet that can help you stick to your quit plan. There are many free apps, such as QuitGuide from the CDC (Centers for Disease Control and Prevention). You can find more support from smokefree.gov and other websites. This information is not intended to replace advice given to you by your health care provider. Make sure you discuss any questions you have with your health care provider. Document Released: 05/24/2009 Document Revised: 03/25/2016 Document   Reviewed: 12/12/2014 Elsevier Interactive Patient Education  2017 [employer] Inc.  

## 2016-10-14 DIAGNOSIS — J449 Chronic obstructive pulmonary disease, unspecified: Secondary | ICD-10-CM | POA: Diagnosis not present

## 2016-10-14 DIAGNOSIS — M19011 Primary osteoarthritis, right shoulder: Secondary | ICD-10-CM | POA: Diagnosis not present

## 2016-10-14 DIAGNOSIS — I10 Essential (primary) hypertension: Secondary | ICD-10-CM | POA: Diagnosis not present

## 2016-10-14 DIAGNOSIS — F17218 Nicotine dependence, cigarettes, with other nicotine-induced disorders: Secondary | ICD-10-CM | POA: Diagnosis not present

## 2016-11-05 DIAGNOSIS — R809 Proteinuria, unspecified: Secondary | ICD-10-CM | POA: Diagnosis not present

## 2016-11-05 DIAGNOSIS — D509 Iron deficiency anemia, unspecified: Secondary | ICD-10-CM | POA: Diagnosis not present

## 2016-11-05 DIAGNOSIS — I10 Essential (primary) hypertension: Secondary | ICD-10-CM | POA: Diagnosis not present

## 2016-11-05 DIAGNOSIS — E559 Vitamin D deficiency, unspecified: Secondary | ICD-10-CM | POA: Diagnosis not present

## 2016-11-05 DIAGNOSIS — Z79899 Other long term (current) drug therapy: Secondary | ICD-10-CM | POA: Diagnosis not present

## 2016-11-05 DIAGNOSIS — N183 Chronic kidney disease, stage 3 (moderate): Secondary | ICD-10-CM | POA: Diagnosis not present

## 2016-11-12 DIAGNOSIS — R809 Proteinuria, unspecified: Secondary | ICD-10-CM | POA: Diagnosis not present

## 2016-11-12 DIAGNOSIS — N183 Chronic kidney disease, stage 3 (moderate): Secondary | ICD-10-CM | POA: Diagnosis not present

## 2016-11-12 DIAGNOSIS — D509 Iron deficiency anemia, unspecified: Secondary | ICD-10-CM | POA: Diagnosis not present

## 2016-11-12 DIAGNOSIS — I10 Essential (primary) hypertension: Secondary | ICD-10-CM | POA: Diagnosis not present

## 2017-01-15 DIAGNOSIS — N400 Enlarged prostate without lower urinary tract symptoms: Secondary | ICD-10-CM | POA: Diagnosis not present

## 2017-01-15 DIAGNOSIS — J449 Chronic obstructive pulmonary disease, unspecified: Secondary | ICD-10-CM | POA: Diagnosis not present

## 2017-01-15 DIAGNOSIS — I10 Essential (primary) hypertension: Secondary | ICD-10-CM | POA: Diagnosis not present

## 2017-04-14 ENCOUNTER — Other Ambulatory Visit (HOSPITAL_COMMUNITY): Payer: Self-pay | Admitting: Internal Medicine

## 2017-04-14 DIAGNOSIS — I739 Peripheral vascular disease, unspecified: Secondary | ICD-10-CM | POA: Diagnosis not present

## 2017-04-14 DIAGNOSIS — Z1389 Encounter for screening for other disorder: Secondary | ICD-10-CM | POA: Diagnosis not present

## 2017-04-14 DIAGNOSIS — J449 Chronic obstructive pulmonary disease, unspecified: Secondary | ICD-10-CM | POA: Diagnosis not present

## 2017-04-14 DIAGNOSIS — E785 Hyperlipidemia, unspecified: Secondary | ICD-10-CM | POA: Diagnosis not present

## 2017-04-14 DIAGNOSIS — F17210 Nicotine dependence, cigarettes, uncomplicated: Secondary | ICD-10-CM | POA: Diagnosis not present

## 2017-04-14 DIAGNOSIS — N400 Enlarged prostate without lower urinary tract symptoms: Secondary | ICD-10-CM | POA: Diagnosis not present

## 2017-04-14 DIAGNOSIS — K861 Other chronic pancreatitis: Secondary | ICD-10-CM | POA: Diagnosis not present

## 2017-04-14 DIAGNOSIS — I10 Essential (primary) hypertension: Secondary | ICD-10-CM | POA: Diagnosis not present

## 2017-04-17 ENCOUNTER — Ambulatory Visit (HOSPITAL_COMMUNITY)
Admission: RE | Admit: 2017-04-17 | Discharge: 2017-04-17 | Disposition: A | Discharge status: Home / Self Care | Payer: Medicare Other | Source: Ambulatory Visit | Attending: Internal Medicine | Admitting: Internal Medicine

## 2017-04-17 DIAGNOSIS — I739 Peripheral vascular disease, unspecified: Secondary | ICD-10-CM | POA: Diagnosis not present

## 2017-04-17 DIAGNOSIS — M79604 Pain in right leg: Secondary | ICD-10-CM | POA: Diagnosis not present

## 2017-04-17 DIAGNOSIS — M79605 Pain in left leg: Secondary | ICD-10-CM | POA: Diagnosis not present

## 2017-05-05 DIAGNOSIS — N183 Chronic kidney disease, stage 3 (moderate): Secondary | ICD-10-CM | POA: Diagnosis not present

## 2017-05-05 DIAGNOSIS — Z79899 Other long term (current) drug therapy: Secondary | ICD-10-CM | POA: Diagnosis not present

## 2017-05-05 DIAGNOSIS — D509 Iron deficiency anemia, unspecified: Secondary | ICD-10-CM | POA: Diagnosis not present

## 2017-05-05 DIAGNOSIS — R809 Proteinuria, unspecified: Secondary | ICD-10-CM | POA: Diagnosis not present

## 2017-05-05 DIAGNOSIS — I10 Essential (primary) hypertension: Secondary | ICD-10-CM | POA: Diagnosis not present

## 2017-05-13 DIAGNOSIS — I10 Essential (primary) hypertension: Secondary | ICD-10-CM | POA: Diagnosis not present

## 2017-05-13 DIAGNOSIS — N183 Chronic kidney disease, stage 3 (moderate): Secondary | ICD-10-CM | POA: Diagnosis not present

## 2017-05-13 DIAGNOSIS — J449 Chronic obstructive pulmonary disease, unspecified: Secondary | ICD-10-CM | POA: Diagnosis not present

## 2017-05-13 DIAGNOSIS — E559 Vitamin D deficiency, unspecified: Secondary | ICD-10-CM | POA: Diagnosis not present

## 2017-05-13 DIAGNOSIS — R809 Proteinuria, unspecified: Secondary | ICD-10-CM | POA: Diagnosis not present

## 2017-07-14 DIAGNOSIS — J449 Chronic obstructive pulmonary disease, unspecified: Secondary | ICD-10-CM | POA: Diagnosis not present

## 2017-07-14 DIAGNOSIS — I10 Essential (primary) hypertension: Secondary | ICD-10-CM | POA: Diagnosis not present

## 2017-07-14 DIAGNOSIS — K861 Other chronic pancreatitis: Secondary | ICD-10-CM | POA: Diagnosis not present

## 2017-07-14 DIAGNOSIS — Z23 Encounter for immunization: Secondary | ICD-10-CM | POA: Diagnosis not present

## 2017-07-14 DIAGNOSIS — F17200 Nicotine dependence, unspecified, uncomplicated: Secondary | ICD-10-CM | POA: Diagnosis not present

## 2017-10-26 DIAGNOSIS — I10 Essential (primary) hypertension: Secondary | ICD-10-CM | POA: Diagnosis not present

## 2017-10-26 DIAGNOSIS — N400 Enlarged prostate without lower urinary tract symptoms: Secondary | ICD-10-CM | POA: Diagnosis not present

## 2017-10-26 DIAGNOSIS — F17200 Nicotine dependence, unspecified, uncomplicated: Secondary | ICD-10-CM | POA: Diagnosis not present

## 2017-10-26 DIAGNOSIS — J449 Chronic obstructive pulmonary disease, unspecified: Secondary | ICD-10-CM | POA: Diagnosis not present

## 2017-10-26 DIAGNOSIS — F17210 Nicotine dependence, cigarettes, uncomplicated: Secondary | ICD-10-CM | POA: Diagnosis not present

## 2017-11-13 DIAGNOSIS — I10 Essential (primary) hypertension: Secondary | ICD-10-CM | POA: Diagnosis not present

## 2017-11-13 DIAGNOSIS — D509 Iron deficiency anemia, unspecified: Secondary | ICD-10-CM | POA: Diagnosis not present

## 2017-11-13 DIAGNOSIS — Z79899 Other long term (current) drug therapy: Secondary | ICD-10-CM | POA: Diagnosis not present

## 2017-11-13 DIAGNOSIS — R809 Proteinuria, unspecified: Secondary | ICD-10-CM | POA: Diagnosis not present

## 2017-11-13 DIAGNOSIS — N183 Chronic kidney disease, stage 3 (moderate): Secondary | ICD-10-CM | POA: Diagnosis not present

## 2017-11-13 DIAGNOSIS — E559 Vitamin D deficiency, unspecified: Secondary | ICD-10-CM | POA: Diagnosis not present

## 2017-11-18 DIAGNOSIS — Z720 Tobacco use: Secondary | ICD-10-CM | POA: Diagnosis not present

## 2017-11-18 DIAGNOSIS — N183 Chronic kidney disease, stage 3 (moderate): Secondary | ICD-10-CM | POA: Diagnosis not present

## 2017-11-18 DIAGNOSIS — D509 Iron deficiency anemia, unspecified: Secondary | ICD-10-CM | POA: Diagnosis not present

## 2017-11-18 DIAGNOSIS — R809 Proteinuria, unspecified: Secondary | ICD-10-CM | POA: Diagnosis not present

## 2017-11-18 DIAGNOSIS — E559 Vitamin D deficiency, unspecified: Secondary | ICD-10-CM | POA: Diagnosis not present

## 2017-12-22 ENCOUNTER — Encounter: Payer: Self-pay | Admitting: Gastroenterology

## 2018-01-27 DIAGNOSIS — M19011 Primary osteoarthritis, right shoulder: Secondary | ICD-10-CM | POA: Diagnosis not present

## 2018-01-27 DIAGNOSIS — K861 Other chronic pancreatitis: Secondary | ICD-10-CM | POA: Diagnosis not present

## 2018-01-27 DIAGNOSIS — J449 Chronic obstructive pulmonary disease, unspecified: Secondary | ICD-10-CM | POA: Diagnosis not present

## 2018-01-27 DIAGNOSIS — I10 Essential (primary) hypertension: Secondary | ICD-10-CM | POA: Diagnosis not present

## 2018-03-12 ENCOUNTER — Other Ambulatory Visit: Payer: Self-pay

## 2018-03-24 ENCOUNTER — Telehealth: Payer: Self-pay | Admitting: Gastroenterology

## 2018-03-24 ENCOUNTER — Encounter: Payer: Self-pay | Admitting: Gastroenterology

## 2018-03-24 ENCOUNTER — Ambulatory Visit: Payer: Medicare Other | Admitting: Gastroenterology

## 2018-03-24 NOTE — Telephone Encounter (Signed)
PATIENT WAS A NO SHOW AND LETTER SENT  °

## 2018-04-19 DIAGNOSIS — I10 Essential (primary) hypertension: Secondary | ICD-10-CM | POA: Diagnosis not present

## 2018-04-19 DIAGNOSIS — Z79899 Other long term (current) drug therapy: Secondary | ICD-10-CM | POA: Diagnosis not present

## 2018-04-19 DIAGNOSIS — N183 Chronic kidney disease, stage 3 (moderate): Secondary | ICD-10-CM | POA: Diagnosis not present

## 2018-04-19 DIAGNOSIS — R809 Proteinuria, unspecified: Secondary | ICD-10-CM | POA: Diagnosis not present

## 2018-04-19 DIAGNOSIS — D509 Iron deficiency anemia, unspecified: Secondary | ICD-10-CM | POA: Diagnosis not present

## 2018-04-19 DIAGNOSIS — E559 Vitamin D deficiency, unspecified: Secondary | ICD-10-CM | POA: Diagnosis not present

## 2018-04-21 DIAGNOSIS — E559 Vitamin D deficiency, unspecified: Secondary | ICD-10-CM | POA: Diagnosis not present

## 2018-04-21 DIAGNOSIS — R809 Proteinuria, unspecified: Secondary | ICD-10-CM | POA: Diagnosis not present

## 2018-04-21 DIAGNOSIS — N183 Chronic kidney disease, stage 3 (moderate): Secondary | ICD-10-CM | POA: Diagnosis not present

## 2018-04-21 DIAGNOSIS — I10 Essential (primary) hypertension: Secondary | ICD-10-CM | POA: Diagnosis not present

## 2018-04-23 ENCOUNTER — Other Ambulatory Visit: Payer: Self-pay

## 2018-04-23 DIAGNOSIS — I10 Essential (primary) hypertension: Secondary | ICD-10-CM | POA: Diagnosis not present

## 2018-04-23 DIAGNOSIS — I701 Atherosclerosis of renal artery: Secondary | ICD-10-CM

## 2018-04-23 DIAGNOSIS — E559 Vitamin D deficiency, unspecified: Secondary | ICD-10-CM | POA: Diagnosis not present

## 2018-04-23 DIAGNOSIS — N183 Chronic kidney disease, stage 3 (moderate): Secondary | ICD-10-CM | POA: Diagnosis not present

## 2018-04-23 DIAGNOSIS — Z720 Tobacco use: Secondary | ICD-10-CM | POA: Diagnosis not present

## 2018-04-26 DIAGNOSIS — E785 Hyperlipidemia, unspecified: Secondary | ICD-10-CM | POA: Diagnosis not present

## 2018-04-26 DIAGNOSIS — Z1389 Encounter for screening for other disorder: Secondary | ICD-10-CM | POA: Diagnosis not present

## 2018-04-26 DIAGNOSIS — Z1331 Encounter for screening for depression: Secondary | ICD-10-CM | POA: Diagnosis not present

## 2018-04-26 DIAGNOSIS — N400 Enlarged prostate without lower urinary tract symptoms: Secondary | ICD-10-CM | POA: Diagnosis not present

## 2018-04-26 DIAGNOSIS — J449 Chronic obstructive pulmonary disease, unspecified: Secondary | ICD-10-CM | POA: Diagnosis not present

## 2018-04-26 DIAGNOSIS — K861 Other chronic pancreatitis: Secondary | ICD-10-CM | POA: Diagnosis not present

## 2018-04-26 DIAGNOSIS — Z0000 Encounter for general adult medical examination without abnormal findings: Secondary | ICD-10-CM | POA: Diagnosis not present

## 2018-04-26 DIAGNOSIS — Z23 Encounter for immunization: Secondary | ICD-10-CM | POA: Diagnosis not present

## 2018-04-26 DIAGNOSIS — F17210 Nicotine dependence, cigarettes, uncomplicated: Secondary | ICD-10-CM | POA: Diagnosis not present

## 2018-04-26 DIAGNOSIS — M545 Low back pain: Secondary | ICD-10-CM | POA: Diagnosis not present

## 2018-04-26 DIAGNOSIS — I10 Essential (primary) hypertension: Secondary | ICD-10-CM | POA: Diagnosis not present

## 2018-04-28 ENCOUNTER — Ambulatory Visit (HOSPITAL_COMMUNITY)
Admission: RE | Admit: 2018-04-28 | Discharge: 2018-04-28 | Disposition: A | Discharge status: Home / Self Care | Payer: Medicare Other | Source: Ambulatory Visit | Attending: Family | Admitting: Family

## 2018-04-28 DIAGNOSIS — I10 Essential (primary) hypertension: Secondary | ICD-10-CM | POA: Insufficient documentation

## 2018-04-28 DIAGNOSIS — I701 Atherosclerosis of renal artery: Secondary | ICD-10-CM | POA: Insufficient documentation

## 2018-04-28 DIAGNOSIS — Q6101 Congenital single renal cyst: Secondary | ICD-10-CM | POA: Insufficient documentation

## 2018-05-24 DIAGNOSIS — E559 Vitamin D deficiency, unspecified: Secondary | ICD-10-CM | POA: Diagnosis not present

## 2018-05-24 DIAGNOSIS — Z720 Tobacco use: Secondary | ICD-10-CM | POA: Diagnosis not present

## 2018-05-24 DIAGNOSIS — R809 Proteinuria, unspecified: Secondary | ICD-10-CM | POA: Diagnosis not present

## 2018-05-24 DIAGNOSIS — N183 Chronic kidney disease, stage 3 (moderate): Secondary | ICD-10-CM | POA: Diagnosis not present

## 2018-05-26 ENCOUNTER — Emergency Department (HOSPITAL_COMMUNITY): Payer: Medicare Other

## 2018-05-26 ENCOUNTER — Encounter (HOSPITAL_COMMUNITY): Payer: Self-pay

## 2018-05-26 ENCOUNTER — Other Ambulatory Visit: Payer: Self-pay

## 2018-05-26 ENCOUNTER — Inpatient Hospital Stay (HOSPITAL_COMMUNITY)
Admission: EM | Admit: 2018-05-26 | Discharge: 2018-05-30 | DRG: 064 | Disposition: A | Discharge status: Home Health | Payer: Medicare Other | Attending: Internal Medicine | Admitting: Internal Medicine

## 2018-05-26 DIAGNOSIS — I712 Thoracic aortic aneurysm, without rupture: Secondary | ICD-10-CM | POA: Diagnosis present

## 2018-05-26 DIAGNOSIS — G8194 Hemiplegia, unspecified affecting left nondominant side: Secondary | ICD-10-CM | POA: Diagnosis not present

## 2018-05-26 DIAGNOSIS — I639 Cerebral infarction, unspecified: Secondary | ICD-10-CM | POA: Diagnosis present

## 2018-05-26 DIAGNOSIS — R29810 Facial weakness: Secondary | ICD-10-CM | POA: Diagnosis not present

## 2018-05-26 DIAGNOSIS — R531 Weakness: Secondary | ICD-10-CM | POA: Diagnosis not present

## 2018-05-26 DIAGNOSIS — I2510 Atherosclerotic heart disease of native coronary artery without angina pectoris: Secondary | ICD-10-CM | POA: Diagnosis present

## 2018-05-26 DIAGNOSIS — R0689 Other abnormalities of breathing: Secondary | ICD-10-CM | POA: Diagnosis not present

## 2018-05-26 DIAGNOSIS — R000 Tachycardia, unspecified: Secondary | ICD-10-CM | POA: Diagnosis not present

## 2018-05-26 DIAGNOSIS — Z7951 Long term (current) use of inhaled steroids: Secondary | ICD-10-CM

## 2018-05-26 DIAGNOSIS — Z79899 Other long term (current) drug therapy: Secondary | ICD-10-CM

## 2018-05-26 DIAGNOSIS — I10 Essential (primary) hypertension: Secondary | ICD-10-CM | POA: Diagnosis not present

## 2018-05-26 DIAGNOSIS — J850 Gangrene and necrosis of lung: Secondary | ICD-10-CM | POA: Diagnosis not present

## 2018-05-26 DIAGNOSIS — I6340 Cerebral infarction due to embolism of unspecified cerebral artery: Secondary | ICD-10-CM | POA: Diagnosis not present

## 2018-05-26 DIAGNOSIS — I6523 Occlusion and stenosis of bilateral carotid arteries: Secondary | ICD-10-CM | POA: Diagnosis present

## 2018-05-26 DIAGNOSIS — R29818 Other symptoms and signs involving the nervous system: Secondary | ICD-10-CM | POA: Diagnosis not present

## 2018-05-26 DIAGNOSIS — R471 Dysarthria and anarthria: Secondary | ICD-10-CM | POA: Diagnosis present

## 2018-05-26 DIAGNOSIS — J449 Chronic obstructive pulmonary disease, unspecified: Secondary | ICD-10-CM | POA: Diagnosis present

## 2018-05-26 DIAGNOSIS — M6281 Muscle weakness (generalized): Secondary | ICD-10-CM | POA: Diagnosis not present

## 2018-05-26 DIAGNOSIS — I63231 Cerebral infarction due to unspecified occlusion or stenosis of right carotid arteries: Secondary | ICD-10-CM

## 2018-05-26 DIAGNOSIS — R29703 NIHSS score 3: Secondary | ICD-10-CM | POA: Diagnosis present

## 2018-05-26 DIAGNOSIS — F17210 Nicotine dependence, cigarettes, uncomplicated: Secondary | ICD-10-CM | POA: Diagnosis present

## 2018-05-26 DIAGNOSIS — E876 Hypokalemia: Secondary | ICD-10-CM | POA: Diagnosis not present

## 2018-05-26 DIAGNOSIS — R0902 Hypoxemia: Secondary | ICD-10-CM | POA: Diagnosis not present

## 2018-05-26 LAB — I-STAT CHEM 8, ED
BUN: 21 mg/dL (ref 8–23)
CREATININE: 1.2 mg/dL (ref 0.61–1.24)
Calcium, Ion: 1.15 mmol/L (ref 1.15–1.40)
Chloride: 100 mmol/L (ref 98–111)
GLUCOSE: 133 mg/dL — AB (ref 70–99)
HCT: 49 % (ref 39.0–52.0)
Hemoglobin: 16.7 g/dL (ref 13.0–17.0)
Potassium: 3.9 mmol/L (ref 3.5–5.1)
Sodium: 134 mmol/L — ABNORMAL LOW (ref 135–145)
TCO2: 22 mmol/L (ref 22–32)

## 2018-05-26 LAB — DIFFERENTIAL
Abs Immature Granulocytes: 0.05 10*3/uL (ref 0.00–0.07)
BASOS ABS: 0.1 10*3/uL (ref 0.0–0.1)
BASOS PCT: 1 %
EOS ABS: 0.1 10*3/uL (ref 0.0–0.5)
Eosinophils Relative: 1 %
Immature Granulocytes: 1 %
Lymphocytes Relative: 12 %
Lymphs Abs: 1.2 10*3/uL (ref 0.7–4.0)
MONO ABS: 0.6 10*3/uL (ref 0.1–1.0)
Monocytes Relative: 6 %
NEUTROS ABS: 8.2 10*3/uL — AB (ref 1.7–7.7)
NEUTROS PCT: 79 %

## 2018-05-26 LAB — COMPREHENSIVE METABOLIC PANEL
ALT: 11 U/L (ref 0–44)
ANION GAP: 9 (ref 5–15)
AST: 18 U/L (ref 15–41)
Albumin: 4.4 g/dL (ref 3.5–5.0)
Alkaline Phosphatase: 83 U/L (ref 38–126)
BUN: 21 mg/dL (ref 8–23)
CHLORIDE: 102 mmol/L (ref 98–111)
CO2: 21 mmol/L — AB (ref 22–32)
CREATININE: 1.24 mg/dL (ref 0.61–1.24)
Calcium: 9.4 mg/dL (ref 8.9–10.3)
GFR calc Af Amer: >60 mL/min (ref >60)
GFR calc non Af Amer: 52 mL/min — ABNORMAL LOW (ref >60)
Glucose, Bld: 136 mg/dL — ABNORMAL HIGH (ref 70–99)
POTASSIUM: 3.8 mmol/L (ref 3.5–5.1)
SODIUM: 132 mmol/L — AB (ref 135–145)
Total Bilirubin: 0.6 mg/dL (ref 0.3–1.2)
Total Protein: 7.7 g/dL (ref 6.5–8.1)

## 2018-05-26 LAB — CBC
HCT: 45.7 % (ref 39.0–52.0)
HEMOGLOBIN: 14.9 g/dL (ref 13.0–17.0)
MCH: 29.7 pg (ref 26.0–34.0)
MCHC: 32.6 g/dL (ref 30.0–36.0)
MCV: 91 fL (ref 80.0–100.0)
PLATELETS: 237 10*3/uL (ref 150–400)
RBC: 5.02 MIL/uL (ref 4.22–5.81)
RDW: 14.6 % (ref 11.5–15.5)
WBC: 10.3 10*3/uL (ref 4.0–10.5)
nRBC: 0 % (ref 0.0–0.2)

## 2018-05-26 LAB — PROTIME-INR
INR: 0.96
Prothrombin Time: 12.7 seconds (ref 11.4–15.2)

## 2018-05-26 LAB — ETHANOL: Alcohol, Ethyl (B): <10 mg/dL (ref <10)

## 2018-05-26 LAB — I-STAT TROPONIN, ED: TROPONIN I, POC: 0.01 ng/mL (ref 0.00–0.08)

## 2018-05-26 LAB — CBG MONITORING, ED: Glucose-Capillary: 122 mg/dL — ABNORMAL HIGH (ref 70–99)

## 2018-05-26 LAB — APTT: APTT: 37 s — AB (ref 24–36)

## 2018-05-26 MED ORDER — HYDRALAZINE HCL 25 MG PO TABS: 25.0000 mg | ORAL_TABLET | Freq: Four times a day (QID) | ORAL | Status: DC | PRN

## 2018-05-26 NOTE — ED Triage Notes (Signed)
Pt reports left sided weakness, 'left side just felt funny', approx 1700 today. Family called [facility], mentation is normal.

## 2018-05-26 NOTE — ED Provider Notes (Addendum)
[facility] EMERGENCY DEPARTMENT Provider Note   CSN: [id number] Arrival date & time: 05/26/18  2229     History   Chief Complaint Chief Complaint  Patient presents with  . Weakness    HPI [name] is a 84 y.o. male.  Level 5 caveat for acuity of condition.  Patient presents via EMS with left arm and left leg weakness with associated slurred speech and facial drooping at approximately 1700 today.  No mental status changes.  Past medical history includes hypertension and COPD.  No prodromal illnesses.     Past Medical History:  Diagnosis Date  . COPD (chronic obstructive pulmonary disease) ([place])   . Hypertension     Patient Active Problem List   Diagnosis Date Noted  . Hyponatremia 10/19/2014  . Chest pain 10/19/2014  . HTN (hypertension) 10/19/2014  . COPD (chronic obstructive pulmonary disease) ([place]) 10/19/2014    Past Surgical History:  Procedure Laterality Date  . BACK SURGERY    . COLONOSCOPY     polyps removed        Home Medications    Prior to Admission medications   Medication Sig Start Date End Date Taking? Authorizing Provider  albuterol (PROVENTIL HFA;VENTOLIN HFA) 108 (90 BASE) MCG/ACT inhaler Inhale 2 puffs into the lungs every 6 (six) hours as needed for wheezing.    [provider]  albuterol (PROVENTIL) (2.5 MG/3ML) 0.083% nebulizer solution Take 3 mLs (2.5 mg total) by nebulization every 4 (four) hours as needed for wheezing. 01/25/13   Nat Christen, MD  amLODipine (NORVASC) 5 MG tablet Take 5 mg by mouth daily. 09/28/14   [provider]  naproxen sodium (ANAPROX) 275 MG tablet Take 1 tablet (275 mg total) by mouth 2 (two) times daily with a meal. 03/06/16   Margarita Mail, PA-C  tamsulosin (FLOMAX) 0.4 MG CAPS capsule Take 1 capsule by mouth daily. 09/28/14   [provider]  traMADol (ULTRAM) 50 MG tablet Take 50 mg by mouth 3 (three) times daily.    [provider]  valsartan (DIOVAN) 160 MG tablet  Take 1 tablet (160 mg total) by mouth daily. 10/23/14   Rosita Fire, MD    Family History Family History  Family history unknown: Yes    Social History Social History   Tobacco Use  . Smoking status: Current Every Day Smoker    Packs/day: 1.00    Types: Cigarettes  . Smokeless tobacco: Never Used  Substance Use Topics  . Alcohol use: No  . Drug use: No     Allergies   Patient has no known allergies.   Review of Systems Review of Systems  Unable to perform ROS: Acuity of condition     Physical Exam Updated Vital Signs BP 136/89   Resp (!) 22   Ht 5\' 11"  (1.803 m)   Wt 58.1 kg   BMI 17.85 kg/m   Physical Exam  Constitutional: He is oriented to person, place, and time. He appears well-developed and well-nourished.  Alert, conversant  HENT:  Head: Normocephalic and atraumatic.  Eyes: Conjunctivae are normal.  Neck: Neck supple.  Cardiovascular: Normal rate and regular rhythm.  Pulmonary/Chest: Effort normal and breath sounds normal.  Abdominal: Soft. Bowel sounds are normal.  Musculoskeletal: Normal range of motion.  Neurological: He is alert and oriented to person, place, and time.  Left arm and leg weakness.  Skin: Skin is warm and dry.  Psychiatric: He has a normal mood and affect. His behavior is normal.  Nursing note and vitals reviewed.    ED Treatments / Results  Labs (all labs ordered are listed, but only abnormal results are displayed) Labs Reviewed  APTT - Abnormal; Notable for the following components:      Result Value   aPTT 37 (*)    All other components within normal limits  DIFFERENTIAL - Abnormal; Notable for the following components:   Neutro Abs 8.2 (*)    All other components within normal limits  COMPREHENSIVE METABOLIC PANEL - Abnormal; Notable for the following components:   Sodium 132 (*)    CO2 21 (*)    Glucose, Bld 136 (*)    GFR calc non Af Amer 52 (*)    All other components within normal limits  I-STAT CHEM 8, ED  - Abnormal; Notable for the following components:   Sodium 134 (*)    Glucose, Bld 133 (*)    All other components within normal limits  CBG MONITORING, ED - Abnormal; Notable for the following components:   Glucose-Capillary 122 (*)    All other components within normal limits  PROTIME-INR  CBC  ETHANOL  RAPID URINE DRUG SCREEN, HOSP PERFORMED  URINALYSIS, ROUTINE W REFLEX MICROSCOPIC  I-STAT TROPONIN, ED    EKG EKG Interpretation  Date/Time:  Wednesday May 26 2018 22:39:30 EDT Ventricular Rate:  94 PR Interval:    QRS Duration: 120 QT Interval:  376 QTC Calculation: 471 R Axis:   -126 Text Interpretation:  Atrial fibrillation Incomplete RBBB and LAFB Probable lateral infarct, age indeterminate Anteroseptal infarct, age indeterminate Baseline wander in lead(s) V5 V6 Confirmed by Nat Christen [phone]) on 05/26/2018 11:13:51 PM   Radiology Ct Head Code Stroke Wo Contrast  Result Date: 05/26/2018 CLINICAL DATA:  Code stroke.  Left leg weakness EXAM: CT HEAD WITHOUT CONTRAST TECHNIQUE: Contiguous axial images were obtained from the base of the skull through the vertex without intravenous contrast. COMPARISON:  None. FINDINGS: Brain: There is no mass, hemorrhage or extra-axial collection. There is generalized atrophy without lobar predilection. There is no acute or chronic infarction. There is hypoattenuation of the periventricular white matter, most commonly indicating chronic ischemic microangiopathy. Vascular: No abnormal hyperdensity of the major intracranial arteries or dural venous sinuses. No intracranial atherosclerosis. Skull: The visualized skull base, calvarium and extracranial soft tissues are normal. Sinuses/Orbits: No fluid levels or advanced mucosal thickening of the visualized paranasal sinuses. No mastoid or middle ear effusion. The orbits are normal. ASPECTS [facility] Stroke Program Early CT Score) - Ganglionic level infarction (caudate, lentiform nuclei, internal  capsule, insula, M1-M3 cortex): 7 - Supraganglionic infarction (M4-M6 cortex): 3 Total score (0-10 with 10 being normal): 10 IMPRESSION: 1. No intracranial hemorrhage. 2. ASPECTS is 10. These results were called by telephone at the time of interpretation on 05/26/2018 at 10:49 pm to Dr. Nat Christen , who verbally acknowledged these results. Electronically Signed   By: Ulyses Jarred M.D.   On: 05/26/2018 22:50    Procedures Procedures (including critical care time)  Medications Ordered in ED Medications - No data to display   Initial Impression / Assessment and Plan / ED Course  I have reviewed the triage vital signs and the nursing notes.  Pertinent labs & imaging results that were available during my care of the patient were reviewed by me and considered in my medical decision making (see chart for details).     Patient presents with left arm and left leg weakness at approximately 1700 today.  Code stroke initiated.  Patient  taken to CT scan immediately.  CT had negative.  Tele-neurologist interviewed patient stat.  Left-sided arm and leg weakness has improved.  Patient continues to have a minimal amount of slurred speech and facial droop.  Will admit for further work-up.   CRITICAL CARE Performed by: Nat Christen Total critical care time: 35 minutes Critical care time was exclusive of separately billable procedures and treating other patients. Critical care was necessary to treat or prevent imminent or life-threatening deterioration. Critical care was time spent personally by me on the following activities: development of treatment plan with patient and/or surrogate as well as nursing, discussions with consultants, evaluation of patient's response to treatment, examination of patient, obtaining history from patient or surrogate, ordering and performing treatments and interventions, ordering and review of laboratory studies, ordering and review of radiographic studies, pulse oximetry and  re-evaluation of patient's condition. Final Clinical Impressions(s) / ED Diagnoses   Final diagnoses:  Cerebrovascular accident (CVA), unspecified mechanism [facility])    ED Discharge Orders    None       Nat Christen, MD 05/26/18 2233    Nat Christen, MD 05/26/18 2316

## 2018-05-26 NOTE — ED Notes (Signed)
DR Darrick Meigs made aware of high B/P

## 2018-05-26 NOTE — Consult Note (Signed)
[facility] Tele[facility]urology Consult Services  Date of Service 05/26/2018 Seen as STAT consult Impression:  1-  slurred speech and left sided weakness consistent with a small Acute Ischemic Stroke  Recommendations:  Patient is not a tPA candidate Head CT did not show any acute hemorrhage. reviewed report (if available) and images  Reason: _ last time known well>4.5 hours Patient is not a NIR candidate: _ Thrombectomy not considered since large proximal intracranial vessel occlusion is not suspected. dysphagia screen ASA if no contraindications head of bed flat IV fluids NS Stroke work up with: noncontrast brain MRI, head and neck MRA (or CTA), 2D ECHO, lipid panel, HbA1c (Goal LDL<70, HbA1c<7) Physical Therapy/Occupational Therapy/Speech Therapy  inpatient neurology consultation if available Inpatient stroke evaluation as per Neurology/ Internal Medicine Discussed with ED physician/medical staff Please contact [facility] Navigator to reach me if further questions/concerns arise.  ---------------------------------------------------------------------  Reason for Neurology Consult: - slurred speech  History of Present Illness:  -  Patient is a(n) 84 years old male with history of hypertension and COPD. presents via EMS with left arm and left leg weakness at approximately 1700 today  Diagnostic Testing: - Head CT: No acute Intracranial abnormality.  Review of Systems:  Constitutional:  Negative except as documented in history of present illness.   Eye:  Negative except as documented in history of present illness.   Ear/Nose/Mouth/Throat:  Negative except as documented in history of present illness.   Respiratory:  Negative except as documented in history of present illness.   Cardiovascular:  Negative except as documented in history of present illness.   Gastrointestinal:  Negative except as documented in history of present illness.   Musculoskeletal:  Negative except  as documented in history of present illness.   Neurologic:  Negative except as documented in history of present illness.     Examination:   # NIHSS - NIH STROKE SCALE - 11 items:  1A: Level of consciousness: 0 - Alert; keenly responsive.  1B: Ask month and age: 59 - Both questions right  1C: 'Blink eyes' & 'squeeze hands': 0 - Performs both tasks  2: Horizontal extraocular movements: 0 - Normal.  3: Visual fields: 0 - No visual loss.  4: Facial palsy: 2 - Partial paralysis (lower face).   5A: Left arm motor drift: 0 - No drift for 10 seconds, or Amputation/joint fusion.  5B: Right arm motor drift: 0 - No drift for 10 seconds, or Amputation/joint fusion.  6A: Left leg motor drift: 0 - No drift for 5 seconds, or Amputation/joint fusion.  6B: Right leg motor drift: 0 - No drift for 5 seconds, or Amputation/joint fusion.  7: Limb Ataxia: 0 - No ataxia, or does not understand, or Paralyzed, or Amputation/joint fusion.  8: Sensation: 0 - Normal; no sensory loss.  9: Language/aphasia: 0 - Normal; no aphasia.  10: Dysarthria: 1 - Mild-moderate dysarthria: slurring but can be understood.  11: Extinction/inattention: 0 - No abnormality   == Score: 3.   Medical Decision Making:  - Extensive number of diagnosis or management options are considered above.   - Extensive amount of complex data reviewed.   - High risk of complication and/or morbidity or mortality are associated with differential diagnostic considerations above.  - There may be uncertain outcome and increased probability of prolonged functional impairment or high probability of severe prolonged functional impairment associated with some of these differential diagnoses.   Medical Data Reviewed:  1.Data reviewed include clinical labs, radiology, Medical Tests;   2.Tests  results discussed w/performing or interpreting physician;   3.Obtaining/reviewing old medical records;  4.Obtaining case history from another source;   5.Independent review of image, tracing or specimen.   When possible Patient/family were informed the Neurology Consult would happen via TeleHealth consult by way of interactive audio and video telecommunications and consented to receiving care in this manner.

## 2018-05-27 ENCOUNTER — Other Ambulatory Visit: Payer: Self-pay

## 2018-05-27 ENCOUNTER — Observation Stay (HOSPITAL_COMMUNITY): Payer: Medicare Other

## 2018-05-27 ENCOUNTER — Encounter (HOSPITAL_COMMUNITY): Payer: Self-pay | Admitting: *Deleted

## 2018-05-27 DIAGNOSIS — J850 Gangrene and necrosis of lung: Secondary | ICD-10-CM | POA: Diagnosis not present

## 2018-05-27 DIAGNOSIS — G8194 Hemiplegia, unspecified affecting left nondominant side: Secondary | ICD-10-CM | POA: Diagnosis not present

## 2018-05-27 DIAGNOSIS — I639 Cerebral infarction, unspecified: Secondary | ICD-10-CM

## 2018-05-27 DIAGNOSIS — Z79899 Other long term (current) drug therapy: Secondary | ICD-10-CM | POA: Diagnosis not present

## 2018-05-27 DIAGNOSIS — I6340 Cerebral infarction due to embolism of unspecified cerebral artery: Secondary | ICD-10-CM | POA: Diagnosis not present

## 2018-05-27 DIAGNOSIS — Z7951 Long term (current) use of inhaled steroids: Secondary | ICD-10-CM | POA: Diagnosis not present

## 2018-05-27 DIAGNOSIS — R471 Dysarthria and anarthria: Secondary | ICD-10-CM | POA: Diagnosis not present

## 2018-05-27 DIAGNOSIS — I6523 Occlusion and stenosis of bilateral carotid arteries: Secondary | ICD-10-CM | POA: Diagnosis not present

## 2018-05-27 DIAGNOSIS — I10 Essential (primary) hypertension: Secondary | ICD-10-CM | POA: Diagnosis not present

## 2018-05-27 DIAGNOSIS — R29818 Other symptoms and signs involving the nervous system: Secondary | ICD-10-CM | POA: Diagnosis not present

## 2018-05-27 DIAGNOSIS — E876 Hypokalemia: Secondary | ICD-10-CM | POA: Diagnosis not present

## 2018-05-27 DIAGNOSIS — F17210 Nicotine dependence, cigarettes, uncomplicated: Secondary | ICD-10-CM | POA: Diagnosis not present

## 2018-05-27 DIAGNOSIS — J439 Emphysema, unspecified: Secondary | ICD-10-CM | POA: Diagnosis not present

## 2018-05-27 DIAGNOSIS — J449 Chronic obstructive pulmonary disease, unspecified: Secondary | ICD-10-CM | POA: Diagnosis not present

## 2018-05-27 DIAGNOSIS — R29703 NIHSS score 3: Secondary | ICD-10-CM | POA: Diagnosis not present

## 2018-05-27 HISTORY — DX: Cerebral infarction, unspecified: I63.9

## 2018-05-27 LAB — LIPID PANEL
Cholesterol: 129 mg/dL (ref 0–200)
HDL: 60 mg/dL (ref >40)
LDL CALC: 56 mg/dL (ref 0–99)
Total CHOL/HDL Ratio: 2.2 RATIO
Triglycerides: 63 mg/dL (ref <150)
VLDL: 13 mg/dL (ref 0–40)

## 2018-05-27 LAB — COMPREHENSIVE METABOLIC PANEL
ALT: 10 U/L (ref 0–44)
AST: 22 U/L (ref 15–41)
Albumin: 3.8 g/dL (ref 3.5–5.0)
Alkaline Phosphatase: 73 U/L (ref 38–126)
Anion gap: 12 (ref 5–15)
BUN: 14 mg/dL (ref 8–23)
CHLORIDE: 103 mmol/L (ref 98–111)
CO2: 19 mmol/L — AB (ref 22–32)
Calcium: 9.5 mg/dL (ref 8.9–10.3)
Creatinine, Ser: 1.15 mg/dL (ref 0.61–1.24)
GFR, EST NON AFRICAN AMERICAN: 57 mL/min — AB (ref >60)
Glucose, Bld: 116 mg/dL — ABNORMAL HIGH (ref 70–99)
POTASSIUM: 3.7 mmol/L (ref 3.5–5.1)
SODIUM: 134 mmol/L — AB (ref 135–145)
Total Bilirubin: 0.7 mg/dL (ref 0.3–1.2)
Total Protein: 6.7 g/dL (ref 6.5–8.1)

## 2018-05-27 LAB — RAPID URINE DRUG SCREEN, HOSP PERFORMED
Amphetamines: NOT DETECTED
BENZODIAZEPINES: NOT DETECTED
Barbiturates: NOT DETECTED
COCAINE: NOT DETECTED
Opiates: NOT DETECTED
Tetrahydrocannabinol: NOT DETECTED

## 2018-05-27 LAB — URINALYSIS, ROUTINE W REFLEX MICROSCOPIC
BILIRUBIN URINE: NEGATIVE
Bacteria, UA: NONE SEEN
Glucose, UA: NEGATIVE mg/dL
Ketones, ur: NEGATIVE mg/dL
Leukocytes, UA: NEGATIVE
Nitrite: NEGATIVE
Protein, ur: 30 mg/dL — AB
SPECIFIC GRAVITY, URINE: 1.009 (ref 1.005–1.030)
pH: 6 (ref 5.0–8.0)

## 2018-05-27 LAB — HEMOGLOBIN A1C
HEMOGLOBIN A1C: 5.2 % (ref 4.8–5.6)
MEAN PLASMA GLUCOSE: 102.54 mg/dL

## 2018-05-27 LAB — MAGNESIUM: MAGNESIUM: 2 mg/dL (ref 1.7–2.4)

## 2018-05-27 MED ORDER — DIGOXIN 0.25 MG/ML IJ SOLN
0.2500 mg | Freq: Once | INTRAMUSCULAR | Status: AC
  Administered 2018-05-27: 0.25 mg via INTRAVENOUS
  Filled 2018-05-27: qty 2

## 2018-05-27 MED ORDER — ALBUTEROL SULFATE (2.5 MG/3ML) 0.083% IN NEBU
2.5000 mg | INHALATION_SOLUTION | Freq: Four times a day (QID) | RESPIRATORY_TRACT | Status: DC | PRN
  Administered 2018-05-27: 2.5 mg via RESPIRATORY_TRACT
  Filled 2018-05-27: qty 3

## 2018-05-27 MED ORDER — ASPIRIN 300 MG RE SUPP
300.0000 mg | Freq: Every day | RECTAL | Status: DC
  Administered 2018-05-28: 300 mg via RECTAL
  Filled 2018-05-27: qty 1

## 2018-05-27 MED ORDER — ALBUTEROL SULFATE HFA 108 (90 BASE) MCG/ACT IN AERS
2.0000 | INHALATION_SPRAY | Freq: Four times a day (QID) | RESPIRATORY_TRACT | Status: DC | PRN
  Administered 2018-05-27: 2 via RESPIRATORY_TRACT
  Filled 2018-05-27: qty 6.7

## 2018-05-27 MED ORDER — SODIUM CHLORIDE 0.9 % IV BOLUS
500.0000 mL | Freq: Once | INTRAVENOUS | Status: AC
  Administered 2018-05-27: 500 mL via INTRAVENOUS

## 2018-05-27 MED ORDER — ALBUTEROL SULFATE (2.5 MG/3ML) 0.083% IN NEBU
2.5000 mg | INHALATION_SOLUTION | Freq: Four times a day (QID) | RESPIRATORY_TRACT | Status: DC | PRN
  Administered 2018-05-28: 2.5 mg via RESPIRATORY_TRACT
  Filled 2018-05-27: qty 3

## 2018-05-27 MED ORDER — ENOXAPARIN SODIUM 40 MG/0.4ML ~~LOC~~ SOLN
40.0000 mg | SUBCUTANEOUS | Status: DC
  Administered 2018-05-27 – 2018-05-29 (×3): 40 mg via SUBCUTANEOUS
  Filled 2018-05-27 (×3): qty 0.4

## 2018-05-27 MED ORDER — DILTIAZEM HCL 25 MG/5ML IV SOLN
10.0000 mg | Freq: Once | INTRAVENOUS | Status: AC
  Administered 2018-05-27: 10 mg via INTRAVENOUS
  Filled 2018-05-27: qty 5

## 2018-05-27 MED ORDER — SENNOSIDES-DOCUSATE SODIUM 8.6-50 MG PO TABS: 1.0000 | ORAL_TABLET | Freq: Every evening | ORAL | Status: DC | PRN

## 2018-05-27 MED ORDER — ASPIRIN 325 MG PO TABS
325.0000 mg | ORAL_TABLET | Freq: Every day | ORAL | Status: DC
  Administered 2018-05-27 – 2018-05-30 (×3): 325 mg via ORAL
  Filled 2018-05-27 (×3): qty 1

## 2018-05-27 MED ORDER — STROKE: EARLY STAGES OF RECOVERY BOOK
Freq: Once | Status: DC
  Filled 2018-05-27: qty 1

## 2018-05-27 MED ORDER — SODIUM CHLORIDE 0.9 % IV SOLN
INTRAVENOUS | Status: DC
  Administered 2018-05-27 (×2): via INTRAVENOUS

## 2018-05-27 MED ORDER — METOPROLOL TARTRATE 5 MG/5ML IV SOLN
2.5000 mg | Freq: Once | INTRAVENOUS | Status: AC
  Administered 2018-05-27: 2.5 mg via INTRAVENOUS
  Filled 2018-05-27: qty 5

## 2018-05-27 NOTE — Progress Notes (Signed)
Pt would like [place] to contact granddaughter Wells Guiles [phone] if any questions arise.

## 2018-05-27 NOTE — Progress Notes (Signed)
Pt ambulated to restroom with front wheel walker and 1 assist.  Pt currently back in bed, bed alarm on

## 2018-05-27 NOTE — Progress Notes (Addendum)
PROGRESS NOTE  [name] YKZ:[id number] DOB: 1932-10-06 DOA: 05/26/2018 PCP: Fran Lowes, MD   LOS: 0 days   Brief narrative:  Patient is 84 years old male with past medical history of COPD, hypertension presented to the hospital with slurred speech and left-sided weakness.  Telemetry stroke was consulted.  CT scan did not show any acute hemorrhage but he was determined not to be a TPA candidate.  Patient was then admitted to hospital.  MRI of the brain showed right lenticular nucleus infarct.  Carotid duplex ultrasound showed a more than 70% stenosis in the right internal carotid artery, 50 to 69% stenosis on the left internal carotid artery and occlusion of the right vertebral artery.  Assessment/Plan:  Active Problems:   Stroke (cerebrum) (HCC)  Right middle cerebral artery territory infarct with right internal carotid artery more than 70% stenosis.  Left internal carotid artery 50 to 69% stenosis.  Patient does have right vertebral artery occlusion.  Patient is on a  Stroke protocol.  Patient does have  left-sided weakness upper more than the lower.  Does have left facial palsy.   Lipid profile reviewed.   Currently n.p.o. awaiting for speech swallow evaluation.  Continue on gentle fluid hydration.  I spoke with Dr. Carlis Abbott, [facility] vascular surgery and he recommended transfer for further evaluation and care.  History of hypertension.  Currently on permissive hypertension.  Patient was on Norvasc and Diovan at home.    COPD.  On nebulizers.  No acute exacerbation.  VTE Prophylaxis: Lovenox  Code Status:  Full code  Family Communication: Spoke with the patient's son and updated him about the clinical condition of the patient and the need for transfer to [place] with bed placement regarding transfer. Spoke with Dr. Broadus John, hospitalist at [facility] who has accepted the transfer. Dr Carlis Abbott, vascular surgery has accepted the patient for consultation.  Disposition  Plan:  [facility] for further care.  Consultants:  Teleneurology, spoke with vascular surgery clark on the phone.  Procedures:  MRI/CT of the brain, carotid Doppler ultrasound  Antibiotics:  None  Subjective: Complains of a left upper extremity weakness.  Denies any headache nausea vomiting, patient is communicative.  Objective: Vitals:   05/27/18 0715 05/27/18 0905  BP: (!) 157/81 (!) 170/102  Pulse: 86 (!) 121  Resp:  (!) 22  Temp:  97.7 F (36.5 C)  SpO2: 96% 97%    Intake/Output Summary (Last 24 hours) at 05/27/2018 1059 Last data filed at 05/27/2018 0900 Gross per 24 hour  Intake 0 ml  Output -  Net 0 ml   Filed Weights   05/26/18 2249  Weight: 58.1 kg    Physical Exam:  General:  Alert awake, not in obvious distress.  Thinly built  HEENT: PERRLA , oral mucosa is moist.  No pharyngeal erythema  CVS: S1-S2 heard, no murmur.  Regular rate and rhythm.  Respiratory: Diminished breath sounds bilaterally.  No crackles or wheezes  Abdomen: Soft, nontender bowel sounds are present.  Nondistended  CNS: Alert awake oriented.  Left-sided facial palsy.  Left upper extremity and lower extremity weakness , more on the upper extremity.  SKIN: Dry. No rashes.   Data Review: I have personally reviewed the following laboratory data and studies  CBC: Recent Labs  Lab 05/26/18 2240 05/26/18 2246  WBC 10.3  --   NEUTROABS 8.2*  --   HGB 14.9 16.7  HCT 45.7 49.0  MCV 91.0  --   PLT  237  --    Basic Metabolic Panel: Recent Labs  Lab 05/26/18 2240 05/26/18 2246  NA 132* 134*  K 3.8 3.9  CL 102 100  CO2 21*  --   GLUCOSE 136* 133*  BUN 21 21  CREATININE 1.24 1.20  CALCIUM 9.4  --    Liver Function Tests: Recent Labs  Lab 05/26/18 2240  AST 18  ALT 11  ALKPHOS 83  BILITOT 0.6  PROT 7.7  ALBUMIN 4.4   No results for input(s): LIPASE, AMYLASE in the last 168 hours. No results for input(s): AMMONIA in the last 168 hours. Cardiac Enzymes: No  results for input(s): CKTOTAL, CKMB, CKMBINDEX, TROPONINI in the last 168 hours. BNP (last 3 results) No results for input(s): BNP in the last 8760 hours.  ProBNP (last 3 results) No results for input(s): PROBNP in the last 8760 hours.  CBG: Recent Labs  Lab 05/26/18 2242  GLUCAP 122*   No results found for this or any previous visit (from the past 240 hour(s)).   Studies: Dg Chest 2 View  Result Date: 05/27/2018 CLINICAL DATA:  Stroke-like symptoms EXAM: CHEST - 2 VIEW COMPARISON:  10/19/2014 FINDINGS: Linear atelectasis or scar in the right upper lobe. Hyperinflated lungs. No focal opacity or pleural effusion. Normal cardiomediastinal silhouette with aortic atherosclerosis. Emphysematous changes. Multiple mild lower thoracic compression deformities. IMPRESSION: Hyperinflation with emphysematous changes and atelectasis or scarring in the right upper lobe. Electronically Signed   By: Donavan Foil M.D.   On: 05/27/2018 00:49   Mr Brain Wo Contrast  Result Date: 05/27/2018 CLINICAL DATA:  Focal neuro deficit less than 6 hours. Suspect stroke. Left-sided weakness. EXAM: MRI HEAD WITHOUT CONTRAST MRA HEAD WITHOUT CONTRAST TECHNIQUE: Multiplanar, multiecho pulse sequences of the brain and surrounding structures were obtained without intravenous contrast. Angiographic images of the head were obtained using MRA technique without contrast. COMPARISON:  CT head 05/26/2018 FINDINGS: MRI HEAD FINDINGS Brain: Acute infarct right lenticular nucleus involving the body of the caudate and deep white matter tracts. No other acute infarct. Moderate atrophy. Chronic microvascular ischemic changes in the white matter. Negative for hemorrhage or mass. Vascular: Normal arterial flow voids Skull and upper cervical spine: Negative Sinuses/Orbits: Bilateral cataract surgery. Paranasal sinuses clear. Other: None MRA HEAD FINDINGS Left vertebral artery widely patent and supplies the basilar. Distal right vertebral  artery not visualized. PICA patent on the left. Superior cerebellar artery patent bilaterally. Posterior cerebral arteries have decreased signal distally which may be artifactual or due to proximal stenosis. Internal carotid artery patent bilaterally. Mild atherosclerotic stenosis in the cavernous carotid bilaterally. Anterior cerebral arteries patent bilaterally. Decreased signal in the middle cerebral artery branches bilaterally which may be due to severe M1 segment stenosis bilaterally versus artifact. IMPRESSION: Acute infarct right lenticular nucleus. Moderate atrophy and chronic microvascular ischemia. Occluded right vertebral artery. Decreased signal in the posterior cerebral arteries bilaterally which may be due to proximal severe stenosis. Decreased signal in the middle cerebral arteries bilaterally likely due to atherosclerotic stenosis at the bifurcation bilaterally. These results will be called to the ordering clinician or representative by the Radiologist Assistant, and communication documented in the PACS or zVision Dashboard. Electronically Signed   By: Franchot Gallo M.D.   On: 05/27/2018 08:11   US Carotid Bilateral (at [facility] And Ap Only)  Result Date: 05/27/2018 CLINICAL DATA:  Stroke EXAM: BILATERAL CAROTID DUPLEX ULTRASOUND TECHNIQUE: Pearline Cables scale imaging, color Doppler and duplex ultrasound were performed of bilateral carotid and vertebral arteries in the  neck. COMPARISON:  None. FINDINGS: Criteria: Quantification of carotid stenosis is based on velocity parameters that correlate the residual internal carotid diameter with NASCET-based stenosis levels, using the diameter of the distal internal carotid lumen as the denominator for stenosis measurement. The following velocity measurements were obtained: RIGHT ICA: 410 cm/sec CCA: 75 cm/sec SYSTOLIC ICA/CCA RATIO:  5.5 ECA: 218 cm/sec LEFT ICA: 39 cm/sec CCA: 403 cm/sec SYSTOLIC ICA/CCA RATIO:  0.7 ECA: 153 cm/sec RIGHT CAROTID ARTERY: Focal  calcified plaque in the lower common carotid artery. Scattered calcified plaque along the wall of the mid and upper common carotid artery. Advanced calcified plaque in the bulb with shadowing of the lumen. Low resistance internal carotid Doppler pattern is preserved. Calcified plaque extends into the lower internal carotid artery. RIGHT VERTEBRAL ARTERY:  Nonvisualized LEFT CAROTID ARTERY: There is extensive irregular calcified plaque in the lower common carotid artery. There is prominent smooth mixed plaque in the mid and upper common carotid artery. There is advanced calcified plaque in the bulb with shadowing of the lumen. LEFT VERTEBRAL ARTERY:  Antegrade. IMPRESSION: Greater than 70% stenosis in the right internal carotid artery. 50-69% stenosis in the left internal carotid artery. Right vertebral artery was nonvisualized. Left vertebral artery is antegrade in flow. There is extensive plaque throughout both common carotid arteries and bulbs as described above. Electronically Signed   By: Marybelle Killings M.D.   On: 05/27/2018 08:57   Mr Jodene Nam Head/brain KV Cm  Result Date: 05/27/2018 CLINICAL DATA:  Focal neuro deficit less than 6 hours. Suspect stroke. Left-sided weakness. EXAM: MRI HEAD WITHOUT CONTRAST MRA HEAD WITHOUT CONTRAST TECHNIQUE: Multiplanar, multiecho pulse sequences of the brain and surrounding structures were obtained without intravenous contrast. Angiographic images of the head were obtained using MRA technique without contrast. COMPARISON:  CT head 05/26/2018 FINDINGS: MRI HEAD FINDINGS Brain: Acute infarct right lenticular nucleus involving the body of the caudate and deep white matter tracts. No other acute infarct. Moderate atrophy. Chronic microvascular ischemic changes in the white matter. Negative for hemorrhage or mass. Vascular: Normal arterial flow voids Skull and upper cervical spine: Negative Sinuses/Orbits: Bilateral cataract surgery. Paranasal sinuses clear. Other: None MRA HEAD  FINDINGS Left vertebral artery widely patent and supplies the basilar. Distal right vertebral artery not visualized. PICA patent on the left. Superior cerebellar artery patent bilaterally. Posterior cerebral arteries have decreased signal distally which may be artifactual or due to proximal stenosis. Internal carotid artery patent bilaterally. Mild atherosclerotic stenosis in the cavernous carotid bilaterally. Anterior cerebral arteries patent bilaterally. Decreased signal in the middle cerebral artery branches bilaterally which may be due to severe M1 segment stenosis bilaterally versus artifact. IMPRESSION: Acute infarct right lenticular nucleus. Moderate atrophy and chronic microvascular ischemia. Occluded right vertebral artery. Decreased signal in the posterior cerebral arteries bilaterally which may be due to proximal severe stenosis. Decreased signal in the middle cerebral arteries bilaterally likely due to atherosclerotic stenosis at the bifurcation bilaterally. These results will be called to the ordering clinician or representative by the Radiologist Assistant, and communication documented in the PACS or zVision Dashboard. Electronically Signed   By: Franchot Gallo M.D.   On: 05/27/2018 08:11   Ct Head Code Stroke Wo Contrast  Result Date: 05/26/2018 CLINICAL DATA:  Code stroke.  Left leg weakness EXAM: CT HEAD WITHOUT CONTRAST TECHNIQUE: Contiguous axial images were obtained from the base of the skull through the vertex without intravenous contrast. COMPARISON:  None. FINDINGS: Brain: There is no mass, hemorrhage or extra-axial collection. There is  generalized atrophy without lobar predilection. There is no acute or chronic infarction. There is hypoattenuation of the periventricular white matter, most commonly indicating chronic ischemic microangiopathy. Vascular: No abnormal hyperdensity of the major intracranial arteries or dural venous sinuses. No intracranial atherosclerosis. Skull: The  visualized skull base, calvarium and extracranial soft tissues are normal. Sinuses/Orbits: No fluid levels or advanced mucosal thickening of the visualized paranasal sinuses. No mastoid or middle ear effusion. The orbits are normal. ASPECTS [facility] Stroke Program Early CT Score) - Ganglionic level infarction (caudate, lentiform nuclei, internal capsule, insula, M1-M3 cortex): 7 - Supraganglionic infarction (M4-M6 cortex): 3 Total score (0-10 with 10 being normal): 10 IMPRESSION: 1. No intracranial hemorrhage. 2. ASPECTS is 10. These results were called by telephone at the time of interpretation on 05/26/2018 at 10:49 pm to Dr. Nat Christen , who verbally acknowledged these results. Electronically Signed   By: Ulyses Jarred M.D.   On: 05/26/2018 22:50    Scheduled Meds: .  stroke: mapping our early stages of recovery book   Does not apply Once  . aspirin  300 mg Rectal Daily   Or  . aspirin  325 mg Oral Daily  . enoxaparin (LOVENOX) injection  40 mg Subcutaneous Q24H   Continuous Infusions: . sodium chloride 50 mL/hr at 05/27/18 0105    Time spent: 355 minutes. More than 50% of that time was spent in counseling and/or coordination of care.  Flora Lipps, MD  [facility] Pager [phone]  If 7PM-7AM, please contact night-coverage at [email or web address], password [facility] 05/27/2018, 10:59 AM

## 2018-05-27 NOTE — Evaluation (Signed)
Physical Therapy Evaluation Patient Details Name: [name] MRN: [id number] DOB: 26-Jan-1933 Today's Date: 05/27/2018   History of Present Illness   [name]  is a 84 y.o. male, with history of COPD, hypertension came to hospital with chief complaint of slurred speech and left-sided weakness.  Patient says that symptoms started around 5 PM today.  Code stroke was called in the ED, he was evaluated by tele neurology, acute stroke was suspected.  CT head showed no acute hemorrhage, patient not a TPA candidate, most of symptoms have resolved.  And also last time known well was more than 4.5 hours ago.    Clinical Impression  Patient demonstrates slow labored movement for sitting up at bedside requiring assistance, tends to lean on nearby objects for support when standing/taking steps, attempted use of single point cane [facility]), but patient very unsteady with incoordination of legs/scissoring, frequent near falls, safer using RW, but limited secondary to c/o fatigue.  Patient tolerated sitting up in chair with his son present in room after therapy.  Patient will benefit from continued physical therapy in hospital and recommended venue below to increase strength, balance, endurance for safe ADLs and gait.    Follow Up Recommendations SNF    Equipment Recommendations  Rolling walker with 5" wheels    Recommendations for Other Services       Precautions / Restrictions Precautions Precautions: Fall Restrictions Weight Bearing Restrictions: No      Mobility  Bed Mobility Overal bed mobility: Needs Assistance Bed Mobility: Supine to Sit     Supine to sit: Min assist     General bed mobility comments: slow labored movement   Transfers Overall transfer level: Needs assistance Equipment used: None;Straight cane;Rolling walker (2 wheeled) Transfers: Sit to/from [employer] transfer comment: tends to lean on nearby objects for support when not  using AD, safer using RW  Ambulation/Gait Ambulation/Gait assistance: Mod assist Gait Distance (Feet): 25 Feet Assistive device: Rolling walker (2 wheeled);Straight cane Gait Pattern/deviations: Decreased step length - left;Decreased stance time - left;Decreased step length - right;Decreased stride length Gait velocity: slow   General Gait Details: had to lean on nearby objects for support without AD, unsteady with SPC with frequent near loss of balance due to scissoring of legs/fair/poor LLE coordination, safer using RW demonstrating slow labored cadence with VC's to step closer to RW, limited mostly due to c/o fatigue  Stairs            Wheelchair Mobility    Modified Rankin (Stroke Patients Only)       Balance Overall balance assessment: Needs assistance Sitting-balance support: Feet supported;No upper extremity supported Sitting balance-Leahy Scale: Good     Standing balance support: During functional activity;No upper extremity supported Standing balance-Leahy Scale: Poor Standing balance comment: fair/poor with SPC, fair using RW                             Pertinent Vitals/Pain Pain Assessment: No/denies pain    Home Living Family/patient expects to be discharged to:: Private residence Living Arrangements: Children(his son lives with him) Available Help at Discharge: Family Type of Home: House Home Access: Ramped entrance     Home Layout: One level Home Equipment: Kasandra Knudsen - single point      Prior Function Level of Independence: Independent         Comments: community ambulator, drives  Hand Dominance   Dominant Hand: Left    Extremity/Trunk Assessment   Upper Extremity Assessment Upper Extremity Assessment: Defer to OT evaluation    Lower Extremity Assessment Lower Extremity Assessment: Generalized weakness;RLE deficits/detail;LLE deficits/detail RLE Deficits / Details: grossly 4+/5 LLE Deficits / Details: grossly -4/5     Cervical / Trunk Assessment Cervical / Trunk Assessment: Kyphotic  Communication   Communication: No difficulties  Cognition Arousal/Alertness: Awake/alert Behavior During Therapy: WFL for tasks assessed/performed Overall Cognitive Status: Within Functional Limits for tasks assessed                                        General Comments      Exercises     Assessment/Plan    PT Assessment Patient needs continued PT services  PT Problem List Decreased strength;Decreased activity tolerance;Decreased balance;Decreased mobility       PT Treatment Interventions Gait training;Stair training;Functional mobility training;Therapeutic activities;Therapeutic exercise;Patient/family education    PT Goals (Current goals can be found in the Care Plan section)  Acute Rehab PT Goals Patient Stated Goal: return home after rehab PT Goal Formulation: With patient/family Time For Goal Achievement: 06/10/18 Potential to Achieve Goals: Good    Frequency 7X/week   Barriers to discharge        Co-evaluation               AM-PAC PT "6 Clicks" Daily Activity  Outcome Measure Difficulty turning over in bed (including adjusting bedclothes, sheets and blankets)?: A Little Difficulty moving from lying on back to sitting on the side of the bed? : A Little Difficulty sitting down on and standing up from a chair with arms (e.g., wheelchair, bedside commode, etc,.)?: A Little Help needed moving to and from a bed to chair (including a wheelchair)?: A Lot Help needed walking in hospital room?: A Lot Help needed climbing 3-5 steps with a railing? : A Lot 6 Click Score: 15    End of Session Equipment Utilized During Treatment: Gait belt Activity Tolerance: Patient tolerated treatment well;Patient limited by fatigue Patient left: in chair;with call bell/phone within reach;with chair alarm set;with family/visitor present Nurse Communication: Mobility status PT Visit  Diagnosis: Unsteadiness on feet (R26.81);Other abnormalities of gait and mobility (R26.89);Muscle weakness (generalized) (M62.81)    Time: 2947-6546 PT Time Calculation (min) (ACUTE ONLY): 37 min   Charges:   PT Evaluation $PT Eval Moderate Complexity: 1 Mod PT Treatments $Therapeutic Activity: 23-37 mins        1:59 PM, 05/27/18 Lonell Grandchild, MPT Physical Therapist with [facility] 336 [phone] office [phone] mobile phone

## 2018-05-27 NOTE — Plan of Care (Signed)
  Problem: Acute Rehab PT Goals(only PT should resolve) Goal: Pt Will Go Supine/Side To Sit Outcome: Progressing Flowsheets (Taken 05/27/2018 1401) Pt will go Supine/Side to Sit: with supervision Goal: Patient Will Transfer Sit To/From Stand Outcome: Progressing Flowsheets (Taken 05/27/2018 1401) Patient will transfer sit to/from stand: with min guard assist Goal: Pt Will Transfer Bed To Chair/Chair To Bed Outcome: Progressing Flowsheets (Taken 05/27/2018 1401) Pt will Transfer Bed to Chair/Chair to Bed: min guard assist Goal: Pt Will Ambulate Outcome: Progressing Flowsheets (Taken 05/27/2018 1401) Pt will Ambulate: 50 feet; with rolling walker; with minimal assist   2:02 PM, 05/27/18 Lonell Grandchild, MPT Physical Therapist with [facility] [phone] office [phone] mobile phone

## 2018-05-27 NOTE — H&P (Signed)
TRH H&P    Patient Demographics:    [name], is a 84 y.o. male  MRN: [id number]  DOB - 11-11-1932  Admit Date - 05/26/2018  Referring MD/NP/PA: Dr. Lacinda Axon  Outpatient Primary MD for the patient is Fran Lowes, MD  Patient coming from: Home  Chief complaint-slurred speech   HPI:    [name]  is a 84 y.o. male, with history of COPD, hypertension came to hospital with chief complaint of slurred speech and left-sided weakness.  Patient says that symptoms started around 5 PM today.  Code stroke was called in the ED, he was evaluated by tele neurology, acute stroke was suspected.  CT head showed no acute hemorrhage, patient not a TPA candidate, most of symptoms have resolved.  And also last time known well was more than 4.5 hours ago. Patient denies any other symptoms, Denies chest pain or shortness of breath. Denies nausea vomiting or diarrhea. Denies dysuria, urgency or frequency of urination. No previous history of stroke or seizures.    Review of systems:      All other systems reviewed and are negative.   With Past History of the following :    Past Medical History:  Diagnosis Date  . COPD (chronic obstructive pulmonary disease) ([place])   . Hypertension       Past Surgical History:  Procedure Laterality Date  . BACK SURGERY    . COLONOSCOPY     polyps removed      Social History:      Social History   Tobacco Use  . Smoking status: Current Every Day Smoker    Packs/day: 1.00    Types: Cigarettes  . Smokeless tobacco: Never Used  Substance Use Topics  . Alcohol use: No       Family History :     Family History  Family history unknown: Yes   Patient says he does not remember his parents having history of stroke, heart disease or cancer.  No other family history patient can recall.   Home Medications:   Prior to Admission medications   Medication Sig Start  Date End Date Taking? Authorizing Provider  albuterol (PROVENTIL HFA;VENTOLIN HFA) 108 (90 BASE) MCG/ACT inhaler Inhale 2 puffs into the lungs every 6 (six) hours as needed for wheezing.    [provider]  albuterol (PROVENTIL) (2.5 MG/3ML) 0.083% nebulizer solution Take 3 mLs (2.5 mg total) by nebulization every 4 (four) hours as needed for wheezing. 01/25/13   Nat Christen, MD  amLODipine (NORVASC) 5 MG tablet Take 5 mg by mouth daily. 09/28/14   [provider]  naproxen sodium (ANAPROX) 275 MG tablet Take 1 tablet (275 mg total) by mouth 2 (two) times daily with a meal. 03/06/16   Margarita Mail, PA-C  tamsulosin (FLOMAX) 0.4 MG CAPS capsule Take 1 capsule by mouth daily. 09/28/14   [provider]  traMADol (ULTRAM) 50 MG tablet Take 50 mg by mouth 3 (three) times daily.    [provider]  valsartan (DIOVAN) 160 MG tablet Take 1 tablet (  160 mg total) by mouth daily. 10/23/14   Rosita Fire, MD     Allergies:    No Known Allergies   Physical Exam:   Vitals  Blood pressure (!) 133/94, pulse 62, resp. rate (!) 24, height 5' 11"  (1.803 m), weight 58.1 kg, SpO2 96 %.  1.  General: Appears in no acute distress  2. Psychiatric:  Intact judgement and  insight, awake alert, oriented x 3.  3. Neurologic: No focal neurological deficits, all cranial nerves intact.Strength 5/5 all 4 extremities, sensation intact all 4 extremities, plantars down going.  4. Eyes :  anicteric sclerae, moist conjunctivae with no lid lag. PERRLA.  5. ENMT:  Oropharynx clear with moist mucous membranes and good dentition  6. Neck:  supple, no cervical lymphadenopathy appriciated, No thyromegaly  7. Respiratory : Normal respiratory effort, good air movement bilaterally,clear to  auscultation bilaterally  8. Cardiovascular : RRR, no gallops, rubs or murmurs, no leg edema  9. Gastrointestinal:  Positive bowel sounds, abdomen soft, non-tender to palpation,no  hepatosplenomegaly, no rigidity or guarding       10. Skin:  No cyanosis, normal texture and turgor, no rash, lesions or ulcers  11.Musculoskeletal:  Good muscle tone,  joints appear normal , no effusions,  normal range of motion    Data Review:    CBC Recent Labs  Lab 05/26/18 2240 05/26/18 2246  WBC 10.3  --   HGB 14.9 16.7  HCT 45.7 49.0  PLT 237  --   MCV 91.0  --   MCH 29.7  --   MCHC 32.6  --   RDW 14.6  --   LYMPHSABS 1.2  --   MONOABS 0.6  --   EOSABS 0.1  --   BASOSABS 0.1  --    ------------------------------------------------------------------------------------------------------------------  Results for orders placed or performed during the hospital encounter of 05/26/18 (from the past 48 hour(s))  Ethanol     Status: None   Collection Time: 05/26/18 10:40 PM  Result Value Ref Range   Alcohol, Ethyl (B) <10 <10 mg/dL    Comment: Performed at [facility], [address], [place], [place] [address]  Protime-INR     Status: None   Collection Time: 05/26/18 10:40 PM  Result Value Ref Range   Prothrombin Time 12.7 11.4 - 15.2 seconds   INR 0.96     Comment: Performed at [facility], [address], [place], [place] [address]  APTT     Status: Abnormal   Collection Time: 05/26/18 10:40 PM  Result Value Ref Range   aPTT 37 (H) 24 - 36 seconds    Comment:        IF BASELINE aPTT IS ELEVATED, SUGGEST PATIENT RISK ASSESSMENT BE USED TO DETERMINE APPROPRIATE ANTICOAGULANT THERAPY. Performed at [facility], [address] [place], [place] [address]   CBC     Status: None   Collection Time: 05/26/18 10:40 PM  Result Value Ref Range   WBC 10.3 4.0 - 10.5 K/uL   RBC 5.02 4.22 - 5.81 MIL/uL   Hemoglobin 14.9 13.0 - 17.0 g/dL   HCT 45.7 39.0 - 52.0 %   MCV 91.0 80.0 - 100.0 fL   MCH 29.7 26.0 - 34.0 pg   MCHC 32.6 30.0 - 36.0 g/dL   RDW 14.6 11.5 - 15.5 %   Platelets 237 150 - 400 K/uL   nRBC 0.0 0.0 - 0.2 %    Comment: Performed at [facility],  [address], [place],  [place] [address]  Differential     Status: Abnormal   Collection Time: 05/26/18 10:40 PM  Result Value Ref Range   Neutrophils Relative % 79 %   Neutro Abs 8.2 (H) 1.7 - 7.7 K/uL   Lymphocytes Relative 12 %   Lymphs Abs 1.2 0.7 - 4.0 K/uL   Monocytes Relative 6 %   Monocytes Absolute 0.6 0.1 - 1.0 K/uL   Eosinophils Relative 1 %   Eosinophils Absolute 0.1 0.0 - 0.5 K/uL   Basophils Relative 1 %   Basophils Absolute 0.1 0.0 - 0.1 K/uL   Immature Granulocytes 1 %   Abs Immature Granulocytes 0.05 0.00 - 0.07 K/uL    Comment: Performed at [facility], [address] [place], [place] [address]  Comprehensive metabolic panel     Status: Abnormal   Collection Time: 05/26/18 10:40 PM  Result Value Ref Range   Sodium 132 (L) 135 - 145 mmol/L   Potassium 3.8 3.5 - 5.1 mmol/L   Chloride 102 98 - 111 mmol/L   CO2 21 (L) 22 - 32 mmol/L   Glucose, Bld 136 (H) 70 - 99 mg/dL   BUN 21 8 - 23 mg/dL   Creatinine, Ser 1.24 0.61 - 1.24 mg/dL   Calcium 9.4 8.9 - 10.3 mg/dL   Total Protein 7.7 6.5 - 8.1 g/dL   Albumin 4.4 3.5 - 5.0 g/dL   AST 18 15 - 41 U/L   ALT 11 0 - 44 U/L   Alkaline Phosphatase 83 38 - 126 U/L   Total Bilirubin 0.6 0.3 - 1.2 mg/dL   GFR calc non Af Amer 52 (L) >60 mL/min   GFR calc Af Amer >60 >60 mL/min    Comment: (NOTE) The eGFR has been calculated using the CKD EPI equation. This calculation has not been validated in all clinical situations. eGFR's persistently <60 mL/min signify possible Chronic Kidney Disease.    Anion gap 9 5 - 15    Comment: Performed at [facility], [address], [place], [place] [address]  CBG monitoring, ED     Status: Abnormal   Collection Time: 05/26/18 10:42 PM  Result Value Ref Range   Glucose-Capillary 122 (H) 70 - 99 mg/dL  I-stat troponin, ED     Status: None   Collection Time: 05/26/18 10:44 PM  Result Value Ref Range   Troponin i, poc 0.01 0.00 - 0.08 ng/mL   Comment 3            Comment: Due to the release  kinetics of cTnI, a negative result within the first hours of the onset of symptoms does not rule out myocardial infarction with certainty. If myocardial infarction is still suspected, repeat the test at appropriate intervals.   I-Stat Chem 8, ED     Status: Abnormal   Collection Time: 05/26/18 10:46 PM  Result Value Ref Range   Sodium 134 (L) 135 - 145 mmol/L   Potassium 3.9 3.5 - 5.1 mmol/L   Chloride 100 98 - 111 mmol/L   BUN 21 8 - 23 mg/dL   Creatinine, Ser 1.20 0.61 - 1.24 mg/dL   Glucose, Bld 133 (H) 70 - 99 mg/dL   Calcium, Ion 1.15 1.15 - 1.40 mmol/L   TCO2 22 22 - 32 mmol/L   Hemoglobin 16.7 13.0 - 17.0 g/dL   HCT 49.0 39.0 - 52.0 %    Chemistries  Recent Labs  Lab 05/26/18 2240 05/26/18 2246  NA 132* 134*  K 3.8 3.9  CL 102 100  CO2 21*  --   GLUCOSE 136* 133*  BUN 21 21  CREATININE 1.24 1.20  CALCIUM 9.4  --   AST 18  --   ALT 11  --   ALKPHOS 83  --   BILITOT 0.6  --    ------------------------------------------------------------------------------------------------------------------  ------------------------------------------------------------------------------------------------------------------ GFR: Estimated Creatinine Clearance: 38.3 mL/min (by C-G formula based on SCr of 1.2 mg/dL). Liver Function Tests: Recent Labs  Lab 05/26/18 2240  AST 18  ALT 11  ALKPHOS 83  BILITOT 0.6  PROT 7.7  ALBUMIN 4.4   No results for input(s): LIPASE, AMYLASE in the last 168 hours. No results for input(s): AMMONIA in the last 168 hours. Coagulation Profile: Recent Labs  Lab 05/26/18 2240  INR 0.96   Cardiac Enzymes: No results for input(s): CKTOTAL, CKMB, CKMBINDEX, TROPONINI in the last 168 hours. BNP (last 3 results) No results for input(s): PROBNP in the last 8760 hours. HbA1C: No results for input(s): HGBA1C in the last 72 hours. CBG: Recent Labs  Lab 05/26/18 2242  GLUCAP 122*   Lipid Profile: No results for input(s): CHOL, HDL, LDLCALC,  TRIG, CHOLHDL, LDLDIRECT in the last 72 hours. Thyroid Function Tests: No results for input(s): TSH, T4TOTAL, FREET4, T3FREE, THYROIDAB in the last 72 hours. Anemia Panel: No results for input(s): VITAMINB12, FOLATE, FERRITIN, TIBC, IRON, RETICCTPCT in the last 72 hours.  --------------------------------------------------------------------------------------------------------------- Urine analysis:    Component Value Date/Time   COLORURINE YELLOW 10/20/2014 1515   APPEARANCEUR CLEAR 10/20/2014 1515   LABSPEC <1.005 (L) 10/20/2014 1515   PHURINE 6.0 10/20/2014 1515   GLUCOSEU NEGATIVE 10/20/2014 1515   HGBUR TRACE (A) 10/20/2014 1515   BILIRUBINUR NEGATIVE 10/20/2014 1515   KETONESUR NEGATIVE 10/20/2014 1515   PROTEINUR NEGATIVE 10/20/2014 1515   UROBILINOGEN 1.0 10/20/2014 1515   NITRITE NEGATIVE 10/20/2014 1515   LEUKOCYTESUR NEGATIVE 10/20/2014 1515      Imaging Results:    Ct Head Code Stroke Wo Contrast  Result Date: 05/26/2018 CLINICAL DATA:  Code stroke.  Left leg weakness EXAM: CT HEAD WITHOUT CONTRAST TECHNIQUE: Contiguous axial images were obtained from the base of the skull through the vertex without intravenous contrast. COMPARISON:  None. FINDINGS: Brain: There is no mass, hemorrhage or extra-axial collection. There is generalized atrophy without lobar predilection. There is no acute or chronic infarction. There is hypoattenuation of the periventricular white matter, most commonly indicating chronic ischemic microangiopathy. Vascular: No abnormal hyperdensity of the major intracranial arteries or dural venous sinuses. No intracranial atherosclerosis. Skull: The visualized skull base, calvarium and extracranial soft tissues are normal. Sinuses/Orbits: No fluid levels or advanced mucosal thickening of the visualized paranasal sinuses. No mastoid or middle ear effusion. The orbits are normal. ASPECTS [facility] Stroke Program Early CT Score) - Ganglionic level infarction (caudate,  lentiform nuclei, internal capsule, insula, M1-M3 cortex): 7 - Supraganglionic infarction (M4-M6 cortex): 3 Total score (0-10 with 10 being normal): 10 IMPRESSION: 1. No intracranial hemorrhage. 2. ASPECTS is 10. These results were called by telephone at the time of interpretation on 05/26/2018 at 10:49 pm to Dr. Nat Christen , who verbally acknowledged these results. Electronically Signed   By: Ulyses Jarred M.D.   On: 05/26/2018 22:50    EKG- NSR    Assessment & Plan:    Active Problems:   Stroke (cerebrum) ([place])   1. Stroke versus TIA-patient came with symptoms of left-sided weakness and slurred speech, his weakness has almost resolved.  He is back to baseline.  Still has  mild slurred speech.  Neurologist recommended stroke work-up.  Will place under observation, check MRI/MRI brain in a.m., echocardiogram, lipid profile, hemoglobin A1c.,  Carotid Dopplers.  Start aspirin 325 mg p.o. Daily  2. Hypertension-blood pressure is elevated, will hold antihypertensive medications including Norvasc and Diovan.  Will start hydralazine 25 mg p.o. every 6 hours PRN for BP greater than 220/120.  3. COPD-continue PRN albuterol, no acute exacerbation at this time.   DVT Prophylaxis-   Lovenox   AM Labs Ordered, also please review Full Orders  Family Communication: Admission, patients condition and plan of care including tests being ordered have been discussed with the patient and his grand daughter at bedside who indicate understanding and agree with the plan and Code Status.  Code Status:  Full code  Admission status: Observation  Time spent in minutes : 60 min   Oswald Hillock M.D on 05/27/2018 at 12:13 AM  Between 7am to 7pm - Pager - [phone]. After 7pm go to [email or web address] - password [facility]  [facility] - Office  [phone]

## 2018-05-27 NOTE — Progress Notes (Signed)
Code stroke  Call time  2225 Beeper time  2225 Exam Started  2221 Exam ended  2226 Complete  2226 Rad   2230  EMS SHOWED UP WITH NO NOTIFICATION FROM ER.

## 2018-05-27 NOTE — Care Management Obs Status (Signed)
[place] NOTIFICATION   Patient Details  Name: [name] MRN: [id number] Date of Birth: 11-18-1932   Medicare Observation Status Notification Given:  Yes    Shannelle Alguire, Chauncey Reading, RN 05/27/2018, 11:06 AM

## 2018-05-27 NOTE — ED Notes (Signed)
Patient transported to MRI 

## 2018-05-27 NOTE — Care Management (Addendum)
Met briefly at bedside to discuss Observation with patient and son. Patient unavailable. Discussed OBS with son.  Patient very independent at baseline. MRI + for stroke. Son shares that patient will be potentially transferring to [facility] per MD .  Anticipate patient will transition to inpatient status.

## 2018-05-28 ENCOUNTER — Observation Stay (HOSPITAL_BASED_OUTPATIENT_CLINIC_OR_DEPARTMENT_OTHER): Payer: Medicare Other

## 2018-05-28 ENCOUNTER — Other Ambulatory Visit (HOSPITAL_COMMUNITY): Payer: Medicare Other

## 2018-05-28 DIAGNOSIS — F17210 Nicotine dependence, cigarettes, uncomplicated: Secondary | ICD-10-CM | POA: Diagnosis not present

## 2018-05-28 DIAGNOSIS — Z720 Tobacco use: Secondary | ICD-10-CM | POA: Diagnosis not present

## 2018-05-28 DIAGNOSIS — I4891 Unspecified atrial fibrillation: Secondary | ICD-10-CM | POA: Diagnosis not present

## 2018-05-28 DIAGNOSIS — J449 Chronic obstructive pulmonary disease, unspecified: Secondary | ICD-10-CM

## 2018-05-28 DIAGNOSIS — Z01810 Encounter for preprocedural cardiovascular examination: Secondary | ICD-10-CM | POA: Diagnosis not present

## 2018-05-28 DIAGNOSIS — I10 Essential (primary) hypertension: Secondary | ICD-10-CM | POA: Diagnosis not present

## 2018-05-28 DIAGNOSIS — R29703 NIHSS score 3: Secondary | ICD-10-CM | POA: Diagnosis not present

## 2018-05-28 DIAGNOSIS — I6339 Cerebral infarction due to thrombosis of other cerebral artery: Secondary | ICD-10-CM | POA: Diagnosis not present

## 2018-05-28 DIAGNOSIS — Z79899 Other long term (current) drug therapy: Secondary | ICD-10-CM | POA: Diagnosis not present

## 2018-05-28 DIAGNOSIS — J850 Gangrene and necrosis of lung: Secondary | ICD-10-CM | POA: Diagnosis not present

## 2018-05-28 DIAGNOSIS — I6521 Occlusion and stenosis of right carotid artery: Secondary | ICD-10-CM | POA: Diagnosis not present

## 2018-05-28 DIAGNOSIS — I63231 Cerebral infarction due to unspecified occlusion or stenosis of right carotid arteries: Secondary | ICD-10-CM | POA: Diagnosis not present

## 2018-05-28 DIAGNOSIS — I639 Cerebral infarction, unspecified: Secondary | ICD-10-CM | POA: Diagnosis present

## 2018-05-28 DIAGNOSIS — I6340 Cerebral infarction due to embolism of unspecified cerebral artery: Secondary | ICD-10-CM | POA: Diagnosis not present

## 2018-05-28 DIAGNOSIS — I712 Thoracic aortic aneurysm, without rupture: Secondary | ICD-10-CM | POA: Diagnosis present

## 2018-05-28 DIAGNOSIS — G8194 Hemiplegia, unspecified affecting left nondominant side: Secondary | ICD-10-CM | POA: Diagnosis not present

## 2018-05-28 DIAGNOSIS — I63233 Cerebral infarction due to unspecified occlusion or stenosis of bilateral carotid arteries: Secondary | ICD-10-CM | POA: Diagnosis not present

## 2018-05-28 DIAGNOSIS — I6523 Occlusion and stenosis of bilateral carotid arteries: Secondary | ICD-10-CM | POA: Diagnosis not present

## 2018-05-28 DIAGNOSIS — R9431 Abnormal electrocardiogram [ECG] [EKG]: Secondary | ICD-10-CM | POA: Diagnosis not present

## 2018-05-28 DIAGNOSIS — I2510 Atherosclerotic heart disease of native coronary artery without angina pectoris: Secondary | ICD-10-CM | POA: Diagnosis present

## 2018-05-28 DIAGNOSIS — I5030 Unspecified diastolic (congestive) heart failure: Secondary | ICD-10-CM | POA: Diagnosis not present

## 2018-05-28 DIAGNOSIS — J984 Other disorders of lung: Secondary | ICD-10-CM | POA: Diagnosis not present

## 2018-05-28 DIAGNOSIS — Z7951 Long term (current) use of inhaled steroids: Secondary | ICD-10-CM | POA: Diagnosis not present

## 2018-05-28 DIAGNOSIS — J439 Emphysema, unspecified: Secondary | ICD-10-CM | POA: Diagnosis not present

## 2018-05-28 DIAGNOSIS — F17200 Nicotine dependence, unspecified, uncomplicated: Secondary | ICD-10-CM | POA: Diagnosis not present

## 2018-05-28 DIAGNOSIS — R471 Dysarthria and anarthria: Secondary | ICD-10-CM | POA: Diagnosis not present

## 2018-05-28 DIAGNOSIS — G458 Other transient cerebral ischemic attacks and related syndromes: Secondary | ICD-10-CM | POA: Diagnosis not present

## 2018-05-28 DIAGNOSIS — E876 Hypokalemia: Secondary | ICD-10-CM | POA: Diagnosis not present

## 2018-05-28 LAB — CBC
HEMATOCRIT: 43.5 % (ref 39.0–52.0)
HEMOGLOBIN: 13.7 g/dL (ref 13.0–17.0)
MCH: 28.8 pg (ref 26.0–34.0)
MCHC: 31.5 g/dL (ref 30.0–36.0)
MCV: 91.6 fL (ref 80.0–100.0)
Platelets: 268 10*3/uL (ref 150–400)
RBC: 4.75 MIL/uL (ref 4.22–5.81)
RDW: 14.6 % (ref 11.5–15.5)
WBC: 8.5 10*3/uL (ref 4.0–10.5)
nRBC: 0 % (ref 0.0–0.2)

## 2018-05-28 LAB — BASIC METABOLIC PANEL
ANION GAP: 11 (ref 5–15)
BUN: 13 mg/dL (ref 8–23)
CHLORIDE: 103 mmol/L (ref 98–111)
CO2: 19 mmol/L — ABNORMAL LOW (ref 22–32)
CREATININE: 1.12 mg/dL (ref 0.61–1.24)
Calcium: 9.5 mg/dL (ref 8.9–10.3)
GFR calc non Af Amer: 59 mL/min — ABNORMAL LOW (ref >60)
GLUCOSE: 104 mg/dL — AB (ref 70–99)
Potassium: 3.3 mmol/L — ABNORMAL LOW (ref 3.5–5.1)
Sodium: 133 mmol/L — ABNORMAL LOW (ref 135–145)

## 2018-05-28 MED ORDER — MOMETASONE FURO-FORMOTEROL FUM 200-5 MCG/ACT IN AERO
2.0000 | INHALATION_SPRAY | Freq: Two times a day (BID) | RESPIRATORY_TRACT | Status: DC
  Administered 2018-05-28 – 2018-05-30 (×3): 2 via RESPIRATORY_TRACT
  Filled 2018-05-28: qty 8.8

## 2018-05-28 MED ORDER — CARVEDILOL 12.5 MG PO TABS
12.5000 mg | ORAL_TABLET | Freq: Two times a day (BID) | ORAL | Status: DC
  Administered 2018-05-28 – 2018-05-30 (×4): 12.5 mg via ORAL
  Filled 2018-05-28 (×4): qty 1

## 2018-05-28 MED ORDER — POTASSIUM CHLORIDE CRYS ER 20 MEQ PO TBCR
40.0000 meq | EXTENDED_RELEASE_TABLET | Freq: Once | ORAL | Status: AC
  Administered 2018-05-28: 40 meq via ORAL
  Filled 2018-05-28: qty 2

## 2018-05-28 NOTE — Evaluation (Signed)
Speech Language Pathology Evaluation Patient Details Name: [name] MRN: [id number] DOB: 1932/08/22 Today's Date: 05/28/2018 Time: 1130-1140 SLP Time Calculation (min) (ACUTE ONLY): 10 min  Problem List:  Patient Active Problem List   Diagnosis Date Noted  . Stroke (cerebrum) ([place]) 05/27/2018  . Hyponatremia 10/19/2014  . Chest pain 10/19/2014  . HTN (hypertension) 10/19/2014  . COPD (chronic obstructive pulmonary disease) ([place]) 10/19/2014   Past Medical History:  Past Medical History:  Diagnosis Date  . COPD (chronic obstructive pulmonary disease) ([place])   . Hypertension    Past Surgical History:  Past Surgical History:  Procedure Laterality Date  . BACK SURGERY    . COLONOSCOPY     polyps removed   HPI:  Patient is 84 years old male with past medical history of COPD, hypertension presented to the hospital with slurred speech and left-sided weakness.  MRI of the brain showed right lenticular nucleus infarct. Hx of COPD/  Per chart, he passed [facility] swallow screen at [facility], but subsequent report conflicts, hence SLP swallow eval was ordered per MD.    Assessment / Plan / Recommendation Clinical Impression  Pt presents with baseline cognitive-communicative function marked by adequate speech, fluent expression, normal receptive/expressive language, functional attention, recall, and awareness at baseline level.  No SLP f/u needed our service will sign off.     SLP Assessment  SLP Recommendation/Assessment: Patient does not need any further Speech Lanaguage Pathology Services SLP Visit Diagnosis: Dysphagia, unspecified (R13.10)    Follow Up Recommendations  None    Frequency and Duration           SLP Evaluation Cognition  Overall Cognitive Status: Within Functional Limits for tasks assessed Arousal/Alertness: Awake/alert Orientation Level: Oriented X4 Attention: Selective Selective Attention: Appears intact Memory: Appears intact       Comprehension  Reading  Comprehension Reading Status: Not tested    Expression Expression Primary Mode of Expression: Verbal Verbal Expression Overall Verbal Expression: Appears within functional limits for tasks assessed Written Expression Dominant Hand: Left   Oral / Motor  Oral Motor/Sensory Function Overall Oral Motor/Sensory Function: Within functional limits Motor Speech Overall Motor Speech: Appears within functional limits for tasks assessed   GO                    Juan Quam Laurice 05/28/2018, 11:57 AM  Estill Bamberg L. Tivis Ringer, [place] Office number [phone] Pager [phone]

## 2018-05-28 NOTE — Progress Notes (Signed)
  Echocardiogram 2D Echocardiogram has been performed.  Darlina Sicilian M 05/28/2018, 8:33 AM

## 2018-05-28 NOTE — Consult Note (Addendum)
Hospital Consult    Reason for Consult:  R CVA, R ICA stenosis Requesting Physician:  Dr. Eliseo Squires MRN #:  [id number]  History of Present Illness: This is a 84 y.o. male with past medical history significant for COPD, hypertension, and tobacco abuse presented to [facility] emergency department with left arm and leg weakness and facial droop.  Work-up included carotid duplex which demonstrated greater than 70% stenosis of right ICA.  MRI brain demonstrated acute infarct of right lenticular nucleus.  He was transferred to [facility] for further evaluation.  During exam today patient states left arm and left leg weakness has returned to nearly baseline.  He says he does not feel like he is slurring his words nor does he have facial droop.  He denies any further stroke symptoms including cha changes in vision.  He does not take any aspirin or statin.  He denies chest pain or shortness of breath with exertion.  He is a heavy tobacco user.  Past Medical History:  Diagnosis Date  . COPD (chronic obstructive pulmonary disease) ([place])   . Hypertension     Past Surgical History:  Procedure Laterality Date  . BACK SURGERY    . COLONOSCOPY     polyps removed    No Known Allergies  Prior to Admission medications   Medication Sig Start Date End Date Taking? Authorizing Provider  albuterol (PROVENTIL HFA;VENTOLIN HFA) 108 (90 BASE) MCG/ACT inhaler Inhale 2 puffs into the lungs every 6 (six) hours as needed for wheezing.   Yes [provider]  albuterol (PROVENTIL) (2.5 MG/3ML) 0.083% nebulizer solution Take 3 mLs (2.5 mg total) by nebulization every 4 (four) hours as needed for wheezing. 01/25/13  Yes Nat Christen, MD  amLODipine (NORVASC) 10 MG tablet Take 1 tablet by mouth daily. 04/19/18  Yes [provider]  carvedilol (COREG) 12.5 MG tablet Take 1 tablet by mouth 2 (two) times daily. 04/23/18  Yes [provider]  losartan (COZAAR) 100 MG tablet Take 1 tablet by mouth  daily. 04/19/18  Yes [provider]  SYMBICORT 160-4.5 MCG/ACT inhaler Inhale 2 puffs into the lungs 2 (two) times daily. 05/17/18  Yes [provider]  tamsulosin (FLOMAX) 0.4 MG CAPS capsule Take 1 capsule by mouth daily. 09/28/14  Yes [provider]  traMADol (ULTRAM) 50 MG tablet Take 50 mg by mouth 3 (three) times daily.   Yes [provider]  naproxen sodium (ANAPROX) 275 MG tablet Take 1 tablet (275 mg total) by mouth 2 (two) times daily with a meal. 03/06/16   Harris, Abigail, PA-C  valsartan (DIOVAN) 160 MG tablet Take 1 tablet (160 mg total) by mouth daily. Patient not taking: Reported on 05/27/2018 10/23/14   Rosita Fire, MD    Social History   Socioeconomic History  . Marital status: Widowed    Spouse name: Not on file  . Number of children: Not on file  . Years of education: Not on file  . Highest education level: Not on file  Occupational History  . Not on file  Social Needs  . Financial resource strain: Not on file  . Food insecurity:    Worry: Not on file    Inability: Not on file  . Transportation needs:    Medical: Not on file    Non-medical: Not on file  Tobacco Use  . Smoking status: Current Every Day Smoker    Packs/day: 1.00    Types: Cigarettes  . Smokeless tobacco: Never Used  Substance and Sexual Activity  . Alcohol use: No  . Drug use: No  . Sexual activity: Not on file  Lifestyle  . Physical activity:    Days per week: Not on file    Minutes per session: Not on file  . Stress: Not on file  Relationships  . Social connections:    Talks on phone: Not on file    Gets together: Not on file    Attends religious service: Not on file    Active member of club or organization: Not on file    Attends meetings of clubs or organizations: Not on file    Relationship status: Not on file  . Intimate partner violence:    Fear of current or ex partner: Not on file    Emotionally abused: Not on file    Physically abused:  Not on file    Forced sexual activity: Not on file  Other Topics Concern  . Not on file  Social History Narrative  . Not on file     Family History  Family history unknown: Yes    ROS: Otherwise negative unless mentioned in HPI  Physical Examination  Vitals:   05/28/18 0353 05/28/18 0805  BP: (!) 117/99 (!) 134/110  Pulse:  91  Resp:  16  Temp:  98.2 F (36.8 C)  SpO2:  95%   Body mass index is 17.85 kg/m.  General:  WDWN in NAD Gait: Not observed HENT: WNL, normocephalic Pulmonary: normal non-labored breathing, adventitious lung sounds all lung fields Cardiac: regular, without  Murmurs, rubs or gallops; with right neck bruit Abdomen:  soft, NT/ND, no masses Skin: without rashes Vascular Exam/Pulses: Palpable radial pulses left greater than right Extremities: without ischemic changes, without Gangrene , without cellulitis; without open wounds;  Musculoskeletal: no muscle wasting or atrophy  Neurologic: A&O X 3; symmetrical grip strength; CN grossly intact Psychiatric:  The pt has Normal affect. Lymph:  Unremarkable  CBC    Component Value Date/Time   WBC 8.5 05/28/2018 0415   RBC 4.75 05/28/2018 0415   HGB 13.7 05/28/2018 0415   HCT 43.5 05/28/2018 0415   PLT 268 05/28/2018 0415   MCV 91.6 05/28/2018 0415   MCH 28.8 05/28/2018 0415   MCHC 31.5 05/28/2018 0415   RDW 14.6 05/28/2018 0415   LYMPHSABS 1.2 05/26/2018 2240   MONOABS 0.6 05/26/2018 2240   EOSABS 0.1 05/26/2018 2240   BASOSABS 0.1 05/26/2018 2240    BMET    Component Value Date/Time   NA 133 (L) 05/28/2018 0415   K 3.3 (L) 05/28/2018 0415   CL 103 05/28/2018 0415   CO2 19 (L) 05/28/2018 0415   GLUCOSE 104 (H) 05/28/2018 0415   BUN 13 05/28/2018 0415   CREATININE 1.12 05/28/2018 0415   CALCIUM 9.5 05/28/2018 0415   GFRNONAA 59 (L) 05/28/2018 0415   GFRAA >60 05/28/2018 0415    COAGS: Lab Results  Component Value Date   INR 0.96 05/26/2018     Non-Invasive Vascular Imaging:      MR brain IMPRESSION: Acute infarct right lenticular nucleus.  Moderate atrophy and chronic microvascular ischemia.  Occluded right vertebral artery. Decreased signal in the posterior cerebral arteries bilaterally which may be due to proximal severe stenosis. Decreased signal in the middle cerebral arteries bilaterally likely due to atherosclerotic stenosis at the bifurcation bilaterally.  These results will be called to the ordering clinician or representative by the Radiologist Assistant, and communication documented in the PACS or zVision Dashboard.  Electronically Signed   By: Franchot Gallo M.D.   On: 05/27/2018 08:11  Carotid duplex IMPRESSION: Greater than 70% stenosis in the right internal carotid artery.  50-69% stenosis in the left internal carotid artery.  Right vertebral artery was nonvisualized.  Left vertebral artery is antegrade in flow.  There is extensive plaque throughout both common carotid arteries and bulbs as described above.   Electronically Signed   By: Marybelle Killings M.D.   On: 05/27/2018 08:57  Statin:  No. Beta Blocker:  Yes.   Aspirin:  No. ACEI:  No. ARB:  Yes.   CCB use:  Yes Other antiplatelets/anticoagulants:  No.    ASSESSMENT/PLAN: This is a 84 y.o. male with symptomatic right ICA stenosis  - Right hemispheric CVA secondary to right ICA stenosis; greater than 70% stenosis by carotid duplex - Left arm and leg weakness nearly baseline per patient - Agree with aspirin and statin - CTA head and neck ordered to further evaluate the stenosis of the right ICA - Treatment plan pending CTA head and neck and further recommendations from Dr. Trula Slade - If ok with the primary service and cleared by PT, OT, and speech therapy, patient can potentially be discharged home and return for outpatient revascularization of right ICA  Dagoberto Ligas PA-C [facility] [phone]   I agree with the above.  I seen  and evaluated patient.  He presented with left arm and leg weakness as well as facial droop.  MRI was consistent with acute right brain infarct.  He had a greater than 70% stenosis of the right internal carotid artery on Doppler ultrasound.  His symptoms have resolved.  I would like to confirm the ultrasound results with CT scan.  I will follow-up once this is been performed.  Estanislado Spire

## 2018-05-28 NOTE — Progress Notes (Signed)
Progress Note    HOLTON SIDMAN  XHB:[id number] DOB: March 12, 1933  DOA: 05/26/2018 PCP: Fran Lowes, MD    Brief Narrative:   Medical records reviewed and are as summarized below:  Brenan Modesto Zahner is an 84 y.o. male with past medical history of COPD, hypertension presented to the hospital with slurred speech and left-sided weakness.  Telemetry stroke was consulted.  CT scan did not show any acute hemorrhage but he was determined not to be a TPA candidate.  Patient was then admitted to hospital.  MRI of the brain showed right lenticular nucleus infarct.  Carotid duplex ultrasound showed a more than 70% stenosis in the right internal carotid artery, 50 to 69% stenosis on the left internal carotid artery and occlusion of the right vertebral artery.  Assessment/Plan:   Active Problems:   Stroke (cerebrum) (HCC)  Right middle cerebral artery territory infarct with right internal carotid artery more than 70% stenosis.  Left internal carotid artery 50 to 69% stenosis.  -LDL: 56 -HgbA1c: 5.2 -neuro consult pending -vascular consult: CTA ordered -tobacco abuse -tele-- ? A fib but more so appears to be sinus with PAC/PVCs-- patient denies h/o a fib -resume coreg (per chart review was given dig and cardizem at [facility] before transfer) -echo pending  Hypertension -resume coreg  COPD.   -resume home inhalers  Hypokalemia -replete  Tobacco abuse -encourage cessation  Family Communication/Anticipated D/C date and plan/Code Status   DVT prophylaxis: Lovenox ordered. Code Status: Full Code.  Family Communication: none at bedside-- sons went out to smoke Disposition Plan: home health?   Medical Consultants:    Neuro  vascular  Subjective:   "I feel better:  Objective:    Vitals:   05/28/18 0352 05/28/18 0353 05/28/18 0805 05/28/18 1250  BP: (!) 170/93 (!) 117/99 (!) 134/110 131/86  Pulse: 91  91 63  Resp: 16  16 18   Temp: 98.3 F (36.8 C)  98.2 F (36.8 C)     TempSrc: Oral  Oral   SpO2: 99%  95% 95%  Weight:      Height:        Intake/Output Summary (Last 24 hours) at 05/28/2018 1508 Last data filed at 05/28/2018 0353 Gross per 24 hour  Intake 0 ml  Output 550 ml  Net -550 ml   Filed Weights   05/26/18 2249  Weight: 58.1 kg    Exam: Sitting on side of bed, NAD No increased work of breathing, not moving much air Tachy-- feels regular +BS, soft  Data Reviewed:   I have personally reviewed following labs and imaging studies:  Labs: Labs show the following:   Basic Metabolic Panel: Recent Labs  Lab 05/26/18 2240 05/26/18 2246 05/27/18 2010 05/28/18 0415  NA 132* 134* 134* 133*  K 3.8 3.9 3.7 3.3*  CL 102 100 103 103  CO2 21*  --  19* 19*  GLUCOSE 136* 133* 116* 104*  BUN 21 21 14 13   CREATININE 1.24 1.20 1.15 1.12  CALCIUM 9.4  --  9.5 9.5  MG  --   --  2.0  --    GFR Estimated Creatinine Clearance: 41.1 mL/min (by C-G formula based on SCr of 1.12 mg/dL). Liver Function Tests: Recent Labs  Lab 05/26/18 2240 05/27/18 2010  AST 18 22  ALT 11 10  ALKPHOS 83 73  BILITOT 0.6 0.7  PROT 7.7 6.7  ALBUMIN 4.4 3.8   No results for input(s): LIPASE, AMYLASE in the last 168 hours. No  results for input(s): AMMONIA in the last 168 hours. Coagulation profile Recent Labs  Lab 05/26/18 2240  INR 0.96    CBC: Recent Labs  Lab 05/26/18 2240 05/26/18 2246 05/28/18 0415  WBC 10.3  --  8.5  NEUTROABS 8.2*  --   --   HGB 14.9 16.7 13.7  HCT 45.7 49.0 43.5  MCV 91.0  --  91.6  PLT 237  --  268   Cardiac Enzymes: No results for input(s): CKTOTAL, CKMB, CKMBINDEX, TROPONINI in the last 168 hours. BNP (last 3 results) No results for input(s): PROBNP in the last 8760 hours. CBG: Recent Labs  Lab 05/26/18 2242  GLUCAP 122*   D-Dimer: No results for input(s): DDIMER in the last 72 hours. Hgb A1c: Recent Labs    05/26/18 2240  HGBA1C 5.2   Lipid Profile: Recent Labs    05/27/18 0559  CHOL 129  HDL  60  LDLCALC 56  TRIG 63  CHOLHDL 2.2   Thyroid function studies: No results for input(s): TSH, T4TOTAL, T3FREE, THYROIDAB in the last 72 hours.  Invalid input(s): FREET3 Anemia work up: No results for input(s): VITAMINB12, FOLATE, FERRITIN, TIBC, IRON, RETICCTPCT in the last 72 hours. Sepsis Labs: Recent Labs  Lab 05/26/18 2240 05/28/18 0415  WBC 10.3 8.5    Microbiology No results found for this or any previous visit (from the past 240 hour(s)).  Procedures and diagnostic studies:  Dg Chest 2 View  Result Date: 05/27/2018 CLINICAL DATA:  Stroke-like symptoms EXAM: CHEST - 2 VIEW COMPARISON:  10/19/2014 FINDINGS: Linear atelectasis or scar in the right upper lobe. Hyperinflated lungs. No focal opacity or pleural effusion. Normal cardiomediastinal silhouette with aortic atherosclerosis. Emphysematous changes. Multiple mild lower thoracic compression deformities. IMPRESSION: Hyperinflation with emphysematous changes and atelectasis or scarring in the right upper lobe. Electronically Signed   By: Donavan Foil M.D.   On: 05/27/2018 00:49   Mr Brain Wo Contrast  Result Date: 05/27/2018 CLINICAL DATA:  Focal neuro deficit less than 6 hours. Suspect stroke. Left-sided weakness. EXAM: MRI HEAD WITHOUT CONTRAST MRA HEAD WITHOUT CONTRAST TECHNIQUE: Multiplanar, multiecho pulse sequences of the brain and surrounding structures were obtained without intravenous contrast. Angiographic images of the head were obtained using MRA technique without contrast. COMPARISON:  CT head 05/26/2018 FINDINGS: MRI HEAD FINDINGS Brain: Acute infarct right lenticular nucleus involving the body of the caudate and deep white matter tracts. No other acute infarct. Moderate atrophy. Chronic microvascular ischemic changes in the white matter. Negative for hemorrhage or mass. Vascular: Normal arterial flow voids Skull and upper cervical spine: Negative Sinuses/Orbits: Bilateral cataract surgery. Paranasal sinuses  clear. Other: None MRA HEAD FINDINGS Left vertebral artery widely patent and supplies the basilar. Distal right vertebral artery not visualized. PICA patent on the left. Superior cerebellar artery patent bilaterally. Posterior cerebral arteries have decreased signal distally which may be artifactual or due to proximal stenosis. Internal carotid artery patent bilaterally. Mild atherosclerotic stenosis in the cavernous carotid bilaterally. Anterior cerebral arteries patent bilaterally. Decreased signal in the middle cerebral artery branches bilaterally which may be due to severe M1 segment stenosis bilaterally versus artifact. IMPRESSION: Acute infarct right lenticular nucleus. Moderate atrophy and chronic microvascular ischemia. Occluded right vertebral artery. Decreased signal in the posterior cerebral arteries bilaterally which may be due to proximal severe stenosis. Decreased signal in the middle cerebral arteries bilaterally likely due to atherosclerotic stenosis at the bifurcation bilaterally. These results will be called to the ordering clinician or representative by the Radiologist Assistant,  and communication documented in the PACS or zVision Dashboard. Electronically Signed   By: Franchot Gallo M.D.   On: 05/27/2018 08:11   US Carotid Bilateral (at [facility] And [facility] Only)  Result Date: 05/27/2018 CLINICAL DATA:  Stroke EXAM: BILATERAL CAROTID DUPLEX ULTRASOUND TECHNIQUE: Pearline Cables scale imaging, color Doppler and duplex ultrasound were performed of bilateral carotid and vertebral arteries in the neck. COMPARISON:  None. FINDINGS: Criteria: Quantification of carotid stenosis is based on velocity parameters that correlate the residual internal carotid diameter with NASCET-based stenosis levels, using the diameter of the distal internal carotid lumen as the denominator for stenosis measurement. The following velocity measurements were obtained: RIGHT ICA: 410 cm/sec CCA: 75 cm/sec SYSTOLIC ICA/CCA RATIO:  5.5 ECA:  218 cm/sec LEFT ICA: 39 cm/sec CCA: 952 cm/sec SYSTOLIC ICA/CCA RATIO:  0.7 ECA: 153 cm/sec RIGHT CAROTID ARTERY: Focal calcified plaque in the lower common carotid artery. Scattered calcified plaque along the wall of the mid and upper common carotid artery. Advanced calcified plaque in the bulb with shadowing of the lumen. Low resistance internal carotid Doppler pattern is preserved. Calcified plaque extends into the lower internal carotid artery. RIGHT VERTEBRAL ARTERY:  Nonvisualized LEFT CAROTID ARTERY: There is extensive irregular calcified plaque in the lower common carotid artery. There is prominent smooth mixed plaque in the mid and upper common carotid artery. There is advanced calcified plaque in the bulb with shadowing of the lumen. LEFT VERTEBRAL ARTERY:  Antegrade. IMPRESSION: Greater than 70% stenosis in the right internal carotid artery. 50-69% stenosis in the left internal carotid artery. Right vertebral artery was nonvisualized. Left vertebral artery is antegrade in flow. There is extensive plaque throughout both common carotid arteries and bulbs as described above. Electronically Signed   By: Marybelle Killings M.D.   On: 05/27/2018 08:57   Mr Jodene Nam Head/brain [facility] Cm  Result Date: 05/27/2018 CLINICAL DATA:  Focal neuro deficit less than 6 hours. Suspect stroke. Left-sided weakness. EXAM: MRI HEAD WITHOUT CONTRAST MRA HEAD WITHOUT CONTRAST TECHNIQUE: Multiplanar, multiecho pulse sequences of the brain and surrounding structures were obtained without intravenous contrast. Angiographic images of the head were obtained using MRA technique without contrast. COMPARISON:  CT head 05/26/2018 FINDINGS: MRI HEAD FINDINGS Brain: Acute infarct right lenticular nucleus involving the body of the caudate and deep white matter tracts. No other acute infarct. Moderate atrophy. Chronic microvascular ischemic changes in the white matter. Negative for hemorrhage or mass. Vascular: Normal arterial flow voids Skull and  upper cervical spine: Negative Sinuses/Orbits: Bilateral cataract surgery. Paranasal sinuses clear. Other: None MRA HEAD FINDINGS Left vertebral artery widely patent and supplies the basilar. Distal right vertebral artery not visualized. PICA patent on the left. Superior cerebellar artery patent bilaterally. Posterior cerebral arteries have decreased signal distally which may be artifactual or due to proximal stenosis. Internal carotid artery patent bilaterally. Mild atherosclerotic stenosis in the cavernous carotid bilaterally. Anterior cerebral arteries patent bilaterally. Decreased signal in the middle cerebral artery branches bilaterally which may be due to severe M1 segment stenosis bilaterally versus artifact. IMPRESSION: Acute infarct right lenticular nucleus. Moderate atrophy and chronic microvascular ischemia. Occluded right vertebral artery. Decreased signal in the posterior cerebral arteries bilaterally which may be due to proximal severe stenosis. Decreased signal in the middle cerebral arteries bilaterally likely due to atherosclerotic stenosis at the bifurcation bilaterally. These results will be called to the ordering clinician or representative by the Radiologist Assistant, and communication documented in the PACS or zVision Dashboard. Electronically Signed   By: Franchot Gallo  M.D.   On: 05/27/2018 08:11   Ct Head Code Stroke Wo Contrast  Result Date: 05/26/2018 CLINICAL DATA:  Code stroke.  Left leg weakness EXAM: CT HEAD WITHOUT CONTRAST TECHNIQUE: Contiguous axial images were obtained from the base of the skull through the vertex without intravenous contrast. COMPARISON:  None. FINDINGS: Brain: There is no mass, hemorrhage or extra-axial collection. There is generalized atrophy without lobar predilection. There is no acute or chronic infarction. There is hypoattenuation of the periventricular white matter, most commonly indicating chronic ischemic microangiopathy. Vascular: No abnormal  hyperdensity of the major intracranial arteries or dural venous sinuses. No intracranial atherosclerosis. Skull: The visualized skull base, calvarium and extracranial soft tissues are normal. Sinuses/Orbits: No fluid levels or advanced mucosal thickening of the visualized paranasal sinuses. No mastoid or middle ear effusion. The orbits are normal. ASPECTS [facility] Stroke Program Early CT Score) - Ganglionic level infarction (caudate, lentiform nuclei, internal capsule, insula, M1-M3 cortex): 7 - Supraganglionic infarction (M4-M6 cortex): 3 Total score (0-10 with 10 being normal): 10 IMPRESSION: 1. No intracranial hemorrhage. 2. ASPECTS is 10. These results were called by telephone at the time of interpretation on 05/26/2018 at 10:49 pm to Dr. Nat Christen , who verbally acknowledged these results. Electronically Signed   By: Ulyses Jarred M.D.   On: 05/26/2018 22:50    Medications:   .  stroke: mapping our early stages of recovery book   Does not apply Once  . aspirin  300 mg Rectal Daily   Or  . aspirin  325 mg Oral Daily  . carvedilol  12.5 mg Oral BID  . enoxaparin (LOVENOX) injection  40 mg Subcutaneous Q24H  . mometasone-formoterol  2 puff Inhalation BID   Continuous Infusions: . sodium chloride 50 mL/hr at 05/27/18 1511     LOS: 0 days   Geradine Girt  [facility]   *Please refer to [email or web address], password [email or web address] to get updated schedule on who will round on this patient, as hospitalists switch teams weekly. If 7PM-7AM, please contact night-coverage at [email or web address], password [email or web address] for any overnight needs.  05/28/2018, 3:08 PM

## 2018-05-28 NOTE — Progress Notes (Signed)
Report given to Maggie Schwalbe at [place] , transported to [place] in stable condition .

## 2018-05-28 NOTE — Evaluation (Signed)
Clinical/Bedside Swallow Evaluation Patient Details  Name: [name] MRN: [id number] Date of Birth: [birth year]  Today's Date: 05/28/2018 Time: SLP Start Time (ACUTE ONLY): 1120 SLP Stop Time (ACUTE ONLY): 1130 SLP Time Calculation (min) (ACUTE ONLY): 10 min  Past Medical History:  Past Medical History:  Diagnosis Date  . COPD (chronic obstructive pulmonary disease) ([place])   . Hypertension    Past Surgical History:  Past Surgical History:  Procedure Laterality Date  . BACK SURGERY    . COLONOSCOPY     polyps removed   HPI:  Patient is 84 years old male with past medical history of COPD, hypertension presented to the hospital with slurred speech and left-sided weakness.  MRI of the brain showed right lenticular nucleus infarct. Hx of COPD/  Per chart, he passed Yale swallow screen at [facility], but subsequent report conflicts, hence SLP swallow eval was ordered per MD.    Assessment / Plan / Recommendation Clinical Impression  Pt presents with normal oropharyngeal swallow function with adequate mastication, brisk swallow response, no s/s of aspiration.  No dysphagia. Resume regular diet, thin liquids.  SLP to sign off.  SLP Visit Diagnosis: Dysphagia, unspecified (R13.10)    Aspiration Risk  No limitations    Diet Recommendation   regular solids, thin liquids  Medication Administration: Whole meds with liquid    Other  Recommendations     Follow up Recommendations None      Frequency and Duration            Prognosis        Swallow Study   General Date of Onset: 05/26/18 HPI: Patient is 84 years old male with past medical history of COPD, hypertension presented to the hospital with slurred speech and left-sided weakness.  MRI of the brain showed right lenticular nucleus infarct. Hx of COPD/  Per chart, he passed [facility] swallow screen at [facility], but subsequent report conflicts, hence SLP swallow eval was ordered per MD.  Type of Study: Bedside Swallow Evaluation Diet Prior  to this Study: NPO Temperature Spikes Noted: No Respiratory Status: Room air History of Recent Intubation: No Behavior/Cognition: Alert;Cooperative Oral Cavity Assessment: Within Functional Limits Oral Care Completed by SLP: No Oral Cavity - Dentition: Edentulous Vision: Functional for self-feeding Self-Feeding Abilities: Able to feed self Patient Positioning: Upright in bed Baseline Vocal Quality: Normal Volitional Cough: Strong Volitional Swallow: Able to elicit    Oral/Motor/Sensory Function Overall Oral Motor/Sensory Function: Within functional limits   Ice Chips Ice chips: Within functional limits   Thin Liquid Thin Liquid: Within functional limits    Nectar Thick Nectar Thick Liquid: Not tested   Honey Thick Honey Thick Liquid: Not tested   Puree Puree: Within functional limits   Solid     Solid: Within functional limits      Juan Quam Laurice 05/28/2018,11:54 AM

## 2018-05-28 NOTE — Progress Notes (Signed)
Physical Therapy Treatment Patient Details Name: [name] MRN: [id number] DOB: [birth year] Today's Date: 05/28/2018    History of Present Illness Patient is 84 years old male with past medical history of COPD, hypertension presented to the hospital with slurred speech and left-sided weakness.  MRI of the brain showed right lenticular nucleus infarct. Hx of COPD.    PT Comments    Pt progressing well with mobility. He demonstrated modified independence with bed mobility. Supervision/min guard assist provided with transfers and ambulation 250 feet with RW. Pt able to ambulate in room without AD min guard assist. Balance deficits noted with mild unsteadiness during mobility. No overt LOB. D/C recommendations updated to HHPT. Pt reports his home set up is accessible with RW.     Follow Up Recommendations  Home health PT;Supervision - Intermittent     Equipment Recommendations  Rolling walker with 5" wheels    Recommendations for Other Services       Precautions / Restrictions Precautions Precautions: Fall    Mobility  Bed Mobility Overal bed mobility: Modified Independent       Supine to sit: Modified independent (Device/Increase time)     General bed mobility comments: +rail  Transfers Overall transfer level: Needs assistance Equipment used: Ambulation equipment used Transfers: Sit to/from [facility] Sit to Stand: Supervision Stand pivot transfers: Min guard       General transfer comment: min guard for safety  Ambulation/Gait Ambulation/Gait assistance: Min guard Gait Distance (Feet): 250 Feet Assistive device: Rolling walker (2 wheeled) Gait Pattern/deviations: Step-through pattern;Decreased stride length;Trunk flexed Gait velocity: decreased Gait velocity interpretation: 1.31 - 2.62 ft/sec, indicative of limited community ambulator General Gait Details: Ambulated in hallway with RW min guard assist. Ambulated in room without AD min guard  assist. Mild unsteadiness noted. No overt LOB.    Stairs             Wheelchair Mobility    Modified Rankin (Stroke Patients Only) Modified Rankin (Stroke Patients Only) Pre-Morbid Rankin Score: No significant disability Modified Rankin: Moderate disability     Balance Overall balance assessment: Needs assistance Sitting-balance support: Feet supported;No upper extremity supported Sitting balance-Leahy Scale: Good     Standing balance support: During functional activity;No upper extremity supported Standing balance-Leahy Scale: Fair Standing balance comment: able to complete ADL at sink wihtout external support but unsteady. Furniture walking in room                            Cognition Arousal/Alertness: Awake/alert Behavior During Therapy: WFL for tasks assessed/performed Overall Cognitive Status: Within Functional Limits for tasks assessed                                        Exercises      General Comments        Pertinent Vitals/Pain Pain Assessment: No/denies pain    Home Living Family/patient expects to be discharged to:: Private residence Living Arrangements: Children Available Help at Discharge: Family;Available PRN/intermittently Type of Home: House Home Access: Ramped entrance   Home Layout: One level Home Equipment: Cane - single point      Prior Function Level of Independence: Independent      Comments: community ambulator, drives   PT Goals (current goals can now be found in the care plan section) Acute Rehab PT Goals Patient Stated Goal: to go  home PT Goal Formulation: With patient/family Time For Goal Achievement: 06/10/18 Potential to Achieve Goals: Good Progress towards PT goals: Progressing toward goals    Frequency    Min 4X/week      PT Plan Discharge plan needs to be updated;Frequency needs to be updated    Co-evaluation              AM-PAC PT "6 Clicks" Daily Activity   Outcome Measure  Difficulty turning over in bed (including adjusting bedclothes, sheets and blankets)?: None Difficulty moving from lying on back to sitting on the side of the bed? : A Little Difficulty sitting down on and standing up from a chair with arms (e.g., wheelchair, bedside commode, etc,.)?: A Little Help needed moving to and from a bed to chair (including a wheelchair)?: None Help needed walking in hospital room?: A Little Help needed climbing 3-5 steps with a railing? : A Little 6 Click Score: 20    End of Session Equipment Utilized During Treatment: Gait belt Activity Tolerance: Patient tolerated treatment well Patient left: in bed;with call bell/phone within reach Nurse Communication: Mobility status PT Visit Diagnosis: Unsteadiness on feet (R26.81);Other abnormalities of gait and mobility (R26.89);Muscle weakness (generalized) (M62.81)     Time: 7737-3668 PT Time Calculation (min) (ACUTE ONLY): 20 min  Charges:  $Gait Training: 8-22 mins                     Lorrin Goodell, PT  Office # [phone] Pager [phone]    Lorriane Shire 05/28/2018, 10:15 AM

## 2018-05-28 NOTE — Progress Notes (Signed)
Assumed care on pt. , admitting MD notified to transfer pt. to a telemetry floor per case manager's directive . Patient is alert and oriented , respirations unlabored , IV sites intact , denies pain , plan of care explained to pt. and family .

## 2018-05-28 NOTE — Consult Note (Addendum)
NEURO HOSPITALIST  CONSULT     Requesting Physician: Dr. Eliseo Squires    Chief Complaint: Slurred speech with left arm and leg weakness  History obtained from:  Patient    HPI:                                                                                                                                         [name] is an 84 y.o. male  With [place] significant for COPD and HTN who presented initially to the [facility] ED on 05/26/18 with slurred speech, left arm and leg weakness and facial drooping. Patient was transferred to [facility] for further stroke work-up after MRI brain showed a right periventricular acute ischemic infarction.   Patient states that on 05/26/18 about 1700 he was out in the yard when he had a sudden onset of " left leg went limp". Denies any CP, SOB, dizziness, visual problems, N/V, HA, No prior stroke history noted. Denies ETOH and drug use. Endorses > 10 year smoking history. Smokes 1/2 pack per day. Denies any blood thinners or daily ASA use.   Hospital course: Patient saw tele- neurology at [facility]. initial NIHSS was 3. No TPA given b/c he was not within the window. Not an IR candidate d/t no LVO suspected. CT head: no acute hemorrhage. MRI/MRA: Acute infarct right periventricular white matter overlapping the body of the right caudate nucleus. Occluded right vertebral artery BG: 104 BP: 134/110 Hgb A1c 05/29/18: 5.2 LDL:  56   Date last known well: Date: 05/26/2018 Time last known well: Time: 17:00 tPA Given: No: outside of window  Modified Rankin: Rankin Score=1 NIHSS: 3  1a Level of Conscious:0 1b LOC Questions: 0 1c LOC Commands: 0 2 Best Gaze: 0 3 Visual: 0 4 Facial Palsy: 1 5a Motor Arm - left: 0 5b Motor Arm - Right: 1 6a Motor Leg - Left: 0 6b Motor Leg - Right:0 7 Limb Ataxia: 0 8 Sensory: 0 9 Best Language:0  10 Dysarthria:1 11 Extinct. and Inattention:0    Past Medical History:  Diagnosis  Date  . COPD (chronic obstructive pulmonary disease) ([place])   . Hypertension     Past Surgical History:  Procedure Laterality Date  . BACK SURGERY    . COLONOSCOPY     polyps removed    Family History  Family history unknown: Yes     Social History:  reports that he has been smoking cigarettes. He has been smoking about 1.00 pack per day. He has never used smokeless tobacco. He reports that he does not drink alcohol or use drugs.  Allergies: No Known Allergies  Medications:                                                                                                                           Scheduled: .  stroke: mapping our early stages of recovery book   Does not apply Once  . aspirin  300 mg Rectal Daily   Or  . aspirin  325 mg Oral Daily  . enoxaparin (LOVENOX) injection  40 mg Subcutaneous Q24H   Continuous: . sodium chloride 50 mL/hr at 05/27/18 1511   RJJ:OACZYSAYT, hydrALAZINE, senna-docusate   ROS:                                                                                                                                       ROS was performed and is negative except as noted in HPI  General Examination:                                                                                                      Blood pressure (!) 134/110, pulse 91, temperature 98.2 F (36.8 C), temperature source Oral, resp. rate 16, height 5\' 11"  (1.803 m), weight 58.1 kg, SpO2 95 %.  HEENT-  Normocephalic, no lesions, without obvious abnormality.  Normal external eye and conjunctiva.  Cardiovascular- S1-S2 audible, pulses palpable throughout   Lungs-no rhonchi or wheezing noted, no excessive working breathing.  Saturations within normal limits on RA Abdomen- All 4 quadrants palpated and nontender Extremities- Warm, dry and intact Musculoskeletal-no joint tenderness, deformity or swelling, right arm ROM limited d/t arthritis. Skin-warm and dry, no hyperpigmentation, vitiligo, or  suspicious lesions  Neurological Examination Mental Status: Alert, oriented, thought content appropriate. Naming intact, Speech fluent without evidence of aphasia.  Dysarthria noted. Able to follow  commands without difficulty. Cranial Nerves: II:  Visual fields grossly normal,  III,IV, VI: ptosis not present, extra-ocular motions intact bilaterally, pupils equal, round, reactive to light and accommodation V,VII: smile asymmetric, slight left facial droop,  facial light touch sensation normal bilaterally VIII: hearing normal bilaterally IX,X: uvula rises symmetrically XI: bilateral shoulder shrug XII: midline tongue extension Motor: Right : Upper extremity   5/5  Left:     Upper extremity   5/5  Lower extremity   5/5   Lower extremity   5/5 Tone and bulk:normal tone throughout; no atrophy noted Sensory: cool temp and  light touch intact throughout, bilaterally Deep Tendon Reflexes: 2+ and symmetric biceps and patella Plantars: Right: downgoing   Left: downgoing Cerebellar: normal finger-to-nose, normal rapid alternating movements , HTS Gait: deferred   Lab Results: Basic Metabolic Panel: Recent Labs  Lab 05/26/18 2240 05/26/18 2246 05/27/18 2010 05/28/18 0415  NA 132* 134* 134* 133*  K 3.8 3.9 3.7 3.3*  CL 102 100 103 103  CO2 21*  --  19* 19*  GLUCOSE 136* 133* 116* 104*  BUN 21 21 14 13   CREATININE 1.24 1.20 1.15 1.12  CALCIUM 9.4  --  9.5 9.5  MG  --   --  2.0  --     CBC: Recent Labs  Lab 05/26/18 2240 05/26/18 2246 05/28/18 0415  WBC 10.3  --  8.5  NEUTROABS 8.2*  --   --   HGB 14.9 16.7 13.7  HCT 45.7 49.0 43.5  MCV 91.0  --  91.6  PLT 237  --  268    Lipid Panel: Recent Labs  Lab 05/27/18 0559  CHOL 129  TRIG 63  HDL 60  CHOLHDL 2.2  VLDL 13  LDLCALC 56    CBG: Recent Labs  Lab 05/26/18 2242  GLUCAP 122*    Imaging: Dg Chest 2 View Result Date: 05/27/2018 . IMPRESSION: Hyperinflation with emphysematous changes and atelectasis or  scarring in the right upper lobe.   Mr Brain Wo Contrast/MRA head Result Date: 05/27/2018 . IMPRESSION: Acute infarct right lenticular nucleus. Moderate atrophy and chronic microvascular ischemia. Occluded right vertebral artery. Decreased signal in the posterior cerebral arteries bilaterally which may be due to proximal severe stenosis. Decreased signal in the middle cerebral arteries bilaterally likely due to atherosclerotic stenosis at the bifurcation bilaterally.  US Carotid Bilateral (at [facility] And [facility] Only) Result Date: 05/27/2018 . IMPRESSION: Greater than 70% stenosis in the right internal carotid artery. 50-69% stenosis in the left internal carotid artery. Right vertebral artery was nonvisualized. Left vertebral artery is antegrade in flow. There is extensive plaque throughout both common carotid arteries and bulbs as described above.   Ct Head Code Stroke Wo Contrast Result Date: 05/26/2018  IMPRESSION: 1. No intracranial hemorrhage.  2. ASPECTS is 10. Sheria Lang, MSN, NP-C [facility] [phone] 05/28/2018, 9:59 AM    Assessment: 84 y.o. male with [place] significant for COPD and HTN who initially presented to the [facility] ED on 05/26/18 with slurred speech, left arm and leg weakness and facial droop. Patient transferred to [facility] for further stroke work-up.  1. CT head: no hemorrhage.  2. MRI/MRA: Acute infarct right periventricular white matter overlapping the body of the right caudate nucleus. Occluded right vertebral artery and decreased vascular signal in anterior and posterior signal appearing most consistent with intracranial atherosclerotic disease.  3. ECHO: Systolic function was mildly to   moderately reduced. The estimated ejection fraction was in the   range of 40% to 45%. Diffuse hypokinesis. No mural thrombus mentioned in the report.  4. Questionable a-fib based upon EKG today.    5. Complete stroke- work-up pending.  6. Stroke  Risk Factors -  hypertension 7. Etiology: Felt most likely to be secondary to intravascular thrombosis due to evidence for intracranial atherosclerotic disease on MRA 8. Carotid ultrasound: Greater than 70% stenosis in the right internal carotid artery. 50-69% stenosis in the left internal carotid artery. Right vertebral artery was nonvisualized. Left vertebral artery is antegrade in flow. There is extensive plaque throughout both common carotid arteries and bulbs. 9. Patient not taking an antiplatelet medication or an anticoagulant at home   Recommendations: -- ASA  -- HgbA1c, fasting lipid panel (completed) -- PT consult, OT consult, Speech consult --Telemetry monitoring to verify or rule out atrial fibrillation --Frequent neuro checks --Smoking cessation --Stroke swallow screen  --Due to advanced age, the benefits of statin therapy may outweigh benefits. However, he does have what appears to be severe intracranial atherosclerotic disease. Will defer to stroke team regarding final decision on statin.  --BP management as per standard internal medicine protocol. Out of permissive HTN time window --Smoking cessation to reduce risk of recurrent stroke --Please page stroke NP  Or  PA  Or MD from 8am -4 pm  as this patient from this time will be  followed by the stroke.   You can look them up on [email or web address]  Password [email or web address]  I have seen and examined the patient. I have amended the documented neurological examination and formulated the assessment and recommendations.  Electronically signed: Dr. Kerney Elbe

## 2018-05-28 NOTE — Progress Notes (Signed)
Occupational Therapy Evaluation Patient Details Name: [name] MRN: [id number] DOB: 1933-08-04 Today's Date: 05/28/2018    History of Present Illness Patient is 84 years old male with past medical history of COPD, hypertension presented to the hospital with slurred speech and left-sided weakness.  MRI of the brain showed right lenticular nucleus infarct. Hx of COPD.   Clinical Impression   PTA, pt lived with his son and was independent with ADL, IADL and mobility. Pt drives and is active. Pt presents with mild coordination deficits LUE amd mild unsteadiness. Feel pt would benefit form HHOT to facilitate return to PLOF with mobility, ADL and IADL tasks. Pt will need a 3 in1 for safe DC home. All further OT to be addressed by [place].   Completed education regarding warning signs/symptoms of CVA using [employer].   Follow Up Recommendations  Home health OT;Supervision - Intermittent    Equipment Recommendations  3 in 1 bedside commode;Other (comment)(RW)    Recommendations for Other Services       Precautions / Restrictions Precautions Precautions: Fall      Mobility Bed Mobility Overal bed mobility: Modified Independent                Transfers Overall transfer level: Needs assistance   Transfers: Sit to/from Stand;Stand Pivot Transfers Sit to Stand: Supervision Stand pivot transfers: Min guard            Balance Overall balance assessment: Needs assistance   Sitting balance-Leahy Scale: Good       Standing balance-Leahy Scale: Fair Standing balance comment: able to complete ADL at sink without external support but mildly unsteady. Furniture walking in room. Using grab bar for shower transfer                           ADL either performed or assessed with clinical judgement   ADL Overall ADL's : Needs assistance/impaired                                     Functional mobility during ADLs: Min guard General ADL Comments: Overall  set up/supervision for ADL tasks; recommend S with IADL tasks  2/4 dyspnea with ADL which pt states is normal for him. Educated on recommendation for use of 3in1 for shower chair to conserve energy and reduce risk of falls. Pt verbalized understanding.      Vision Baseline Vision/History: Wears glasses Wears Glasses: Reading only Vision Assessment?: Yes;No apparent visual deficits     Agricultural engineer Tested?: (WFL)   Praxis Praxis Praxis tested?: Within functional limits    Pertinent Vitals/Pain Pain Assessment: No/denies pain     Hand Dominance Left   Extremity/Trunk Assessment Upper Extremity Assessment Upper Extremity Assessment: LUE deficits/detail LUE Deficits / Details: Mild LUE coordiniation deficits but functional LUE Coordination: decreased fine motor  R shoulder loss of ROM at baseline. Apparent RTC insufficiency.    Lower Extremity Assessment Lower Extremity Assessment: Defer to PT evaluation   Cervical / Trunk Assessment Cervical / Trunk Assessment: Kyphotic   Communication Communication Communication: No difficulties   Cognition Arousal/Alertness: Awake/alert Behavior During Therapy: WFL for tasks assessed/performed Overall Cognitive Status: Within Functional Limits for tasks assessed  General Comments       Exercises     Shoulder Instructions      Home Living Family/patient expects to be discharged to:: Private residence Living Arrangements: Children Available Help at Discharge: Family;Available PRN/intermittently Type of Home: House Home Access: Ramped entrance     Home Layout: One level     Bathroom Shower/Tub: Occupational psychologist: Handicapped height Bathroom Accessibility: Yes How Accessible: Accessible via walker Home Equipment: [place] - single point          Prior Functioning/Environment Level of Independence: Independent        Comments:  community ambulator, drives        OT Problem List: Decreased strength;Decreased activity tolerance;Impaired balance (sitting and/or standing);Decreased coordination;Decreased knowledge of use of DME or AE      OT Treatment/Interventions:      OT Goals(Current goals can be found in the care plan section) Acute Rehab OT Goals Patient Stated Goal: to go home OT Goal Formulation: With patient  OT Frequency:     Barriers to D/C:            Co-evaluation              AM-PAC PT "6 Clicks" Daily Activity     Outcome Measure Help from another person eating meals?: None Help from another person taking care of personal grooming?: None Help from another person toileting, which includes using toliet, bedpan, or urinal?: None Help from another person bathing (including washing, rinsing, drying)?: A Little Help from another person to put on and taking off regular upper body clothing?: None Help from another person to put on and taking off regular lower body clothing?: A Little 6 Click Score: 22   End of Session Equipment Utilized During Treatment: Gait belt Nurse Communication: Mobility status  Activity Tolerance: Patient tolerated treatment well Patient left: in bed;with call bell/phone within reach  OT Visit Diagnosis: Unsteadiness on feet (R26.81);Muscle weakness (generalized) (M62.81)                Time: 5945-8592 OT Time Calculation (min): 20 min Charges:  OT General Charges $OT Visit: 1 Visit OT Evaluation $OT Eval Low Complexity: [place], OT/L   Acute OT Clinical Specialist Acute Rehabilitation Services Pager [phone] Office [phone]   [facility] 05/28/2018, 10:07 AM

## 2018-05-28 NOTE — Care Management Note (Signed)
Case Management Note  Patient Details  Name: [name] MRN: [id number] Date of Birth: 07/26/1933  Subjective/Objective: 84 yo male transfer from [facility] for slurred speech and left sided wekness; MRI showed acute infarct of right lenticular nucleus.                    Action/Plan: CM met with patient and his two sons to discuss transitional needs and PT/OT recommendations. Patient lived at home with his son, independent with ADLs with no DME in use PTA. PCP verified as: Dr. Harriett Sine, [place]; Pharmacy of choice: [facility]. PT/OT eval completed with HHPT/OT recommended and a RW/3n1 BSC. HH/DME preference, with [facility] and [facility] selected. HH referral given to Adela Lank, [facility] liaison; DME referral given to Jeneen Rinks [facility] liaison with AVS updated. Patient's son will provide assistance post transition and transportation home. No further needs from CM.    Expected Discharge Date:                  Expected Discharge Plan:  [place]  In-House Referral:  NA  Discharge planning Services  CM Consult  Post Acute Care Choice:  Durable Medical Equipment, Home Health Choice offered to:  Patient, Adult Children  DME Arranged:  Bedside commode, Walker rolling DME Agency:  [place] Arranged:  PT, OT [facility] Agency:  [facility]  Status of Service:  Completed, signed off  If discussed at [place] of Stay Meetings, dates discussed:    Additional Comments:  Midge Minium RN, BSN, NCM-BC, ACM-RN [phone] 05/28/2018, 12:45 PM

## 2018-05-29 ENCOUNTER — Inpatient Hospital Stay (HOSPITAL_COMMUNITY): Payer: Medicare Other

## 2018-05-29 ENCOUNTER — Encounter (HOSPITAL_COMMUNITY): Payer: Self-pay | Admitting: Radiology

## 2018-05-29 DIAGNOSIS — I63231 Cerebral infarction due to unspecified occlusion or stenosis of right carotid arteries: Secondary | ICD-10-CM

## 2018-05-29 DIAGNOSIS — I6523 Occlusion and stenosis of bilateral carotid arteries: Secondary | ICD-10-CM

## 2018-05-29 DIAGNOSIS — G458 Other transient cerebral ischemic attacks and related syndromes: Secondary | ICD-10-CM

## 2018-05-29 DIAGNOSIS — F17200 Nicotine dependence, unspecified, uncomplicated: Secondary | ICD-10-CM

## 2018-05-29 DIAGNOSIS — J984 Other disorders of lung: Secondary | ICD-10-CM

## 2018-05-29 DIAGNOSIS — I10 Essential (primary) hypertension: Secondary | ICD-10-CM

## 2018-05-29 LAB — BASIC METABOLIC PANEL
Anion gap: 9 (ref 5–15)
BUN: 19 mg/dL (ref 8–23)
CALCIUM: 9.1 mg/dL (ref 8.9–10.3)
CO2: 23 mmol/L (ref 22–32)
CREATININE: 1.26 mg/dL — AB (ref 0.61–1.24)
Chloride: 102 mmol/L (ref 98–111)
GFR calc Af Amer: 59 mL/min — ABNORMAL LOW (ref >60)
GFR calc non Af Amer: 51 mL/min — ABNORMAL LOW (ref >60)
Glucose, Bld: 101 mg/dL — ABNORMAL HIGH (ref 70–99)
Potassium: 3.7 mmol/L (ref 3.5–5.1)
SODIUM: 134 mmol/L — AB (ref 135–145)

## 2018-05-29 MED ORDER — ATORVASTATIN CALCIUM 40 MG PO TABS
40.0000 mg | ORAL_TABLET | Freq: Every day | ORAL | Status: DC
  Administered 2018-05-29 – 2018-05-30 (×2): 40 mg via ORAL
  Filled 2018-05-29 (×2): qty 1

## 2018-05-29 MED ORDER — IOPAMIDOL (ISOVUE-370) INJECTION 76%
50.0000 mL | Freq: Once | INTRAVENOUS | Status: AC | PRN
  Administered 2018-05-29: 50 mL via INTRAVENOUS

## 2018-05-29 MED ORDER — IOPAMIDOL (ISOVUE-370) INJECTION 76%
INTRAVENOUS | Status: AC
  Filled 2018-05-29: qty 50

## 2018-05-29 MED ORDER — IOPAMIDOL (ISOVUE-300) INJECTION 61%
INTRAVENOUS | Status: AC
  Filled 2018-05-29: qty 50

## 2018-05-29 NOTE — Progress Notes (Signed)
STROKE TEAM PROGRESS NOTE   SUBJECTIVE (INTERVAL HISTORY) His son is at the bedside.  Patient sitting in bed and stated that he feels much better and back to normal.  He walked with me in the room and no dragging of legs.  Had a CTA head and neck showed right ICA more than 70% stenosis, but also had right likely subclavian steal from [place], and right lung mass lesion.  Plan to do CTA chest for further evaluation.  VVS also saw the patient plan for CEA next Wednesday after cardiology clearance.  Patient is willing to quit smoking.    OBJECTIVE Vitals:   05/28/18 2048 05/28/18 2321 05/29/18 0326 05/29/18 0812  BP: (!) 191/115 (!) 162/102 (!) 156/98 (!) 191/88  Pulse: 87 (!) 55 85 71  Resp: 18 18 18 18   Temp: 98.1 F (36.7 C) 98 F (36.7 C) 97.9 F (36.6 C) 98.3 F (36.8 C)  TempSrc: Oral Oral Oral Oral  SpO2: 98% 97% 97% 97%  Weight: 56.9 kg     Height: 5\' 11"  (1.803 m)       CBC:  Recent Labs  Lab 05/26/18 2240 05/26/18 2246 05/28/18 0415  WBC 10.3  --  8.5  NEUTROABS 8.2*  --   --   HGB 14.9 16.7 13.7  HCT 45.7 49.0 43.5  MCV 91.0  --  91.6  PLT 237  --  440    Basic Metabolic Panel:  Recent Labs  Lab 05/27/18 2010 05/28/18 0415  NA 134* 133*  K 3.7 3.3*  CL 103 103  CO2 19* 19*  GLUCOSE 116* 104*  BUN 14 13  CREATININE 1.15 1.12  CALCIUM 9.5 9.5  MG 2.0  --     Lipid Panel:     Component Value Date/Time   CHOL 129 05/27/2018 0559   TRIG 63 05/27/2018 0559   HDL 60 05/27/2018 0559   CHOLHDL 2.2 05/27/2018 0559   VLDL 13 05/27/2018 0559   LDLCALC 56 05/27/2018 0559   HgbA1c:  Lab Results  Component Value Date   HGBA1C 5.2 05/26/2018   Urine Drug Screen:     Component Value Date/Time   LABOPIA NONE DETECTED 05/26/2018 2231   COCAINSCRNUR NONE DETECTED 05/26/2018 2231   LABBENZ NONE DETECTED 05/26/2018 2231   AMPHETMU NONE DETECTED 05/26/2018 2231   THCU NONE DETECTED 05/26/2018 2231   LABBARB NONE DETECTED 05/26/2018 2231    Alcohol Level      Component Value Date/Time   ETH <10 05/26/2018 2240    IMAGING   Ct Angio Head W Or Wo ContrastCt Angio Neck W Or Wo Contrast 05/29/2018 IMPRESSION:   CT head:  1. Stable acute/early subacute stroke within the right caudate body. No new acute intracranial abnormality identified.  2. Stable chronic microvascular ischemic changes and volume loss of the brain.   CTA neck:  1. 4.1 cm ascending aortic aneurysm. Recommend annual imaging followup by CTA or MRA. This recommendation follows 2010 ACCF/AHA/AATS/ACR/ASA/SCA/SCAI/SIR/STS/SVM Guidelines for the Diagnosis and Management of Patients with Thoracic Aortic Disease. Circulation. 2010; 121: N027-O536.  2. Severe stenosis of the right subclavian artery origin.  3. Tandem segments of severe stenosis of right common carotid artery and right proximal internal carotid artery.  4. Mild left common carotid artery and moderate left internal carotid artery stenosis.  5. Absent enhancement within the mid right vertebral artery and gradient enhancement descending from the vertebrobasilar junction probably representing retrograde flow within the right vertebral artery.  6. Widely patent left vertebral  artery.  7. Necrotic masslike opacity with fluid level in the right upper lobe measuring up to 6.9 cm. Findings may represent necrotic pneumonia, old cavitary scarring, or malignancy.  8. 15 mm spiculated nodule in left upper lobe.  9. 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 Parotid neoplasms are more commonly benign, however malignant parotid neoplasms can have a similar appearance.   CTA head:  1. No large vessel occlusion, aneurysm, high-grade stenosis, or vascular malformation.  2. Intracranial atherosclerosis with multiple areas of mild-to-moderate stenosi   US Carotid Bilateral (at [facility]) 05/27/2018 IMPRESSION:  Greater than 70% stenosis in the right internal carotid artery. 50-69%  stenosis in the left internal carotid artery. Right vertebral artery was nonvisualized. Left vertebral artery is antegrade in flow. There is extensive plaque throughout both common carotid arteries and bulbs as described above.    MRI / MRA Brain 05/27/2018 IMPRESSION: Acute infarct right lenticular nucleus. Moderate atrophy and chronic microvascular ischemia. Occluded right vertebral artery.  Decreased signal in the posterior cerebral arteries bilaterally which may be due to proximal severe stenosis.  Decreased signal in the middle cerebral arteries bilaterally likely due to atherosclerotic stenosis at the bifurcation bilaterally.    Transthoracic Echocardiogram - Technically difficult study. Images were off axis and mainly   subcostal. LVEF 40-45%, moderate LVH, global hypokinesis, grade 1   DD, elevated LV filling pressure, calcified aortic valve without   significant stenosis, trivial AI, trivial MR, normal LA size,   normal IVC.    PHYSICAL EXAM Temp:  [97.5 F (36.4 C)-98.3 F (36.8 C)] 97.7 F (36.5 C) (10/19 1139) Pulse Rate:  [55-90] 69 (10/19 1139) Resp:  [18] 18 (10/19 1139) BP: (151-191)/(88-132) 187/103 (10/19 1139) SpO2:  [96 %-98 %] 98 % (10/19 1139) Weight:  [56.9 kg] 56.9 kg (10/18 2048)  General - Well nourished, well developed, in no apparent distress.  Ophthalmologic - fundi not visualized due to noncooperation.  Cardiovascular - irregular heart rhythm.  Mental Status -  Level of arousal and orientation to time, place, and person were intact. Language including expression, naming, repetition, comprehension was assessed and found intact.  Cranial Nerves II - XII - II - Visual field intact OU. III, IV, VI - Extraocular movements intact. V - Facial sensation intact bilaterally. VII - Facial movement intact bilaterally. VIII - Hearing & vestibular intact bilaterally. X - Palate elevates symmetrically. XI - Chin turning & shoulder shrug intact  bilaterally. XII - Tongue protrusion intact.  Motor Strength - The patient's strength was normal in all extremities and pronator drift was absent.  Bulk was normal and fasciculations were absent.   Motor Tone - Muscle tone was assessed at the neck and appendages and was normal.  Reflexes - The patient's reflexes were symmetrical in all extremities and he had no pathological reflexes.  Sensory - Light touch, temperature/pinprick were assessed and were symmetrical.    Coordination - The patient had normal movements in the hands and feet with no ataxia or dysmetria.  Tremor was absent.  Gait and Station - mild stooped posturing, small stride, however normal gait, no dragging, no tendency to fall.   ASSESSMENT/PLAN Mr. [name] is a 84 y.o. male with history of tobacco use, COPD and hypertension presenting with slurred speech,left arm and leg weakness and facial drooping. He did not receive IV t-PA due to late presentation.   Stroke: Acute infarct right lenticular nucleus - artery to artery embolic likely  due to RT ICA severe stenosis  CT head no acute abnormality  MRI head - Acute infarct right lenticular nucleus  MRA head - Occluded right vertebral artery. Decreased signal in the posterior cerebral arteries bilaterally which may be due to proximal severe stenosis.   CTA H&N - Severe stenosis of the right subclavian artery origin. Tandem segments severe stenosis of right common carotid artery and right proximal internal carotid artery.  Absent enhancement within the mid right vertebral artery   Carotid Doppler - Greater than 70% stenosis in the right internal carotid artery. 50-69% stenosis in the left internal carotid artery. Right vertebral artery was nonvisualized.  2D Echo - EF 40 to 45%  LDL - 56  HgbA1c - 5.2  VTE prophylaxis - Lovenox  Diet - Regular  No antithrombotic prior to admission, now on aspirin 325 mg daily.  Continue aspirin for now.  Will not put on  Plavix given potential lung biopsy procedure.  Patient counseled to be compliant with his antithrombotic medications  Ongoing aggressive stroke risk factor management  Therapy recommendations:  HH PT and OT recommended  Disposition:  Pending  Carotid stenosis  CTA head and neck right ICA and CCA tandem stenosis, left ICA 60% stenosis  Carotid Doppler showed left ICA 50 to 69% stenosis and right ICA greater than 70% stenosis  Likely the cause of right CR infarct this time  VVS on board, plan for right CEA next Wednesday  Likely asymptomatic right subclavian steal   CTA head and neck showed right subclavian artery origin severe stenosis  Right vertebral artery occlusion with likely retrograde flow  Patient denies any dizziness or episodes of loss of consciousness  Likely asymptomatic  Need close follow-up with VVS as outpatient  Right lung mass  Concerning for malignancy in the patient of heavy smoking for 65 years  However, patient did have pneumonia couple years ago  CT chest pending  Tobacco abuse  Current smoker  Smoking cessation counseling provided  Pt is willing to quit  Hypertension  BP high but within parameters . Permissive hypertension (OK if < 220/120) but gradually normalize in 5-7 days . Long-term BP goal 130-160 before CEA . Avoid hypotension  Other Stroke Risk Factors  Advanced age  Other Active Problems  15 mm spiculated nodule in left upper lobe -CT chest pe nding.   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  4.1 cm aortic ascending aneurysm  Hypokalemia - supplemented   Hospital day # 1  Rosalin Hawking, MD PhD Stroke Neurology 05/29/2018 3:26 PM    To contact Stroke Continuity provider, please refer to [email or web address] After hours, contact General Neurology

## 2018-05-29 NOTE — Progress Notes (Signed)
Physical Therapy Treatment Patient Details Name: [name] MRN: [id number] DOB: 02-19-1933 Today's Date: 05/29/2018    History of Present Illness Patient is 84 years old male with past medical history of COPD, hypertension presented to the hospital with slurred speech and left-sided weakness.  MRI of the brain showed right lenticular nucleus infarct. Hx of COPD.    PT Comments    Patient continues to make progress. He tolerated treatment well today. He did have one minor loss of balance while he was trying to find his room. He would benefit from home heath therapy at discharge. Therapy will continue to follow patient in the hospital.    Follow Up Recommendations  Home health PT;Supervision - Intermittent     Equipment Recommendations  Rolling walker with 5" wheels    Recommendations for Other Services Rehab consult     Precautions / Restrictions Precautions Precautions: Fall Restrictions Weight Bearing Restrictions: No    Mobility  Bed Mobility Overal bed mobility: Modified Independent             General bed mobility comments: Patient found at the edge of the bed  Transfers Overall transfer level: Needs assistance Equipment used: None   Sit to Stand: Min guard         General transfer comment: min gaurd for inital balance. Patient stood with a flexed posture   Ambulation/Gait Ambulation/Gait assistance: Min guard Gait Distance (Feet): 150 Feet Assistive device: None Gait Pattern/deviations: Step-through pattern;Decreased stride length;Trunk flexed Gait velocity: decreased   General Gait Details: Patient ambualted 150' with contact gaurd. When he got close to his room he tried going in the wrong room then had a slight loss of balance. Therapy had to correct with minimal assistance    Stairs             Wheelchair Mobility    Modified Rankin (Stroke Patients Only) Modified Rankin (Stroke Patients Only) Pre-Morbid Rankin Score: No  significant disability Modified Rankin: Moderate disability     Balance Overall balance assessment: Needs assistance Sitting-balance support: Feet supported;No upper extremity supported Sitting balance-Leahy Scale: Good     Standing balance support: During functional activity;No upper extremity supported Standing balance-Leahy Scale: Fair Standing balance comment: able to complete ADL at sink wihtout external support but unsteady. Furniture walking in room                            Cognition     Overall Cognitive Status: Within Functional Limits for tasks assessed                                        Exercises General Exercises - Lower Extremity Long Arc Quad: 20 reps Hip ABduction/ADduction: 20 reps Hip Flexion/Marching: 20 reps;Seated Toe Raises: 20 reps Heel Raises: 20 reps    General Comments        Pertinent Vitals/Pain Pain Assessment: No/denies pain    Home Living                      Prior Function            PT Goals (current goals can now be found in the care plan section) Acute Rehab PT Goals Patient Stated Goal: to go home PT Goal Formulation: With patient/family Time For Goal Achievement: 06/10/18 Potential to Achieve Goals: Good Progress towards  PT goals: Progressing toward goals    Frequency    Min 4X/week      PT Plan Current plan remains appropriate    Co-evaluation              AM-PAC PT "6 Clicks" Daily Activity  Outcome Measure  Difficulty turning over in bed (including adjusting bedclothes, sheets and blankets)?: None Difficulty moving from lying on back to sitting on the side of the bed? : A Little Difficulty sitting down on and standing up from a chair with arms (e.g., wheelchair, bedside commode, etc,.)?: A Little Help needed moving to and from a bed to chair (including a wheelchair)?: None Help needed walking in hospital room?: A Little Help needed climbing 3-5 steps with a  railing? : A Little 6 Click Score: 20    End of Session Equipment Utilized During Treatment: Gait belt Activity Tolerance: Patient tolerated treatment well Patient left: in bed;with call bell/phone within reach Nurse Communication: Mobility status PT Visit Diagnosis: Unsteadiness on feet (R26.81);Other abnormalities of gait and mobility (R26.89);Muscle weakness (generalized) (M62.81)     Time: 3151-7616 PT Time Calculation (min) (ACUTE ONLY): 17 min  Charges:  $Gait Training: 8-22 mins                      Carney Living PT DPT  05/29/2018, 3:17 PM

## 2018-05-29 NOTE — H&P (View-Only) (Signed)
    Subjective  -   No acute events   Physical Exam:  No neurologic deficits on the left side today. Abdomen soft Cardiovascular: Regular rate and rhythm    I have reviewed the CT scan with the following results: 1. 4.1 cm ascending aortic aneurysm. Recommend annual imaging followup by CTA or MRA. This recommendation follows 2010 ACCF/AHA/AATS/ACR/ASA/SCA/SCAI/SIR/STS/SVM Guidelines for the Diagnosis and Management of Patients with Thoracic Aortic Disease. Circulation. 2010; 121: Q734-L937. 2. Severe stenosis of the right subclavian artery origin. 3. Tandem segments of severe stenosis of right common carotid artery and right proximal internal carotid artery. 4. Mild left common carotid artery and moderate left internal carotid artery stenosis. 5. Absent enhancement within the mid right vertebral artery and gradient enhancement descending from the vertebrobasilar junction probably representing retrograde flow within the right vertebral artery. 6. Widely patent left vertebral artery. 7. Necrotic masslike opacity with fluid level in the right upper lobe measuring up to 6.9 cm. Findings may represent necrotic pneumonia, old cavitary scarring, or malignancy. 8. 15 mm spiculated nodule in left upper lobe. 9. 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 Parotid neoplasms are more commonly benign, however malignant parotid neoplasms can have a similar appearance.   Assessment/Plan:    Symptomatic right carotid stenosis: We discussed proceeding with a right carotid endarterectomy.  I discussed the risk and benefits of the operation including the risk of stroke, nerve injury, and bleeding.  All his questions were answered.  I also spent time discussing smoking cessation.  I would like to have him evaluated by cardiology prior to his endarterectomy which will likely be on Wednesday.  Lung mass: The patient is getting a CT scan of the  chest.  [name] 05/29/2018 2:59 PM --  Vitals:   05/29/18 0812 05/29/18 1139  BP: (!) 191/88 (!) 187/103  Pulse: 71 69  Resp: 18 18  Temp: 98.3 F (36.8 C) 97.7 F (36.5 C)  SpO2: 97% 98%    Intake/Output Summary (Last 24 hours) at 05/29/2018 1459 Last data filed at 05/29/2018 1221 Gross per 24 hour  Intake 2255.83 ml  Output -  Net 2255.83 ml     Laboratory CBC    Component Value Date/Time   WBC 8.5 05/28/2018 0415   HGB 13.7 05/28/2018 0415   HCT 43.5 05/28/2018 0415   PLT 268 05/28/2018 0415    BMET    Component Value Date/Time   NA 134 (L) 05/29/2018 0722   K 3.7 05/29/2018 0722   CL 102 05/29/2018 0722   CO2 23 05/29/2018 0722   GLUCOSE 101 (H) 05/29/2018 0722   BUN 19 05/29/2018 0722   CREATININE 1.26 (H) 05/29/2018 0722   CALCIUM 9.1 05/29/2018 0722   GFRNONAA 51 (L) 05/29/2018 0722   GFRAA 59 (L) 05/29/2018 0722    COAG Lab Results  Component Value Date   INR 0.96 05/26/2018   No results found for: PTT  Antibiotics Anti-infectives (From admission, onward)   None       V. Leia Alf, M.D. [facility] Office: [phone] Pager:  [phone]

## 2018-05-29 NOTE — Progress Notes (Signed)
PROGRESS NOTE        PATIENT DETAILS Name: [name] Age: 84 y.o. Sex: male Date of Birth: 07-05-1933 Admit Date: 05/26/2018 Admitting Physician Oswald Hillock, MD NWG:[id number], Eli Phillips, MD  Brief Narrative: Patient is a 84 y.o. male with history of COPD, hypertension presented with slurred speech and left-sided weakness to [facility], further work-up revealed a acute CVA involving the right lenticular nucleus.  Further work-up demonstrated significant stenosis in the right internal carotid artery-patient was transferred to [facility] for stroke MD/vascular surgery evaluation.  See below for further details.  Subjective: Acute CVA: Deficits seem to have improved-echo with slightly decreased EF but otherwise no embolic source, CTA neck shows tandem segments of severe stenosis in the right common carotid artery and right proximal internal carotid artery.  A1c 5.2, LDL 56.  Telemetry without A. fib.  Continue antiplatelets and statin.  Await further input from vascular surgery and neurology.  Right internal carotid artery stenosis: See above-awaiting input from vascular surgery  Hypertension: Uncontrolled-but will allow permissive hypertension for the next few days-and cautiously continue with Coreg.    COPD: Stable-continue inhalers  Tobacco abuse: Counseled  Incidental finding of right upper lobe necrotic mass on CT angio the neck: We will obtain a dedicated CT scan of the neck for further delineation.  Assessment/Plan: Active Problems:   Stroke (cerebrum) (HCC)   CVA (cerebral vascular accident) ([place])  DVT Prophylaxis: Prophylactic Lovenox   Code Status: Full code   Family Communication: Son at bedside  Disposition Plan: Remain inpatient-await completion of work-up, vascular/stroke MD eval before consideration of discharge.  Antimicrobial agents: Anti-infectives (From admission, onward)   None      Procedures: None  CONSULTS:  neurology and vascular surgery  Time spent: 25- minutes-Greater than 50% of this time was spent in counseling, explanation of diagnosis, planning of further management, and coordination of care.  MEDICATIONS: Scheduled Meds: .  stroke: mapping our early stages of recovery book   Does not apply Once  . aspirin  300 mg Rectal Daily   Or  . aspirin  325 mg Oral Daily  . carvedilol  12.5 mg Oral BID  . enoxaparin (LOVENOX) injection  40 mg Subcutaneous Q24H  . iopamidol      . iopamidol      . mometasone-formoterol  2 puff Inhalation BID   Continuous Infusions: . sodium chloride 50 mL/hr at 05/27/18 1511   PRN Meds:.albuterol, hydrALAZINE, senna-docusate   PHYSICAL EXAM: Vital signs: Vitals:   05/28/18 2321 05/29/18 0326 05/29/18 0812 05/29/18 1139  BP: (!) 162/102 (!) 156/98 (!) 191/88 (!) 187/103  Pulse: (!) 55 85 71 69  Resp: 18 18 18 18   Temp: 98 F (36.7 C) 97.9 F (36.6 C) 98.3 F (36.8 C) 97.7 F (36.5 C)  TempSrc: Oral Oral Oral Oral  SpO2: 97% 97% 97% 98%  Weight:      Height:       Filed Weights   05/26/18 2249 05/28/18 2048  Weight: 58.1 kg 56.9 kg   Body mass index is 17.5 kg/m.   General appearance :Awake, alert, not in any distress.  HEENT: Atraumatic and Normocephalic Neck: supple, no JVD. No cervical lymphadenopathy.  Resp:Good air entry bilaterally, no added sounds  CVS: S1 S2 regular, no murmurs.  GI: Bowel sounds present, Non tender and not distended with no gaurding,  rigidity or rebound.No organomegaly Extremities: B/L Lower Ext shows no edema, both legs are warm to touch Neurology: Appears nonfocal this morning Psychiatric: Normal judgment and insight. Alert and oriented x 3. Normal mood. Musculoskeletal:No digital cyanosis Skin:No Rash, warm and dry Wounds:N/A  I have personally reviewed following labs and imaging studies  LABORATORY DATA: CBC: Recent Labs  Lab 05/26/18 2240 05/26/18 2246 05/28/18 0415  WBC 10.3  --  8.5    NEUTROABS 8.2*  --   --   HGB 14.9 16.7 13.7  HCT 45.7 49.0 43.5  MCV 91.0  --  91.6  PLT 237  --  833    Basic Metabolic Panel: Recent Labs  Lab 05/26/18 2240 05/26/18 2246 05/27/18 2010 05/28/18 0415 05/29/18 0722  NA 132* 134* 134* 133* 134*  K 3.8 3.9 3.7 3.3* 3.7  CL 102 100 103 103 102  CO2 21*  --  19* 19* 23  GLUCOSE 136* 133* 116* 104* 101*  BUN 21 21 14 13 19   CREATININE 1.24 1.20 1.15 1.12 1.26*  CALCIUM 9.4  --  9.5 9.5 9.1  MG  --   --  2.0  --   --     GFR: Estimated Creatinine Clearance: 35.8 mL/min (A) (by C-G formula based on SCr of 1.26 mg/dL (H)).  Liver Function Tests: Recent Labs  Lab 05/26/18 2240 05/27/18 2010  AST 18 22  ALT 11 10  ALKPHOS 83 73  BILITOT 0.6 0.7  PROT 7.7 6.7  ALBUMIN 4.4 3.8   No results for input(s): LIPASE, AMYLASE in the last 168 hours. No results for input(s): AMMONIA in the last 168 hours.  Coagulation Profile: Recent Labs  Lab 05/26/18 2240  INR 0.96    Cardiac Enzymes: No results for input(s): CKTOTAL, CKMB, CKMBINDEX, TROPONINI in the last 168 hours.  BNP (last 3 results) No results for input(s): PROBNP in the last 8760 hours.  HbA1C: Recent Labs    05/26/18 2240  HGBA1C 5.2    CBG: Recent Labs  Lab 05/26/18 2242  GLUCAP 122*    Lipid Profile: Recent Labs    05/27/18 0559  CHOL 129  HDL 60  LDLCALC 56  TRIG 63  CHOLHDL 2.2    Thyroid Function Tests: No results for input(s): TSH, T4TOTAL, FREET4, T3FREE, THYROIDAB in the last 72 hours.  Anemia Panel: No results for input(s): VITAMINB12, FOLATE, FERRITIN, TIBC, IRON, RETICCTPCT in the last 72 hours.  Urine analysis:    Component Value Date/Time   COLORURINE STRAW (A) 05/26/2018 2231   APPEARANCEUR CLEAR 05/26/2018 2231   LABSPEC 1.009 05/26/2018 2231   PHURINE 6.0 05/26/2018 2231   GLUCOSEU NEGATIVE 05/26/2018 2231   HGBUR SMALL (A) 05/26/2018 2231   BILIRUBINUR NEGATIVE 05/26/2018 2231   KETONESUR NEGATIVE 05/26/2018  2231   PROTEINUR 30 (A) 05/26/2018 2231   UROBILINOGEN 1.0 10/20/2014 1515   NITRITE NEGATIVE 05/26/2018 2231   LEUKOCYTESUR NEGATIVE 05/26/2018 2231    Sepsis Labs: Lactic Acid, Venous No results found for: LATICACIDVEN  MICROBIOLOGY: No results found for this or any previous visit (from the past 240 hour(s)).  RADIOLOGY STUDIES/RESULTS: Ct Angio Head W Or Wo Contrast  Result Date: 05/29/2018 CLINICAL DATA:  84 y/o  M; stroke for follow-up. EXAM: CT ANGIOGRAPHY HEAD AND NECK TECHNIQUE: Multidetector CT imaging of the head and neck was performed using the standard protocol during bolus administration of intravenous contrast. Multiplanar CT image reconstructions and MIPs were obtained to evaluate the vascular anatomy. Carotid stenosis measurements (when  applicable) are obtained utilizing NASCET criteria, using the distal internal carotid diameter as the denominator. CONTRAST:  50 cc Isovue 370 COMPARISON:  05/26/2018 CT head. 05/27/2018 MRI and MRA head. 05/27/2018 carotid ultrasound. FINDINGS: CT HEAD FINDINGS Brain: Hypoattenuation within the right caudate body corresponding to acute infarction on prior MRI of the head. No new acute stroke, hemorrhage, focal mass effect, hydrocephalus, extra-axial collection, or herniation. Stable chronic microvascular ischemic changes and volume loss of the brain. Vascular: Calcific atherosclerosis of carotid siphons and vertebral arteries. Skull: Normal. Negative for fracture or focal lesion. Sinuses: Mild frontal sinus and left posterior ethmoid sinus mucosal thickening. Visualized paranasal sinuses are otherwise normally aerated. Normal aeration of mastoid air cells. Orbits: Bilateral intra-ocular lens replacement. Review of the MIP images confirms the above findings CTA NECK FINDINGS Aortic arch: Ascending aortic aneurysm measuring 4.1 cm. Moderate calcific atherosclerosis of the aorta. Right subclavian artery origin predominantly calcified plaque with severe  90% stenosis. Right carotid system: Right common carotid artery mixed plaque with severe stenosis just upstream to the vertebrobasilar junction. Dense calcified plaque of the right proximal internal carotid artery with long segment of severe stenosis. No dissection or occlusion. Left carotid system: Predominant fibrofatty plaque of the common carotid artery with mild less than 50% stenosis. Dense calcified plaque of the left carotid bifurcation and proximal left internal carotid artery with moderate 60% proximal ICA stenosis. Vertebral arteries: Diminutive right vertebral artery. Calcified plaque of the right vertebral artery origin with severe stenosis. There is a long segment of non opacification the right vertebral artery the V2 segment and gradient attenuation of the upper vertebral artery in the neck from the vertebrobasilar junction, probably representing reversal of flow. Widely patent dominant left vertebral artery. Skeleton: C4-5 grade 1 anterolisthesis and mild reversal of curvature at C5-6. Moderate to severe discogenic degenerative changes at C5-C7. Prominent multilevel cervical facet arthrosis. Other neck: Left upper lobe spiculated nodule measuring 15 x 14 mm (series 9, image 169). Right upper lobe necrotic masslike lesion containing fluid level measuring 3.3 x 6.9 x 5.3 cm (AP x ML x CC series 9, image 162 and series 12, image 128). Upper chest: 8 x 11 mm nodule within the parotid gland and superficial deep junction (series 11, image 182). Review of the MIP images confirms the above findings CTA HEAD FINDINGS Anterior circulation: Calcific atherosclerosis of the carotid siphons. Mild-to-moderate left cavernous and mild bilateral paraclinoid ICA stenosis. Patent bilateral ACA and MCA without high-grade stenosis or aneurysm. Mild distal left M1 stenosis. Posterior circulation: No high-grade stenosis, proximal occlusion, aneurysm, or vascular malformation. Moderate distal left P2 and mild right P1  stenosis. Venous sinuses: As permitted by contrast timing, patent. Anatomic variants: None significant. Delayed phase: No abnormal intracranial enhancement. Review of the MIP images confirms the above findings IMPRESSION: CT head: 1. Stable acute/early subacute stroke within the right caudate body. No new acute intracranial abnormality identified. 2. Stable chronic microvascular ischemic changes and volume loss of the brain. CTA neck: 1. 4.1 cm ascending aortic aneurysm. Recommend annual imaging followup by CTA or MRA. This recommendation follows 2010 ACCF/AHA/AATS/ACR/ASA/SCA/SCAI/SIR/STS/SVM Guidelines for the Diagnosis and Management of Patients with Thoracic Aortic Disease. Circulation. 2010; 121: L381-O175. 2. Severe stenosis of the right subclavian artery origin. 3. Tandem segments of severe stenosis of right common carotid artery and right proximal internal carotid artery. 4. Mild left common carotid artery and moderate left internal carotid artery stenosis. 5. Absent enhancement within the mid right vertebral artery and gradient enhancement descending from the  vertebrobasilar junction probably representing retrograde flow within the right vertebral artery. 6. Widely patent left vertebral artery. 7. Necrotic masslike opacity with fluid level in the right upper lobe measuring up to 6.9 cm. Findings may represent necrotic pneumonia, old cavitary scarring, or malignancy. 8. 15 mm spiculated nodule in left upper lobe. 9. 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 Parotid neoplasms are more commonly benign, however malignant parotid neoplasms can have a similar appearance. CTA head: 1. No large vessel occlusion, aneurysm, high-grade stenosis, or vascular malformation. 2. Intracranial atherosclerosis with multiple areas of mild-to-moderate stenosis. Electronically Signed   By: Kristine Garbe M.D.   On: 05/29/2018 04:48   Dg Chest 2 View  Result Date:  05/27/2018 CLINICAL DATA:  Stroke-like symptoms EXAM: CHEST - 2 VIEW COMPARISON:  10/19/2014 FINDINGS: Linear atelectasis or scar in the right upper lobe. Hyperinflated lungs. No focal opacity or pleural effusion. Normal cardiomediastinal silhouette with aortic atherosclerosis. Emphysematous changes. Multiple mild lower thoracic compression deformities. IMPRESSION: Hyperinflation with emphysematous changes and atelectasis or scarring in the right upper lobe. Electronically Signed   By: Donavan Foil M.D.   On: 05/27/2018 00:49   Ct Angio Neck W Or Wo Contrast  Result Date: 05/29/2018 CLINICAL DATA:  84 y/o  M; stroke for follow-up. EXAM: CT ANGIOGRAPHY HEAD AND NECK TECHNIQUE: Multidetector CT imaging of the head and neck was performed using the standard protocol during bolus administration of intravenous contrast. Multiplanar CT image reconstructions and MIPs were obtained to evaluate the vascular anatomy. Carotid stenosis measurements (when applicable) are obtained utilizing NASCET criteria, using the distal internal carotid diameter as the denominator. CONTRAST:  50 cc Isovue 370 COMPARISON:  05/26/2018 CT head. 05/27/2018 MRI and MRA head. 05/27/2018 carotid ultrasound. FINDINGS: CT HEAD FINDINGS Brain: Hypoattenuation within the right caudate body corresponding to acute infarction on prior MRI of the head. No new acute stroke, hemorrhage, focal mass effect, hydrocephalus, extra-axial collection, or herniation. Stable chronic microvascular ischemic changes and volume loss of the brain. Vascular: Calcific atherosclerosis of carotid siphons and vertebral arteries. Skull: Normal. Negative for fracture or focal lesion. Sinuses: Mild frontal sinus and left posterior ethmoid sinus mucosal thickening. Visualized paranasal sinuses are otherwise normally aerated. Normal aeration of mastoid air cells. Orbits: Bilateral intra-ocular lens replacement. Review of the MIP images confirms the above findings CTA NECK  FINDINGS Aortic arch: Ascending aortic aneurysm measuring 4.1 cm. Moderate calcific atherosclerosis of the aorta. Right subclavian artery origin predominantly calcified plaque with severe 90% stenosis. Right carotid system: Right common carotid artery mixed plaque with severe stenosis just upstream to the vertebrobasilar junction. Dense calcified plaque of the right proximal internal carotid artery with long segment of severe stenosis. No dissection or occlusion. Left carotid system: Predominant fibrofatty plaque of the common carotid artery with mild less than 50% stenosis. Dense calcified plaque of the left carotid bifurcation and proximal left internal carotid artery with moderate 60% proximal ICA stenosis. Vertebral arteries: Diminutive right vertebral artery. Calcified plaque of the right vertebral artery origin with severe stenosis. There is a long segment of non opacification the right vertebral artery the V2 segment and gradient attenuation of the upper vertebral artery in the neck from the vertebrobasilar junction, probably representing reversal of flow. Widely patent dominant left vertebral artery. Skeleton: C4-5 grade 1 anterolisthesis and mild reversal of curvature at C5-6. Moderate to severe discogenic degenerative changes at C5-C7. Prominent multilevel cervical facet arthrosis. Other neck: Left upper lobe spiculated nodule measuring 15 x  14 mm (series 9, image 169). Right upper lobe necrotic masslike lesion containing fluid level measuring 3.3 x 6.9 x 5.3 cm (AP x ML x CC series 9, image 162 and series 12, image 128). Upper chest: 8 x 11 mm nodule within the parotid gland and superficial deep junction (series 11, image 182). Review of the MIP images confirms the above findings CTA HEAD FINDINGS Anterior circulation: Calcific atherosclerosis of the carotid siphons. Mild-to-moderate left cavernous and mild bilateral paraclinoid ICA stenosis. Patent bilateral ACA and MCA without high-grade stenosis or  aneurysm. Mild distal left M1 stenosis. Posterior circulation: No high-grade stenosis, proximal occlusion, aneurysm, or vascular malformation. Moderate distal left P2 and mild right P1 stenosis. Venous sinuses: As permitted by contrast timing, patent. Anatomic variants: None significant. Delayed phase: No abnormal intracranial enhancement. Review of the MIP images confirms the above findings IMPRESSION: CT head: 1. Stable acute/early subacute stroke within the right caudate body. No new acute intracranial abnormality identified. 2. Stable chronic microvascular ischemic changes and volume loss of the brain. CTA neck: 1. 4.1 cm ascending aortic aneurysm. Recommend annual imaging followup by CTA or MRA. This recommendation follows 2010 ACCF/AHA/AATS/ACR/ASA/SCA/SCAI/SIR/STS/SVM Guidelines for the Diagnosis and Management of Patients with Thoracic Aortic Disease. Circulation. 2010; 121: T614-E315. 2. Severe stenosis of the right subclavian artery origin. 3. Tandem segments of severe stenosis of right common carotid artery and right proximal internal carotid artery. 4. Mild left common carotid artery and moderate left internal carotid artery stenosis. 5. Absent enhancement within the mid right vertebral artery and gradient enhancement descending from the vertebrobasilar junction probably representing retrograde flow within the right vertebral artery. 6. Widely patent left vertebral artery. 7. Necrotic masslike opacity with fluid level in the right upper lobe measuring up to 6.9 cm. Findings may represent necrotic pneumonia, old cavitary scarring, or malignancy. 8. 15 mm spiculated nodule in left upper lobe. 9. 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 Parotid neoplasms are more commonly benign, however malignant parotid neoplasms can have a similar appearance. CTA head: 1. No large vessel occlusion, aneurysm, high-grade stenosis, or vascular malformation. 2. Intracranial  atherosclerosis with multiple areas of mild-to-moderate stenosis. Electronically Signed   By: Kristine Garbe M.D.   On: 05/29/2018 04:48   Mr Brain Wo Contrast  Result Date: 05/27/2018 CLINICAL DATA:  Focal neuro deficit less than 6 hours. Suspect stroke. Left-sided weakness. EXAM: MRI HEAD WITHOUT CONTRAST MRA HEAD WITHOUT CONTRAST TECHNIQUE: Multiplanar, multiecho pulse sequences of the brain and surrounding structures were obtained without intravenous contrast. Angiographic images of the head were obtained using MRA technique without contrast. COMPARISON:  CT head 05/26/2018 FINDINGS: MRI HEAD FINDINGS Brain: Acute infarct right lenticular nucleus involving the body of the caudate and deep white matter tracts. No other acute infarct. Moderate atrophy. Chronic microvascular ischemic changes in the white matter. Negative for hemorrhage or mass. Vascular: Normal arterial flow voids Skull and upper cervical spine: Negative Sinuses/Orbits: Bilateral cataract surgery. Paranasal sinuses clear. Other: None MRA HEAD FINDINGS Left vertebral artery widely patent and supplies the basilar. Distal right vertebral artery not visualized. PICA patent on the left. Superior cerebellar artery patent bilaterally. Posterior cerebral arteries have decreased signal distally which may be artifactual or due to proximal stenosis. Internal carotid artery patent bilaterally. Mild atherosclerotic stenosis in the cavernous carotid bilaterally. Anterior cerebral arteries patent bilaterally. Decreased signal in the middle cerebral artery branches bilaterally which may be due to severe M1 segment stenosis bilaterally versus artifact. IMPRESSION: Acute infarct  right lenticular nucleus. Moderate atrophy and chronic microvascular ischemia. Occluded right vertebral artery. Decreased signal in the posterior cerebral arteries bilaterally which may be due to proximal severe stenosis. Decreased signal in the middle cerebral arteries  bilaterally likely due to atherosclerotic stenosis at the bifurcation bilaterally. These results will be called to the ordering clinician or representative by the Radiologist Assistant, and communication documented in the PACS or zVision Dashboard. Electronically Signed   By: Franchot Gallo M.D.   On: 05/27/2018 08:11   US Carotid Bilateral (at [facility] And Ap Only)  Result Date: 05/27/2018 CLINICAL DATA:  Stroke EXAM: BILATERAL CAROTID DUPLEX ULTRASOUND TECHNIQUE: Pearline Cables scale imaging, color Doppler and duplex ultrasound were performed of bilateral carotid and vertebral arteries in the neck. COMPARISON:  None. FINDINGS: Criteria: Quantification of carotid stenosis is based on velocity parameters that correlate the residual internal carotid diameter with NASCET-based stenosis levels, using the diameter of the distal internal carotid lumen as the denominator for stenosis measurement. The following velocity measurements were obtained: RIGHT ICA: 410 cm/sec CCA: 75 cm/sec SYSTOLIC ICA/CCA RATIO:  5.5 ECA: 218 cm/sec LEFT ICA: 39 cm/sec CCA: 993 cm/sec SYSTOLIC ICA/CCA RATIO:  0.7 ECA: 153 cm/sec RIGHT CAROTID ARTERY: Focal calcified plaque in the lower common carotid artery. Scattered calcified plaque along the wall of the mid and upper common carotid artery. Advanced calcified plaque in the bulb with shadowing of the lumen. Low resistance internal carotid Doppler pattern is preserved. Calcified plaque extends into the lower internal carotid artery. RIGHT VERTEBRAL ARTERY:  Nonvisualized LEFT CAROTID ARTERY: There is extensive irregular calcified plaque in the lower common carotid artery. There is prominent smooth mixed plaque in the mid and upper common carotid artery. There is advanced calcified plaque in the bulb with shadowing of the lumen. LEFT VERTEBRAL ARTERY:  Antegrade. IMPRESSION: Greater than 70% stenosis in the right internal carotid artery. 50-69% stenosis in the left internal carotid artery. Right  vertebral artery was nonvisualized. Left vertebral artery is antegrade in flow. There is extensive plaque throughout both common carotid arteries and bulbs as described above. Electronically Signed   By: Marybelle Killings M.D.   On: 05/27/2018 08:57   Mr Jodene Nam Head/brain ZJ Cm  Result Date: 05/27/2018 CLINICAL DATA:  Focal neuro deficit less than 6 hours. Suspect stroke. Left-sided weakness. EXAM: MRI HEAD WITHOUT CONTRAST MRA HEAD WITHOUT CONTRAST TECHNIQUE: Multiplanar, multiecho pulse sequences of the brain and surrounding structures were obtained without intravenous contrast. Angiographic images of the head were obtained using MRA technique without contrast. COMPARISON:  CT head 05/26/2018 FINDINGS: MRI HEAD FINDINGS Brain: Acute infarct right lenticular nucleus involving the body of the caudate and deep white matter tracts. No other acute infarct. Moderate atrophy. Chronic microvascular ischemic changes in the white matter. Negative for hemorrhage or mass. Vascular: Normal arterial flow voids Skull and upper cervical spine: Negative Sinuses/Orbits: Bilateral cataract surgery. Paranasal sinuses clear. Other: None MRA HEAD FINDINGS Left vertebral artery widely patent and supplies the basilar. Distal right vertebral artery not visualized. PICA patent on the left. Superior cerebellar artery patent bilaterally. Posterior cerebral arteries have decreased signal distally which may be artifactual or due to proximal stenosis. Internal carotid artery patent bilaterally. Mild atherosclerotic stenosis in the cavernous carotid bilaterally. Anterior cerebral arteries patent bilaterally. Decreased signal in the middle cerebral artery branches bilaterally which may be due to severe M1 segment stenosis bilaterally versus artifact. IMPRESSION: Acute infarct right lenticular nucleus. Moderate atrophy and chronic microvascular ischemia. Occluded right vertebral artery. Decreased signal in the  posterior cerebral arteries bilaterally  which may be due to proximal severe stenosis. Decreased signal in the middle cerebral arteries bilaterally likely due to atherosclerotic stenosis at the bifurcation bilaterally. These results will be called to the ordering clinician or representative by the Radiologist Assistant, and communication documented in the PACS or zVision Dashboard. Electronically Signed   By: Franchot Gallo M.D.   On: 05/27/2018 08:11   Ct Head Code Stroke Wo Contrast  Result Date: 05/26/2018 CLINICAL DATA:  Code stroke.  Left leg weakness EXAM: CT HEAD WITHOUT CONTRAST TECHNIQUE: Contiguous axial images were obtained from the base of the skull through the vertex without intravenous contrast. COMPARISON:  None. FINDINGS: Brain: There is no mass, hemorrhage or extra-axial collection. There is generalized atrophy without lobar predilection. There is no acute or chronic infarction. There is hypoattenuation of the periventricular white matter, most commonly indicating chronic ischemic microangiopathy. Vascular: No abnormal hyperdensity of the major intracranial arteries or dural venous sinuses. No intracranial atherosclerosis. Skull: The visualized skull base, calvarium and extracranial soft tissues are normal. Sinuses/Orbits: No fluid levels or advanced mucosal thickening of the visualized paranasal sinuses. No mastoid or middle ear effusion. The orbits are normal. ASPECTS [facility] Stroke Program Early CT Score) - Ganglionic level infarction (caudate, lentiform nuclei, internal capsule, insula, M1-M3 cortex): 7 - Supraganglionic infarction (M4-M6 cortex): 3 Total score (0-10 with 10 being normal): 10 IMPRESSION: 1. No intracranial hemorrhage. 2. ASPECTS is 10. These results were called by telephone at the time of interpretation on 05/26/2018 at 10:49 pm to Dr. Nat Christen , who verbally acknowledged these results. Electronically Signed   By: Ulyses Jarred M.D.   On: 05/26/2018 22:50     LOS: 1 day   Oren Binet, MD  [facility]  If 7PM-7AM, please contact night-coverage  Please page via [email or web address]-click on MD name and type text message  05/29/2018, 11:42 AM

## 2018-05-29 NOTE — Progress Notes (Signed)
    Subjective  -   No acute events   Physical Exam:  No neurologic deficits on the left side today. Abdomen soft Cardiovascular: Regular rate and rhythm    I have reviewed the CT scan with the following results: 1. 4.1 cm ascending aortic aneurysm. Recommend annual imaging followup by CTA or MRA. This recommendation follows 2010 ACCF/AHA/AATS/ACR/ASA/SCA/SCAI/SIR/STS/SVM Guidelines for the Diagnosis and Management of Patients with Thoracic Aortic Disease. Circulation. 2010; 121: K998-P382. 2. Severe stenosis of the right subclavian artery origin. 3. Tandem segments of severe stenosis of right common carotid artery and right proximal internal carotid artery. 4. Mild left common carotid artery and moderate left internal carotid artery stenosis. 5. Absent enhancement within the mid right vertebral artery and gradient enhancement descending from the vertebrobasilar junction probably representing retrograde flow within the right vertebral artery. 6. Widely patent left vertebral artery. 7. Necrotic masslike opacity with fluid level in the right upper lobe measuring up to 6.9 cm. Findings may represent necrotic pneumonia, old cavitary scarring, or malignancy. 8. 15 mm spiculated nodule in left upper lobe. 9. 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 Parotid neoplasms are more commonly benign, however malignant parotid neoplasms can have a similar appearance.   Assessment/Plan:    Symptomatic right carotid stenosis: We discussed proceeding with a right carotid endarterectomy.  I discussed the risk and benefits of the operation including the risk of stroke, nerve injury, and bleeding.  All his questions were answered.  I also spent time discussing smoking cessation.  I would like to have him evaluated by cardiology prior to his endarterectomy which will likely be on Wednesday.  Lung mass: The patient is getting a CT scan of the  chest.  [name] 05/29/2018 2:59 PM --  Vitals:   05/29/18 0812 05/29/18 1139  BP: (!) 191/88 (!) 187/103  Pulse: 71 69  Resp: 18 18  Temp: 98.3 F (36.8 C) 97.7 F (36.5 C)  SpO2: 97% 98%    Intake/Output Summary (Last 24 hours) at 05/29/2018 1459 Last data filed at 05/29/2018 1221 Gross per 24 hour  Intake 2255.83 ml  Output -  Net 2255.83 ml     Laboratory CBC    Component Value Date/Time   WBC 8.5 05/28/2018 0415   HGB 13.7 05/28/2018 0415   HCT 43.5 05/28/2018 0415   PLT 268 05/28/2018 0415    BMET    Component Value Date/Time   NA 134 (L) 05/29/2018 0722   K 3.7 05/29/2018 0722   CL 102 05/29/2018 0722   CO2 23 05/29/2018 0722   GLUCOSE 101 (H) 05/29/2018 0722   BUN 19 05/29/2018 0722   CREATININE 1.26 (H) 05/29/2018 0722   CALCIUM 9.1 05/29/2018 0722   GFRNONAA 51 (L) 05/29/2018 0722   GFRAA 59 (L) 05/29/2018 0722    COAG Lab Results  Component Value Date   INR 0.96 05/26/2018   No results found for: PTT  Antibiotics Anti-infectives (From admission, onward)   None       V. Leia Alf, M.D. [facility] Office: [phone] Pager:  [phone]

## 2018-05-30 DIAGNOSIS — Z720 Tobacco use: Secondary | ICD-10-CM

## 2018-05-30 DIAGNOSIS — Z01810 Encounter for preprocedural cardiovascular examination: Secondary | ICD-10-CM

## 2018-05-30 DIAGNOSIS — I6521 Occlusion and stenosis of right carotid artery: Secondary | ICD-10-CM

## 2018-05-30 DIAGNOSIS — R9431 Abnormal electrocardiogram [ECG] [EKG]: Secondary | ICD-10-CM

## 2018-05-30 LAB — ECHOCARDIOGRAM COMPLETE
Height: 71 in
Weight: 2048 oz

## 2018-05-30 MED ORDER — METOPROLOL TARTRATE 50 MG PO TABS
50.0000 mg | ORAL_TABLET | Freq: Two times a day (BID) | ORAL | Status: DC
  Administered 2018-05-30: 50 mg via ORAL
  Filled 2018-05-30: qty 1

## 2018-05-30 MED ORDER — METOPROLOL TARTRATE 50 MG PO TABS: 50.0000 mg | ORAL_TABLET | Freq: Two times a day (BID) | ORAL | 0 refills | Status: AC

## 2018-05-30 MED ORDER — PANTOPRAZOLE SODIUM 40 MG PO TBEC
40.0000 mg | DELAYED_RELEASE_TABLET | Freq: Every day | ORAL | Status: DC
  Administered 2018-05-30: 40 mg via ORAL
  Filled 2018-05-30: qty 1

## 2018-05-30 MED ORDER — ATORVASTATIN CALCIUM 40 MG PO TABS: 40.0000 mg | ORAL_TABLET | Freq: Every day | ORAL | 0 refills | Status: AC

## 2018-05-30 MED ORDER — GI COCKTAIL ~~LOC~~
30.0000 mL | Freq: Three times a day (TID) | ORAL | Status: DC | PRN
  Filled 2018-05-30: qty 30

## 2018-05-30 MED ORDER — LOSARTAN POTASSIUM 25 MG PO TABS: 25.0000 mg | ORAL_TABLET | Freq: Every day | ORAL | 0 refills | Status: DC

## 2018-05-30 MED ORDER — LOSARTAN POTASSIUM 25 MG PO TABS
25.0000 mg | ORAL_TABLET | Freq: Every day | ORAL | Status: DC
  Administered 2018-05-30: 25 mg via ORAL
  Filled 2018-05-30: qty 1

## 2018-05-30 MED ORDER — NICOTINE 21 MG/24HR TD PT24
21.0000 mg | MEDICATED_PATCH | Freq: Every day | TRANSDERMAL | Status: DC
  Administered 2018-05-30: 21 mg via TRANSDERMAL
  Filled 2018-05-30: qty 1

## 2018-05-30 MED ORDER — NICOTINE 21 MG/24HR TD PT24: 21.0000 mg | MEDICATED_PATCH | Freq: Every day | TRANSDERMAL | 0 refills | Status: DC

## 2018-05-30 MED ORDER — ASPIRIN 325 MG PO TABS: 325.0000 mg | ORAL_TABLET | Freq: Every day | ORAL | 0 refills | Status: AC

## 2018-05-30 NOTE — Progress Notes (Signed)
NURSING PROGRESS NOTE  [name] [id number] Discharge Data: 05/30/2018 5:59 PM Attending Provider: Jonetta Osgood, MD [id number], Travis Phillips, MD     [name] to be D/C'd Home per MD order.  Discussed with the patient the After Visit Summary and all questions fully answered. All belongings returned to patient for patient to take home.  Equipments sent home with patient.  Last Vital Signs:  Blood pressure (!) 149/85, pulse 66, temperature 98.1 F (36.7 C), temperature source Oral, resp. rate 16, height 5\' 11"  (1.803 m), weight 56.9 kg, SpO2 98 %.  Discharge Medication List Allergies as of 05/30/2018   No Known Allergies     Medication List    STOP taking these medications   amLODipine 10 MG tablet Commonly known as:  NORVASC   carvedilol 12.5 MG tablet Commonly known as:  COREG   naproxen sodium 275 MG tablet Commonly known as:  ANAPROX   valsartan 160 MG tablet Commonly known as:  DIOVAN     TAKE these medications   albuterol 108 (90 Base) MCG/ACT inhaler Commonly known as:  PROVENTIL HFA;VENTOLIN HFA Inhale 2 puffs into the lungs every 6 (six) hours as needed for wheezing.   albuterol (2.5 MG/3ML) 0.083% nebulizer solution Commonly known as:  PROVENTIL Take 3 mLs (2.5 mg total) by nebulization every 4 (four) hours as needed for wheezing.   aspirin 325 MG tablet Take 1 tablet (325 mg total) by mouth daily. Start taking on:  05/31/2018   atorvastatin 40 MG tablet Commonly known as:  LIPITOR Take 1 tablet (40 mg total) by mouth daily at 6 PM.   losartan 25 MG tablet Commonly known as:  COZAAR Take 1 tablet (25 mg total) by mouth daily. What changed:    medication strength  how much to take   metoprolol tartrate 50 MG tablet Commonly known as:  LOPRESSOR Take 1 tablet (50 mg total) by mouth 2 (two) times daily.   nicotine 21 mg/24hr patch Commonly known as:  NICODERM CQ - dosed in mg/24 hours Place 1 patch (21 mg total) onto the skin  daily. Start taking on:  05/31/2018   SYMBICORT 160-4.5 MCG/ACT inhaler Generic drug:  budesonide-formoterol Inhale 2 puffs into the lungs 2 (two) times daily.   tamsulosin 0.4 MG Caps capsule Commonly known as:  FLOMAX Take 1 capsule by mouth daily.   traMADol 50 MG tablet Commonly known as:  ULTRAM Take 50 mg by mouth 3 (three) times daily.            Durable Medical Equipment  (From admission, onward)         Start     Ordered   05/30/18 1637  For home use only DME Walker rolling  [facility])  Once    Question:  Patient needs a walker to treat with the following condition  Answer:  CVA (cerebral vascular accident) ([place])   05/30/18 1640   05/28/18 1257  For home use only DME 3 n 1  Once     05/28/18 1257   05/28/18 1256  For home use only DME Walker rolling  Once    Question:  Patient needs a walker to treat with the following condition  Answer:  Physical deconditioning   05/28/18 1257

## 2018-05-30 NOTE — Progress Notes (Signed)
PROGRESS NOTE        PATIENT DETAILS Name: [name] Age: 84 y.o. Sex: male Date of Birth: [birth year] Admit Date: 05/26/2018 Admitting Physician Oswald Hillock, MD [id number], Eli Phillips, MD  Brief Narrative: Patient is a 84 y.o. male with history of COPD, hypertension presented with slurred speech and left-sided weakness to [facility], further work-up revealed a acute CVA involving the right lenticular nucleus.  Further work-up demonstrated significant stenosis in the right internal carotid artery-patient was transferred to [facility] for stroke MD/vascular surgery evaluation.  See below for further details.  Subjective: Acute CVA: Likely thromboembolic in the setting of severe right ICA stenosis, non-focal-neurological deficits have improved. CTA neck shows tandem segments of severe stenosis in the right common carotid artery and right proximal internal carotid artery.  Transthoracic echo shows EF around 40 to 45%.  A1c 5.2, LDL 56.  Telemetry shows sinus rhythm.  Continue with antiplatelets and statin.  Vascular surgery planning on right CEA this Wednesday.  Needs preop cardiology evaluation-they have been consulted today.  If no further work-up planned by cardiology-he could be potentially discharged and brought back as an outpatient for carotid endarterectomy.    Right internal carotid artery stenosis: See above-right CEA plan for this Wednesday  Hypertension: Fluctuating but BP on the higher side-allow some amount of permissive hypertension-given the right internal carotid artery stenosis.  Continue cautiously with Coreg.   COPD: Stable-continue inhalers  Tobacco abuse: Counseled-starting transdermal nicotine.  Right upper lobe lung necrotic mass: Incidentally found on imaging done for work-up of carotid stenosis-does not have any signs of pneumonia-therefore think this is a primary lung malignancy.  Discussed this finding with both patient and son at  bedside-they are aware that they need to seek an outpatient oncology opinion-probably a PET scan after carotid endarterectomy has been completed.  Assessment/Plan: Active Problems:   Stroke (cerebrum) (HCC)   CVA (cerebral vascular accident) ([place])  DVT Prophylaxis: Prophylactic Lovenox   Code Status: Full code   Family Communication: Son at bedside  Disposition Plan: Remain inpatient-await preop cardiology evaluation-potential work-up-before consideration of discharge.    Antimicrobial agents: Anti-infectives (From admission, onward)   None      Procedures: None  CONSULTS:  neurology and vascular surgery  cardiology  Time spent: 25- minutes-Greater than 50% of this time was spent in counseling, explanation of diagnosis, planning of further management, and coordination of care.  MEDICATIONS: Scheduled Meds: .  stroke: mapping our early stages of recovery book   Does not apply Once  . aspirin  300 mg Rectal Daily   Or  . aspirin  325 mg Oral Daily  . atorvastatin  40 mg Oral q1800  . carvedilol  12.5 mg Oral BID  . enoxaparin (LOVENOX) injection  40 mg Subcutaneous Q24H  . mometasone-formoterol  2 puff Inhalation BID  . nicotine  21 mg Transdermal Daily  . pantoprazole  40 mg Oral Q1200   Continuous Infusions: . sodium chloride 50 mL/hr at 05/27/18 1511   PRN Meds:.albuterol, gi cocktail, hydrALAZINE, senna-docusate   PHYSICAL EXAM: Vital signs: Vitals:   05/29/18 2349 05/30/18 0324 05/30/18 0746 05/30/18 0751  BP: (!) 155/86 (!) 179/98  (!) 155/101  Pulse: 75 63  68  Resp: 18 18  18   Temp: 98 F (36.7 C) 98.1 F (36.7 C)  98 F (36.7 C)  TempSrc:  Oral Oral  Oral  SpO2: 98% 97% 96% 96%  Weight:      Height:       Filed Weights   05/26/18 2249 05/28/18 2048  Weight: 58.1 kg 56.9 kg   Body mass index is 17.5 kg/m.   General appearance:Awake, alert, not in any distress.  Eyes:no scleral icterus. HEENT: Atraumatic and Normocephalic Neck:  supple, no JVD. Resp:Good air entry bilaterally,no rales or rhonchi CVS: S1 S2 regular, no murmurs.  GI: Bowel sounds present, Non tender and not distended with no gaurding, rigidity or rebound. Extremities: B/L Lower Ext shows no edema, both legs are warm to touch Neurology:  Non focal Psychiatric: Normal judgment and insight. Normal mood. Musculoskeletal:No digital cyanosis Skin:No Rash, warm and dry Wounds:N/A  I have personally reviewed following labs and imaging studies  LABORATORY DATA: CBC: Recent Labs  Lab 05/26/18 2240 05/26/18 2246 05/28/18 0415  WBC 10.3  --  8.5  NEUTROABS 8.2*  --   --   HGB 14.9 16.7 13.7  HCT 45.7 49.0 43.5  MCV 91.0  --  91.6  PLT 237  --  409    Basic Metabolic Panel: Recent Labs  Lab 05/26/18 2240 05/26/18 2246 05/27/18 2010 05/28/18 0415 05/29/18 0722  NA 132* 134* 134* 133* 134*  K 3.8 3.9 3.7 3.3* 3.7  CL 102 100 103 103 102  CO2 21*  --  19* 19* 23  GLUCOSE 136* 133* 116* 104* 101*  BUN 21 21 14 13 19   CREATININE 1.24 1.20 1.15 1.12 1.26*  CALCIUM 9.4  --  9.5 9.5 9.1  MG  --   --  2.0  --   --     GFR: Estimated Creatinine Clearance: 35.8 mL/min (A) (by C-G formula based on SCr of 1.26 mg/dL (H)).  Liver Function Tests: Recent Labs  Lab 05/26/18 2240 05/27/18 2010  AST 18 22  ALT 11 10  ALKPHOS 83 73  BILITOT 0.6 0.7  PROT 7.7 6.7  ALBUMIN 4.4 3.8   No results for input(s): LIPASE, AMYLASE in the last 168 hours. No results for input(s): AMMONIA in the last 168 hours.  Coagulation Profile: Recent Labs  Lab 05/26/18 2240  INR 0.96    Cardiac Enzymes: No results for input(s): CKTOTAL, CKMB, CKMBINDEX, TROPONINI in the last 168 hours.  BNP (last 3 results) No results for input(s): PROBNP in the last 8760 hours.  HbA1C: No results for input(s): HGBA1C in the last 72 hours.  CBG: Recent Labs  Lab 05/26/18 2242  GLUCAP 122*    Lipid Profile: No results for input(s): CHOL, HDL, LDLCALC, TRIG,  CHOLHDL, LDLDIRECT in the last 72 hours.  Thyroid Function Tests: No results for input(s): TSH, T4TOTAL, FREET4, T3FREE, THYROIDAB in the last 72 hours.  Anemia Panel: No results for input(s): VITAMINB12, FOLATE, FERRITIN, TIBC, IRON, RETICCTPCT in the last 72 hours.  Urine analysis:    Component Value Date/Time   COLORURINE STRAW (A) 05/26/2018 2231   APPEARANCEUR CLEAR 05/26/2018 2231   LABSPEC 1.009 05/26/2018 2231   PHURINE 6.0 05/26/2018 2231   GLUCOSEU NEGATIVE 05/26/2018 2231   HGBUR SMALL (A) 05/26/2018 2231   BILIRUBINUR NEGATIVE 05/26/2018 2231   KETONESUR NEGATIVE 05/26/2018 2231   PROTEINUR 30 (A) 05/26/2018 2231   UROBILINOGEN 1.0 10/20/2014 1515   NITRITE NEGATIVE 05/26/2018 2231   LEUKOCYTESUR NEGATIVE 05/26/2018 2231    Sepsis Labs: Lactic Acid, Venous No results found for: LATICACIDVEN  MICROBIOLOGY: No results found for this or any previous  visit (from the past 240 hour(s)).  RADIOLOGY STUDIES/RESULTS: Ct Angio Head W Or Wo Contrast  Result Date: 05/29/2018 CLINICAL DATA:  84 y/o  M; stroke for follow-up. EXAM: CT ANGIOGRAPHY HEAD AND NECK TECHNIQUE: Multidetector CT imaging of the head and neck was performed using the standard protocol during bolus administration of intravenous contrast. Multiplanar CT image reconstructions and MIPs were obtained to evaluate the vascular anatomy. Carotid stenosis measurements (when applicable) are obtained utilizing NASCET criteria, using the distal internal carotid diameter as the denominator. CONTRAST:  50 cc Isovue 370 COMPARISON:  05/26/2018 CT head. 05/27/2018 MRI and MRA head. 05/27/2018 carotid ultrasound. FINDINGS: CT HEAD FINDINGS Brain: Hypoattenuation within the right caudate body corresponding to acute infarction on prior MRI of the head. No new acute stroke, hemorrhage, focal mass effect, hydrocephalus, extra-axial collection, or herniation. Stable chronic microvascular ischemic changes and volume loss of the brain.  Vascular: Calcific atherosclerosis of carotid siphons and vertebral arteries. Skull: Normal. Negative for fracture or focal lesion. Sinuses: Mild frontal sinus and left posterior ethmoid sinus mucosal thickening. Visualized paranasal sinuses are otherwise normally aerated. Normal aeration of mastoid air cells. Orbits: Bilateral intra-ocular lens replacement. Review of the MIP images confirms the above findings CTA NECK FINDINGS Aortic arch: Ascending aortic aneurysm measuring 4.1 cm. Moderate calcific atherosclerosis of the aorta. Right subclavian artery origin predominantly calcified plaque with severe 90% stenosis. Right carotid system: Right common carotid artery mixed plaque with severe stenosis just upstream to the vertebrobasilar junction. Dense calcified plaque of the right proximal internal carotid artery with long segment of severe stenosis. No dissection or occlusion. Left carotid system: Predominant fibrofatty plaque of the common carotid artery with mild less than 50% stenosis. Dense calcified plaque of the left carotid bifurcation and proximal left internal carotid artery with moderate 60% proximal ICA stenosis. Vertebral arteries: Diminutive right vertebral artery. Calcified plaque of the right vertebral artery origin with severe stenosis. There is a long segment of non opacification the right vertebral artery the V2 segment and gradient attenuation of the upper vertebral artery in the neck from the vertebrobasilar junction, probably representing reversal of flow. Widely patent dominant left vertebral artery. Skeleton: C4-5 grade 1 anterolisthesis and mild reversal of curvature at C5-6. Moderate to severe discogenic degenerative changes at C5-C7. Prominent multilevel cervical facet arthrosis. Other neck: Left upper lobe spiculated nodule measuring 15 x 14 mm (series 9, image 169). Right upper lobe necrotic masslike lesion containing fluid level measuring 3.3 x 6.9 x 5.3 cm (AP x ML x CC series 9, image  162 and series 12, image 128). Upper chest: 8 x 11 mm nodule within the parotid gland and superficial deep junction (series 11, image 182). Review of the MIP images confirms the above findings CTA HEAD FINDINGS Anterior circulation: Calcific atherosclerosis of the carotid siphons. Mild-to-moderate left cavernous and mild bilateral paraclinoid ICA stenosis. Patent bilateral ACA and MCA without high-grade stenosis or aneurysm. Mild distal left M1 stenosis. Posterior circulation: No high-grade stenosis, proximal occlusion, aneurysm, or vascular malformation. Moderate distal left P2 and mild right P1 stenosis. Venous sinuses: As permitted by contrast timing, patent. Anatomic variants: None significant. Delayed phase: No abnormal intracranial enhancement. Review of the MIP images confirms the above findings IMPRESSION: CT head: 1. Stable acute/early subacute stroke within the right caudate body. No new acute intracranial abnormality identified. 2. Stable chronic microvascular ischemic changes and volume loss of the brain. CTA neck: 1. 4.1 cm ascending aortic aneurysm. Recommend annual imaging followup by CTA or MRA. This  recommendation follows 2010 ACCF/AHA/AATS/ACR/ASA/SCA/SCAI/SIR/STS/SVM Guidelines for the Diagnosis and Management of Patients with Thoracic Aortic Disease. Circulation. 2010; 121: D176-H607. 2. Severe stenosis of the right subclavian artery origin. 3. Tandem segments of severe stenosis of right common carotid artery and right proximal internal carotid artery. 4. Mild left common carotid artery and moderate left internal carotid artery stenosis. 5. Absent enhancement within the mid right vertebral artery and gradient enhancement descending from the vertebrobasilar junction probably representing retrograde flow within the right vertebral artery. 6. Widely patent left vertebral artery. 7. Necrotic masslike opacity with fluid level in the right upper lobe measuring up to 6.9 cm. Findings may represent  necrotic pneumonia, old cavitary scarring, or malignancy. 8. 15 mm spiculated nodule in left upper lobe. 9. 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 Parotid neoplasms are more commonly benign, however malignant parotid neoplasms can have a similar appearance. CTA head: 1. No large vessel occlusion, aneurysm, high-grade stenosis, or vascular malformation. 2. Intracranial atherosclerosis with multiple areas of mild-to-moderate stenosis. Electronically Signed   By: Kristine Garbe M.D.   On: 05/29/2018 04:48   Dg Chest 2 View  Result Date: 05/27/2018 CLINICAL DATA:  Stroke-like symptoms EXAM: CHEST - 2 VIEW COMPARISON:  10/19/2014 FINDINGS: Linear atelectasis or scar in the right upper lobe. Hyperinflated lungs. No focal opacity or pleural effusion. Normal cardiomediastinal silhouette with aortic atherosclerosis. Emphysematous changes. Multiple mild lower thoracic compression deformities. IMPRESSION: Hyperinflation with emphysematous changes and atelectasis or scarring in the right upper lobe. Electronically Signed   By: Donavan Foil M.D.   On: 05/27/2018 00:49   Ct Angio Neck W Or Wo Contrast  Result Date: 05/29/2018 CLINICAL DATA:  84 y/o  M; stroke for follow-up. EXAM: CT ANGIOGRAPHY HEAD AND NECK TECHNIQUE: Multidetector CT imaging of the head and neck was performed using the standard protocol during bolus administration of intravenous contrast. Multiplanar CT image reconstructions and MIPs were obtained to evaluate the vascular anatomy. Carotid stenosis measurements (when applicable) are obtained utilizing NASCET criteria, using the distal internal carotid diameter as the denominator. CONTRAST:  50 cc Isovue 370 COMPARISON:  05/26/2018 CT head. 05/27/2018 MRI and MRA head. 05/27/2018 carotid ultrasound. FINDINGS: CT HEAD FINDINGS Brain: Hypoattenuation within the right caudate body corresponding to acute infarction on prior MRI of the head. No new  acute stroke, hemorrhage, focal mass effect, hydrocephalus, extra-axial collection, or herniation. Stable chronic microvascular ischemic changes and volume loss of the brain. Vascular: Calcific atherosclerosis of carotid siphons and vertebral arteries. Skull: Normal. Negative for fracture or focal lesion. Sinuses: Mild frontal sinus and left posterior ethmoid sinus mucosal thickening. Visualized paranasal sinuses are otherwise normally aerated. Normal aeration of mastoid air cells. Orbits: Bilateral intra-ocular lens replacement. Review of the MIP images confirms the above findings CTA NECK FINDINGS Aortic arch: Ascending aortic aneurysm measuring 4.1 cm. Moderate calcific atherosclerosis of the aorta. Right subclavian artery origin predominantly calcified plaque with severe 90% stenosis. Right carotid system: Right common carotid artery mixed plaque with severe stenosis just upstream to the vertebrobasilar junction. Dense calcified plaque of the right proximal internal carotid artery with long segment of severe stenosis. No dissection or occlusion. Left carotid system: Predominant fibrofatty plaque of the common carotid artery with mild less than 50% stenosis. Dense calcified plaque of the left carotid bifurcation and proximal left internal carotid artery with moderate 60% proximal ICA stenosis. Vertebral arteries: Diminutive right vertebral artery. Calcified plaque of the right vertebral artery origin with severe stenosis. There is  a long segment of non opacification the right vertebral artery the V2 segment and gradient attenuation of the upper vertebral artery in the neck from the vertebrobasilar junction, probably representing reversal of flow. Widely patent dominant left vertebral artery. Skeleton: C4-5 grade 1 anterolisthesis and mild reversal of curvature at C5-6. Moderate to severe discogenic degenerative changes at C5-C7. Prominent multilevel cervical facet arthrosis. Other neck: Left upper lobe spiculated  nodule measuring 15 x 14 mm (series 9, image 169). Right upper lobe necrotic masslike lesion containing fluid level measuring 3.3 x 6.9 x 5.3 cm (AP x ML x CC series 9, image 162 and series 12, image 128). Upper chest: 8 x 11 mm nodule within the parotid gland and superficial deep junction (series 11, image 182). Review of the MIP images confirms the above findings CTA HEAD FINDINGS Anterior circulation: Calcific atherosclerosis of the carotid siphons. Mild-to-moderate left cavernous and mild bilateral paraclinoid ICA stenosis. Patent bilateral ACA and MCA without high-grade stenosis or aneurysm. Mild distal left M1 stenosis. Posterior circulation: No high-grade stenosis, proximal occlusion, aneurysm, or vascular malformation. Moderate distal left P2 and mild right P1 stenosis. Venous sinuses: As permitted by contrast timing, patent. Anatomic variants: None significant. Delayed phase: No abnormal intracranial enhancement. Review of the MIP images confirms the above findings IMPRESSION: CT head: 1. Stable acute/early subacute stroke within the right caudate body. No new acute intracranial abnormality identified. 2. Stable chronic microvascular ischemic changes and volume loss of the brain. CTA neck: 1. 4.1 cm ascending aortic aneurysm. Recommend annual imaging followup by CTA or MRA. This recommendation follows 2010 ACCF/AHA/AATS/ACR/ASA/SCA/SCAI/SIR/STS/SVM Guidelines for the Diagnosis and Management of Patients with Thoracic Aortic Disease. Circulation. 2010; 121: Z610-R604. 2. Severe stenosis of the right subclavian artery origin. 3. Tandem segments of severe stenosis of right common carotid artery and right proximal internal carotid artery. 4. Mild left common carotid artery and moderate left internal carotid artery stenosis. 5. Absent enhancement within the mid right vertebral artery and gradient enhancement descending from the vertebrobasilar junction probably representing retrograde flow within the right  vertebral artery. 6. Widely patent left vertebral artery. 7. Necrotic masslike opacity with fluid level in the right upper lobe measuring up to 6.9 cm. Findings may represent necrotic pneumonia, old cavitary scarring, or malignancy. 8. 15 mm spiculated nodule in left upper lobe. 9. 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 Parotid neoplasms are more commonly benign, however malignant parotid neoplasms can have a similar appearance. CTA head: 1. No large vessel occlusion, aneurysm, high-grade stenosis, or vascular malformation. 2. Intracranial atherosclerosis with multiple areas of mild-to-moderate stenosis. Electronically Signed   By: Kristine Garbe M.D.   On: 05/29/2018 04:48   Ct Chest Wo Contrast  Result Date: 05/29/2018 CLINICAL DATA:  Possible lung mass. Chronic obstructive pulmonary disease. EXAM: CT CHEST WITHOUT CONTRAST TECHNIQUE: Multidetector CT imaging of the chest was performed following the standard protocol without IV contrast. COMPARISON:  Radiographs of May 27, 2018. FINDINGS: Cardiovascular: 4.1 cm ascending thoracic aortic aneurysm is noted. Atherosclerosis of thoracic aorta is noted. Coronary artery calcifications are noted. Normal cardiac size. No pericardial effusion is noted. Mediastinum/Nodes: No enlarged mediastinal or axillary lymph nodes. Thyroid gland, trachea, and esophagus demonstrate no significant findings. Lungs/Pleura: No pneumothorax or pleural effusion is noted. Hyperexpansion of the lungs is noted. 4.6 x 3.1 cm cavitating abnormality is noted in right upper lobe which may represent pneumonia or malignancy. Also noted is 17 x 11 mm spiculated density laterally in the  left upper lobe with pleural tail, highly concerning for malignancy. Emphysematous disease is noted in both upper lobes. Upper Abdomen: No acute abnormality. Musculoskeletal: No chest wall mass or suspicious bone lesions identified. IMPRESSION: 4.6 x 3.1 cm  cavitating lesion is noted in right upper lobe concerning for cavitating pneumonia or necrotic malignancy. Clinical correlation is recommended. 1.7 cm spiculated density is noted laterally in left upper lobe with pleural tail, highly concerning for malignancy. Coronary artery calcifications are noted suggesting coronary artery disease. 4.1 cm ascending thoracic aortic aneurysm is noted. Recommend annual imaging followup by CTA or MRA. This recommendation follows 2010 ACCF/AHA/AATS/ACR/ASA/SCA/SCAI/SIR/STS/SVM Guidelines for the Diagnosis and Management of Patients with Thoracic Aortic Disease. Circulation. 2010; 121: B151-V616. Aortic Atherosclerosis (ICD10-I70.0) and Emphysema (ICD10-J43.9). Electronically Signed   By: Marijo Conception, M.D.   On: 05/29/2018 19:37   Mr Brain Wo Contrast  Result Date: 05/27/2018 CLINICAL DATA:  Focal neuro deficit less than 6 hours. Suspect stroke. Left-sided weakness. EXAM: MRI HEAD WITHOUT CONTRAST MRA HEAD WITHOUT CONTRAST TECHNIQUE: Multiplanar, multiecho pulse sequences of the brain and surrounding structures were obtained without intravenous contrast. Angiographic images of the head were obtained using MRA technique without contrast. COMPARISON:  CT head 05/26/2018 FINDINGS: MRI HEAD FINDINGS Brain: Acute infarct right lenticular nucleus involving the body of the caudate and deep white matter tracts. No other acute infarct. Moderate atrophy. Chronic microvascular ischemic changes in the white matter. Negative for hemorrhage or mass. Vascular: Normal arterial flow voids Skull and upper cervical spine: Negative Sinuses/Orbits: Bilateral cataract surgery. Paranasal sinuses clear. Other: None MRA HEAD FINDINGS Left vertebral artery widely patent and supplies the basilar. Distal right vertebral artery not visualized. PICA patent on the left. Superior cerebellar artery patent bilaterally. Posterior cerebral arteries have decreased signal distally which may be artifactual or due  to proximal stenosis. Internal carotid artery patent bilaterally. Mild atherosclerotic stenosis in the cavernous carotid bilaterally. Anterior cerebral arteries patent bilaterally. Decreased signal in the middle cerebral artery branches bilaterally which may be due to severe M1 segment stenosis bilaterally versus artifact. IMPRESSION: Acute infarct right lenticular nucleus. Moderate atrophy and chronic microvascular ischemia. Occluded right vertebral artery. Decreased signal in the posterior cerebral arteries bilaterally which may be due to proximal severe stenosis. Decreased signal in the middle cerebral arteries bilaterally likely due to atherosclerotic stenosis at the bifurcation bilaterally. These results will be called to the ordering clinician or representative by the Radiologist Assistant, and communication documented in the PACS or zVision Dashboard. Electronically Signed   By: Franchot Gallo M.D.   On: 05/27/2018 08:11   US Carotid Bilateral (at [facility] And [facility] Only)  Result Date: 05/27/2018 CLINICAL DATA:  Stroke EXAM: BILATERAL CAROTID DUPLEX ULTRASOUND TECHNIQUE: Pearline Cables scale imaging, color Doppler and duplex ultrasound were performed of bilateral carotid and vertebral arteries in the neck. COMPARISON:  None. FINDINGS: Criteria: Quantification of carotid stenosis is based on velocity parameters that correlate the residual internal carotid diameter with NASCET-based stenosis levels, using the diameter of the distal internal carotid lumen as the denominator for stenosis measurement. The following velocity measurements were obtained: RIGHT ICA: 410 cm/sec CCA: 75 cm/sec SYSTOLIC ICA/CCA RATIO:  5.5 ECA: 218 cm/sec LEFT ICA: 39 cm/sec CCA: 073 cm/sec SYSTOLIC ICA/CCA RATIO:  0.7 ECA: 153 cm/sec RIGHT CAROTID ARTERY: Focal calcified plaque in the lower common carotid artery. Scattered calcified plaque along the wall of the mid and upper common carotid artery. Advanced calcified plaque in the bulb with  shadowing of the lumen. Low resistance internal  carotid Doppler pattern is preserved. Calcified plaque extends into the lower internal carotid artery. RIGHT VERTEBRAL ARTERY:  Nonvisualized LEFT CAROTID ARTERY: There is extensive irregular calcified plaque in the lower common carotid artery. There is prominent smooth mixed plaque in the mid and upper common carotid artery. There is advanced calcified plaque in the bulb with shadowing of the lumen. LEFT VERTEBRAL ARTERY:  Antegrade. IMPRESSION: Greater than 70% stenosis in the right internal carotid artery. 50-69% stenosis in the left internal carotid artery. Right vertebral artery was nonvisualized. Left vertebral artery is antegrade in flow. There is extensive plaque throughout both common carotid arteries and bulbs as described above. Electronically Signed   By: Marybelle Killings M.D.   On: 05/27/2018 08:57   Mr Jodene Nam Head/brain GY Cm  Result Date: 05/27/2018 CLINICAL DATA:  Focal neuro deficit less than 6 hours. Suspect stroke. Left-sided weakness. EXAM: MRI HEAD WITHOUT CONTRAST MRA HEAD WITHOUT CONTRAST TECHNIQUE: Multiplanar, multiecho pulse sequences of the brain and surrounding structures were obtained without intravenous contrast. Angiographic images of the head were obtained using MRA technique without contrast. COMPARISON:  CT head 05/26/2018 FINDINGS: MRI HEAD FINDINGS Brain: Acute infarct right lenticular nucleus involving the body of the caudate and deep white matter tracts. No other acute infarct. Moderate atrophy. Chronic microvascular ischemic changes in the white matter. Negative for hemorrhage or mass. Vascular: Normal arterial flow voids Skull and upper cervical spine: Negative Sinuses/Orbits: Bilateral cataract surgery. Paranasal sinuses clear. Other: None MRA HEAD FINDINGS Left vertebral artery widely patent and supplies the basilar. Distal right vertebral artery not visualized. PICA patent on the left. Superior cerebellar artery patent  bilaterally. Posterior cerebral arteries have decreased signal distally which may be artifactual or due to proximal stenosis. Internal carotid artery patent bilaterally. Mild atherosclerotic stenosis in the cavernous carotid bilaterally. Anterior cerebral arteries patent bilaterally. Decreased signal in the middle cerebral artery branches bilaterally which may be due to severe M1 segment stenosis bilaterally versus artifact. IMPRESSION: Acute infarct right lenticular nucleus. Moderate atrophy and chronic microvascular ischemia. Occluded right vertebral artery. Decreased signal in the posterior cerebral arteries bilaterally which may be due to proximal severe stenosis. Decreased signal in the middle cerebral arteries bilaterally likely due to atherosclerotic stenosis at the bifurcation bilaterally. These results will be called to the ordering clinician or representative by the Radiologist Assistant, and communication documented in the PACS or zVision Dashboard. Electronically Signed   By: Franchot Gallo M.D.   On: 05/27/2018 08:11   Ct Head Code Stroke Wo Contrast  Result Date: 05/26/2018 CLINICAL DATA:  Code stroke.  Left leg weakness EXAM: CT HEAD WITHOUT CONTRAST TECHNIQUE: Contiguous axial images were obtained from the base of the skull through the vertex without intravenous contrast. COMPARISON:  None. FINDINGS: Brain: There is no mass, hemorrhage or extra-axial collection. There is generalized atrophy without lobar predilection. There is no acute or chronic infarction. There is hypoattenuation of the periventricular white matter, most commonly indicating chronic ischemic microangiopathy. Vascular: No abnormal hyperdensity of the major intracranial arteries or dural venous sinuses. No intracranial atherosclerosis. Skull: The visualized skull base, calvarium and extracranial soft tissues are normal. Sinuses/Orbits: No fluid levels or advanced mucosal thickening of the visualized paranasal sinuses. No mastoid  or middle ear effusion. The orbits are normal. ASPECTS [facility] Stroke Program Early CT Score) - Ganglionic level infarction (caudate, lentiform nuclei, internal capsule, insula, M1-M3 cortex): 7 - Supraganglionic infarction (M4-M6 cortex): 3 Total score (0-10 with 10 being normal): 10 IMPRESSION: 1. No intracranial hemorrhage. 2.  ASPECTS is 10. These results were called by telephone at the time of interpretation on 05/26/2018 at 10:49 pm to Dr. Nat Christen , who verbally acknowledged these results. Electronically Signed   By: Ulyses Jarred M.D.   On: 05/26/2018 22:50     LOS: 2 days   Oren Binet, MD  [facility]  If 7PM-7AM, please contact night-coverage  Please page via [email or web address]-click on MD name and type text message  05/30/2018, 9:34 AM

## 2018-05-30 NOTE — Progress Notes (Signed)
STROKE TEAM PROGRESS NOTE   SUBJECTIVE (INTERVAL HISTORY) His son is at the bedside.  Patient lying in bed, no complains. CT chest concerning for cancer and oncology will be called as per son.  Patient is willing to quit smoking. Right CEA scheduled next Wednesday, pending cardiology clearance.    OBJECTIVE Vitals:   05/29/18 2349 05/30/18 0324 05/30/18 0746 05/30/18 0751  BP: (!) 155/86 (!) 179/98  (!) 155/101  Pulse: 75 63  68  Resp: 18 18  18   Temp: 98 F (36.7 C) 98.1 F (36.7 C)  98 F (36.7 C)  TempSrc: Oral Oral  Oral  SpO2: 98% 97% 96% 96%  Weight:      Height:        CBC:  Recent Labs  Lab 05/26/18 2240 05/26/18 2246 05/28/18 0415  WBC 10.3  --  8.5  NEUTROABS 8.2*  --   --   HGB 14.9 16.7 13.7  HCT 45.7 49.0 43.5  MCV 91.0  --  91.6  PLT 237  --  536    Basic Metabolic Panel:  Recent Labs  Lab 05/27/18 2010 05/28/18 0415 05/29/18 0722  NA 134* 133* 134*  K 3.7 3.3* 3.7  CL 103 103 102  CO2 19* 19* 23  GLUCOSE 116* 104* 101*  BUN 14 13 19   CREATININE 1.15 1.12 1.26*  CALCIUM 9.5 9.5 9.1  MG 2.0  --   --     Lipid Panel:     Component Value Date/Time   CHOL 129 05/27/2018 0559   TRIG 63 05/27/2018 0559   HDL 60 05/27/2018 0559   CHOLHDL 2.2 05/27/2018 0559   VLDL 13 05/27/2018 0559   LDLCALC 56 05/27/2018 0559   HgbA1c:  Lab Results  Component Value Date   HGBA1C 5.2 05/26/2018   Urine Drug Screen:     Component Value Date/Time   LABOPIA NONE DETECTED 05/26/2018 2231   COCAINSCRNUR NONE DETECTED 05/26/2018 2231   LABBENZ NONE DETECTED 05/26/2018 2231   AMPHETMU NONE DETECTED 05/26/2018 2231   THCU NONE DETECTED 05/26/2018 2231   LABBARB NONE DETECTED 05/26/2018 2231    Alcohol Level     Component Value Date/Time   ETH <10 05/26/2018 2240    IMAGING  CT Chest WO Contrast 05/29/2018 IMPRESSION:  4.6 x 3.1 cm cavitating lesion is noted in right upper lobe concerning for cavitating pneumonia or necrotic malignancy. Clinical  correlation is recommended.  1.7 cm spiculated density is noted laterally in left upper lobe with pleural tail, highly concerning for malignancy.  Coronary artery calcifications are noted suggesting coronary artery disease.  4.1 cm ascending thoracic aortic aneurysm is noted. Recommend annual imaging followup by CTA or MRA. This recommendation follows 2010 ACCF/AHA/AATS/ACR/ASA/SCA/SCAI/SIR/STS/SVM Guidelines for the Diagnosis and Management of Patients with Thoracic Aortic Disease. Circulation. 2010; 121: I680-H212.  Aortic Atherosclerosis (ICD10-I70.0) and Emphysema (ICD10-J43.9).   Ct Angio Head W Or Wo ContrastCt Angio Neck W Or Wo Contrast 05/29/2018 IMPRESSION:   CT head:  1. Stable acute/early subacute stroke within the right caudate body. No new acute intracranial abnormality identified.  2. Stable chronic microvascular ischemic changes and volume loss of the brain.   CTA neck:  1. 4.1 cm ascending aortic aneurysm. Recommend annual imaging followup by CTA or MRA. This recommendation follows 2010 ACCF/AHA/AATS/ACR/ASA/SCA/SCAI/SIR/STS/SVM Guidelines for the Diagnosis and Management of Patients with Thoracic Aortic Disease. Circulation. 2010; 121: Y482-N003.  2. Severe stenosis of the right subclavian artery origin.  3. Tandem segments of severe stenosis of  right common carotid artery and right proximal internal carotid artery.  4. Mild left common carotid artery and moderate left internal carotid artery stenosis.  5. Absent enhancement within the mid right vertebral artery and gradient enhancement descending from the vertebrobasilar junction probably representing retrograde flow within the right vertebral artery.  6. Widely patent left vertebral artery.  7. Necrotic masslike opacity with fluid level in the right upper lobe measuring up to 6.9 cm. Findings may represent necrotic pneumonia, old cavitary scarring, or malignancy.  8. 15 mm spiculated nodule in left upper lobe.  9. 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 Parotid neoplasms are more commonly benign, however malignant parotid neoplasms can have a similar appearance.   CTA head:  1. No large vessel occlusion, aneurysm, high-grade stenosis, or vascular malformation.  2. Intracranial atherosclerosis with multiple areas of mild-to-moderate stenosi   US Carotid Bilateral (at [facility] Only) 05/27/2018 IMPRESSION:  Greater than 70% stenosis in the right internal carotid artery. 50-69% stenosis in the left internal carotid artery. Right vertebral artery was nonvisualized. Left vertebral artery is antegrade in flow. There is extensive plaque throughout both common carotid arteries and bulbs as described above.    MRI / MRA Brain 05/27/2018 IMPRESSION: Acute infarct right lenticular nucleus. Moderate atrophy and chronic microvascular ischemia. Occluded right vertebral artery.  Decreased signal in the posterior cerebral arteries bilaterally which may be due to proximal severe stenosis.  Decreased signal in the middle cerebral arteries bilaterally likely due to atherosclerotic stenosis at the bifurcation bilaterally.    Transthoracic Echocardiogram - Technically difficult study. Images were off axis and mainly   subcostal. LVEF 40-45%, moderate LVH, global hypokinesis, grade 1   DD, elevated LV filling pressure, calcified aortic valve without   significant stenosis, trivial AI, trivial MR, normal LA size,   normal IVC.    PHYSICAL EXAM Temp:  [97.5 F (36.4 C)-98.1 F (36.7 C)] 98 F (36.7 C) (10/20 0751) Pulse Rate:  [63-75] 68 (10/20 0751) Resp:  [18-20] 18 (10/20 0751) BP: (146-187)/(86-103) 155/101 (10/20 0751) SpO2:  [96 %-98 %] 96 % (10/20 0751)  General - Well nourished, well developed, in no apparent distress.  Ophthalmologic - fundi not visualized due to noncooperation.  Cardiovascular - irregular heart rhythm with frequent PVCs on  tele.  Mental Status -  Level of arousal and orientation to time, place, and person were intact. Language including expression, naming, repetition, comprehension was assessed and found intact.  Cranial Nerves II - XII - II - Visual field intact OU. III, IV, VI - Extraocular movements intact. V - Facial sensation intact bilaterally. VII - Facial movement intact bilaterally. VIII - Hearing & vestibular intact bilaterally. X - Palate elevates symmetrically. XI - Chin turning & shoulder shrug intact bilaterally. XII - Tongue protrusion intact.  Motor Strength - The patient's strength was normal in all extremities and pronator drift was absent.  Bulk was normal and fasciculations were absent.   Motor Tone - Muscle tone was assessed at the neck and appendages and was normal.  Reflexes - The patient's reflexes were symmetrical in all extremities and he had no pathological reflexes.  Sensory - Light touch, temperature/pinprick were assessed and were symmetrical.    Coordination - The patient had normal movements in the hands and feet with no ataxia or dysmetria.  Tremor was absent.  Gait and Station - deferred.   ASSESSMENT/PLAN [name] is a 84 y.o. male with history  of tobacco use, COPD and hypertension presenting with slurred speech,left arm and leg weakness and facial drooping. He did not receive IV t-PA due to late presentation.   Stroke: Acute infarct right lenticular nucleus - artery to artery embolic likely due to RT ICA severe stenosis  CT head no acute abnormality  MRI head - Acute infarct right lenticular nucleus  MRA head - Occluded right vertebral artery. Decreased signal in the posterior cerebral arteries bilaterally which may be due to proximal severe stenosis.   CTA H&N - Severe stenosis of the right subclavian artery origin. Tandem segments severe stenosis of right common carotid artery and right proximal internal carotid artery.  Absent enhancement within  the mid right vertebral artery   Carotid Doppler - Greater than 70% stenosis in the right internal carotid artery. 50-69% stenosis in the left internal carotid artery. Right vertebral artery was nonvisualized.  2D Echo - EF 40 to 45%  LDL - 56  HgbA1c - 5.2  VTE prophylaxis - Lovenox  Diet - Regular  No antithrombotic prior to admission, now on aspirin 325 mg daily.  Continue aspirin for now.  Will not put on Plavix given potential lung biopsy procedure.  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   Tobacco abuse  Current smoker  Smoking cessation counseling provided  Pt is willing to quit  Hypertension  BP high but within parameters . Long-term BP goal 130-160 before CEA . 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  4.1 cm aortic ascending aneurysm  Hypokalemia - supplemented   Hospital day # 2  Neurology will sign off. Please call with questions. Pt will follow up with stroke clinic NP at [facility] in about 4 weeks. Thanks for the consult.  Rosalin Hawking, MD PhD Stroke Neurology 05/30/2018 12:03 PM   To contact Stroke Continuity provider, please refer to [email or web address] After hours, contact General Neurology

## 2018-05-30 NOTE — Consult Note (Addendum)
Cardiology Consultation:   Patient ID: [name] MRN: [id number]; DOB: [birth year]  Admit date: 05/26/2018 Date of Consult: 05/30/2018  Primary Care Provider: Fran Lowes, MD Primary Cardiologist: New patient   Patient Profile:   [name] is a 84 y.o. male with a hx of COPD, hypertension, stroke who is being seen today for the preoperative cardiovascular evaluation at the request of Dr. Harold Barban.  History of Present Illness:   Mr. [name] is a 84 y.o. male with history of COPD, hypertension, no prior cardiac history who presented with slurred speech and left-sided weakness to [facility], further work-up revealed a acute CVA involving the right lenticular nucleus.  Further work-up demonstrated significant stenosis in the right internal carotid artery-patient was transferred to [facility] for stroke MD/vascular surgery evaluation.    His stroke is considered to be likely thromboembolic in the setting of severe right ICA stenosis, non-focal-neurological deficits have improved. CTA neck shows tandem segments of severe stenosis in the right common carotid artery and right proximal internal carotid artery.  Transthoracic echo shows EF around 40 to 45%.    He was started on high dose of aspirin 300 mg daily and atorvastatin 40 mg daily.  Vascular surgery planning on right CEA this Wednesday.  Needs preop cardiology evaluation-they have been consulted today.  If no further work-up planned by cardiology-he could be potentially discharged and brought back as an outpatient for carotid endarterectomy.    Patient is an ongoing smoker. His chest CT also revealed ascending aortic aneurysm with maximum diameter 41 mm and right upper lobe lung necrotic mass: Incidentally found on imaging done for work-up of carotid stenosis-does not have any signs of pneumonia-therefore think this is a primary lung malignancy, plan is to see outpatient oncology -probably a PET scan after carotid  endarterectomy has been completed.  The patient has no prior cardiac history, he is a lifelong smoker  Since his teenage years. He lives with his son and is able to perform all ADL including house work. He has no limitation and denies any SOB, chest pain, orthopnea, PND, no LE edema. He can certainly walk at least 1 block.  Past Medical History:  Diagnosis Date  . COPD (chronic obstructive pulmonary disease) ([place])   . Hypertension    Past Surgical History:  Procedure Laterality Date  . BACK SURGERY    . COLONOSCOPY     polyps removed   Inpatient Medications: Scheduled Meds: .  stroke: mapping our early stages of recovery book   Does not apply Once  . aspirin  300 mg Rectal Daily   Or  . aspirin  325 mg Oral Daily  . atorvastatin  40 mg Oral q1800  . carvedilol  12.5 mg Oral BID  . enoxaparin (LOVENOX) injection  40 mg Subcutaneous Q24H  . mometasone-formoterol  2 puff Inhalation BID  . nicotine  21 mg Transdermal Daily  . pantoprazole  40 mg Oral Q1200   Continuous Infusions: . sodium chloride 50 mL/hr at 05/27/18 1511   PRN Meds: albuterol, gi cocktail, hydrALAZINE, senna-docusate  Allergies:   No Known Allergies  Social History:   Social History   Socioeconomic History  . Marital status: Widowed    Spouse name: Not on file  . Number of children: Not on file  . Years of education: Not on file  . Highest education level: Not on file  Occupational History  . Not on file  Social Needs  . Financial resource strain: Not on  file  . Food insecurity:    Worry: Not on file    Inability: Not on file  . Transportation needs:    Medical: Not on file    Non-medical: Not on file  Tobacco Use  . Smoking status: Current Every Day Smoker    Packs/day: 1.00    Types: Cigarettes  . Smokeless tobacco: Never Used  Substance and Sexual Activity  . Alcohol use: No  . Drug use: No  . Sexual activity: Not on file  Lifestyle  . Physical activity:    Days per week: Not on file     Minutes per session: Not on file  . Stress: Not on file  Relationships  . Social connections:    Talks on phone: Not on file    Gets together: Not on file    Attends religious service: Not on file    Active member of club or organization: Not on file    Attends meetings of clubs or organizations: Not on file    Relationship status: Not on file  . Intimate partner violence:    Fear of current or ex partner: Not on file    Emotionally abused: Not on file    Physically abused: Not on file    Forced sexual activity: Not on file  Other Topics Concern  . Not on file  Social History Narrative  . Not on file    Family History:    Family History  Family history unknown: Yes    ROS:  Please see the history of present illness.  All other ROS reviewed and negative.     Physical Exam/Data:   Vitals:   05/30/18 0324 05/30/18 0746 05/30/18 0751 05/30/18 1141  BP: (!) 179/98  (!) 155/101 (!) 162/79  Pulse: 63  68 (!) 57  Resp: 18  18 16   Temp: 98.1 F (36.7 C)  98 F (36.7 C) 98.3 F (36.8 C)  TempSrc: Oral  Oral Oral  SpO2: 97% 96% 96% 99%  Weight:      Height:        Intake/Output Summary (Last 24 hours) at 05/30/2018 1427 Last data filed at 05/30/2018 0858 Gross per 24 hour  Intake 360 ml  Output -  Net 360 ml   Filed Weights   05/26/18 2249 05/28/18 2048  Weight: 58.1 kg 56.9 kg   Body mass index is 17.5 kg/m.  General:  Cachectic appearing pleasant male HEENT: normal Lymph: no adenopathy Neck: no JVD Endocrine:  No thryomegaly Vascular: No carotid bruits; FA pulses 2+ bilaterally without bruits  Cardiac:  normal S1, S2; RRR; no murmur  Lungs:  Wheezing B/L, rhonchi or rales  Abd: soft, nontender, no hepatomegaly  Ext: no edema Musculoskeletal:  No deformities,  Psych:  Normal affect   EKG:  The EKG was personally reviewed and demonstrates: Sinus rhythm, PVCs, negative T waves inferiorly suggestive of ischemia, no prior EKGs available for comparison,  this was personally reviewed. Telemetry:  Telemetry was personally reviewed and demonstrates:  SR  Relevant CV Studies: TTE: 05/28/2018 - Left ventricle: The cavity size was normal. Wall thickness was   increased in a pattern of moderate LVH. There was moderate   concentric hypertrophy. Systolic function was mildly to   moderately reduced. The estimated ejection fraction was in the   range of 55-60%. Doppler parameters are   consistent with abnormal left ventricular relaxation (grade 1   diastolic dysfunction). The E/e&' ratio is >15, suggesting   elevated LV filling  pressure. - Aortic valve: There was trivial regurgitation. - Mitral valve: Mildly thickened leaflets . There was trivial   regurgitation. - Left atrium: The atrium was normal in size. - Inferior vena cava: The vessel was normal in size. The   respirophasic diameter changes were in the normal range (>= 50%),   consistent with normal central venous pressure.  Laboratory Data:  Chemistry Recent Labs  Lab 05/27/18 2010 05/28/18 0415 05/29/18 0722  NA 134* 133* 134*  K 3.7 3.3* 3.7  CL 103 103 102  CO2 19* 19* 23  GLUCOSE 116* 104* 101*  BUN 14 13 19   CREATININE 1.15 1.12 1.26*  CALCIUM 9.5 9.5 9.1  GFRNONAA 57* 59* 51*  GFRAA >60 >60 59*  ANIONGAP 12 11 9     Recent Labs  Lab 05/26/18 2240 05/27/18 2010  PROT 7.7 6.7  ALBUMIN 4.4 3.8  AST 18 22  ALT 11 10  ALKPHOS 83 73  BILITOT 0.6 0.7   Hematology Recent Labs  Lab 05/26/18 2240 05/26/18 2246 05/28/18 0415  WBC 10.3  --  8.5  RBC 5.02  --  4.75  HGB 14.9 16.7 13.7  HCT 45.7 49.0 43.5  MCV 91.0  --  91.6  MCH 29.7  --  28.8  MCHC 32.6  --  31.5  RDW 14.6  --  14.6  PLT 237  --  268   Cardiac EnzymesNo results for input(s): TROPONINI in the last 168 hours.  Recent Labs  Lab 05/26/18 2244  TROPIPOC 0.01    BNPNo results for input(s): BNP, PROBNP in the last 168 hours.  DDimer No results for input(s): DDIMER in the last 168  hours.  Radiology/Studies:   Dg Chest 2 View  Result Date: 05/27/2018 CLINICAL DATA:  Stroke-like symptoms EXAM: CHEST - 2 VIEW COMPARISON:  10/19/2014 FINDINGS: Linear atelectasis or scar in the right upper lobe. Hyperinflated lungs. No focal opacity or pleural effusion. Normal cardiomediastinal silhouette with aortic atherosclerosis. Emphysematous changes. Multiple mild lower thoracic compression deformities. IMPRESSION: Hyperinflation with emphysematous changes and atelectasis or scarring in the right upper lobe. Electronically Signed   By: Donavan Foil M.D.   On: 05/27/2018 00:49   Ct Chest Wo Contrast  Result Date: 05/29/2018 IMPRESSION: 4.6 x 3.1 cm cavitating lesion is noted in right upper lobe concerning for cavitating pneumonia or necrotic malignancy. Clinical correlation is recommended. 1.7 cm spiculated density is noted laterally in left upper lobe with pleural tail, highly concerning for malignancy. Coronary artery calcifications are noted suggesting coronary artery disease. 4.1 cm ascending thoracic aortic aneurysm is noted. Recommend annual imaging followup by CTA or MRA. This recommendation follows 2010 ACCF/AHA/AATS/ACR/ASA/SCA/SCAI/SIR/STS/SVM Guidelines for the Diagnosis and Management of Patients with Thoracic Aortic Disease. Circulation. 2010; 121: B638-L373. Aortic Atherosclerosis (ICD10-I70.0) and Emphysema (ICD10-J43.9). Electronically Signed   By: Marijo Conception, M.D.   On: 05/29/2018 19:37   Mr Brain Wo Contrast  Result Date: 05/27/2018  IMPRESSION: Acute infarct right lenticular nucleus. Moderate atrophy and chronic microvascular ischemia. Occluded right vertebral artery. Decreased signal in the posterior cerebral arteries bilaterally which may be due to proximal severe stenosis. Decreased signal in the middle cerebral arteries bilaterally likely due to atherosclerotic stenosis at the bifurcation bilaterally. These results will be called to the ordering clinician or  representative by the Radiologist Assistant, and communication documented in the PACS or zVision Dashboard. Electronically Signed   By: Franchot Gallo M.D.   On: 05/27/2018 08:11   US Carotid Bilateral (at [place]  Only)  Result Date: 05/27/2018  IMPRESSION: Greater than 70% stenosis in the right internal carotid artery. 50-69% stenosis in the left internal carotid artery. Right vertebral artery was nonvisualized. Left vertebral artery is antegrade in flow. There is extensive plaque throughout both common carotid arteries and bulbs as described above. Electronically Signed   By: Marybelle Killings M.D.   On: 05/27/2018 08:57   Mr Jodene Nam Head/brain YY Cm  Result Date: 05/27/2018  IMPRESSION: Acute infarct right lenticular nucleus. Moderate atrophy and chronic microvascular ischemia. Occluded right vertebral artery. Decreased signal in the posterior cerebral arteries bilaterally which may be due to proximal severe stenosis. Decreased signal in the middle cerebral arteries bilaterally likely due to atherosclerotic stenosis at the bifurcation bilaterally. These results will be called to the ordering clinician or representative by the Radiologist Assistant, and communication documented in the PACS or zVision Dashboard. Electronically Signed   By: Franchot Gallo M.D.   On: 05/27/2018 08:11   Chest CTA 1. 4.1 cm ascending aortic aneurysm. Recommend annual imaging followup by CTA or MRA. This recommendation follows 2010 ACCF/AHA/AATS/ACR/ASA/SCA/SCAI/SIR/STS/SVM Guidelines for the Diagnosis and Management of Patients with Thoracic Aortic Disease. Circulation. 2010; 121: Q825-O037. 2. Severe stenosis of the right subclavian artery origin. 3. Tandem segments of severe stenosis of right common carotid artery and right proximal internal carotid artery. 4. Mild left common carotid artery and moderate left internal carotid artery stenosis. 5. Absent enhancement within the mid right vertebral artery and gradient  enhancement descending from the vertebrobasilar junction probably representing retrograde flow within the right vertebral artery. 6. Widely patent left vertebral artery. 7. Necrotic masslike opacity with fluid level in the right upper lobe measuring up to 6.9 cm. Findings may represent necrotic pneumonia, old cavitary scarring, or malignancy. 8. 15 mm spiculated nodule in left upper lobe. 9. 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 Parotid neoplasms are more commonly benign, however malignant parotid neoplasms can have a similar appearance.    Assessment and Plan:   1. Preoperative cardiovascular evaluation - for a high risk vascular surgery - the patient is fully independent with no symptoms and can achieve at least 6 METS - I have reviewed his echocardiogram and his LVEF is 55-60% with no regional wall motion abnormalities - his aortic valve is sclerotic but not stenotic - ECG has negative T waves in the inferior leads but in the absence of symptoms I wouldn't pursue any ischemic workup - continue ASA and high dose atorvastatin, switch carvedilol to metoprolol 50 mg po BID as he is wheezing - the patient is considered moderate risk for the high risk surgery based on his comorbidities, however no further cardiac workup is necessary at this point, we will follow in the post op period - the patient can be discharged home  2. Ascending aortic aneurysm - max diameter 41 mm, we will follow with yearly echos or CTAs  3. Hypertension - add losartan 25 mg po daily  4. COPD - he is wheezing, I would continue albuterol/atrovent, consider short course of steroid prior to surgery  5. Acute  Thromboembolic CVA in the setting of severe right ICA stenosis - per neurology  For questions or updates, please contact [place] Please consult [email or web address] for contact info under   Signed, Ena Dawley, MD  05/30/2018 2:27 PM

## 2018-05-30 NOTE — Discharge Summary (Signed)
PATIENT DETAILS Name: CLIMMIE BUELOW Age: 84 y.o. Sex: male Date of Birth: [birth year] MRN: [id number]. Admitting Physician: Oswald Hillock, MD BZJ:IRCVELFY, Eli Phillips, MD  Admit Date: 05/26/2018 Discharge date: 05/30/2018  Recommendations for Outpatient Follow-up:  1. Follow up with PCP in 1-2 weeks 2. Please obtain BMP/CBC in one week 3. Please ensure follow-up with vascular surgery this Wednesday for a carotid endarterectomy 4. Please ensure follow-up with oncology for lung mass seen incidentally on imaging studies.   Admitted From:  Home  Disposition: Home with home health services   Home Health: Yes  Equipment/Devices: None  Discharge Condition: Stable/  CODE STATUS: FULL CODE  Diet recommendation:  Heart Healthy   Brief Summary: Patient is a 84 y.o. male with history of COPD, hypertension presented with slurred speech and left-sided weakness to [facility], further work-up revealed a acute CVA involving the right lenticular nucleus.  Further work-up demonstrated significant stenosis in the right internal carotid artery-patient was transferred to [facility] for stroke MD/vascular surgery evaluation.  See below for further details.  Brief Hospital Course: Acute CVA: Likely thromboembolic in the setting of severe right ICA stenosis, non-focal-neurological deficits have improved. CTA neck shows tandem segments of severe stenosis in the right common carotid artery and right proximal internal carotid artery.  Transthoracic echo shows EF around 40 to 45%.  A1c 5.2, LDL 56.  Telemetry shows sinus rhythm.  Continue with antiplatelets and statin.  Vascular surgery planning on right CEA this Wednesday.    Seen by cardiology preoperatively this morning-high risk but no further work-up required.  Spoke with vascular surgery-okay to discharge-they will call patient for follow-up instructions regarding carotid endarterectomy this coming Wednesday.   Right internal carotid artery  stenosis: See above-right CEA plan for this Wednesday  Hypertension: Fluctuating but BP on the higher side-allow some amount of permissive hypertension-given the right internal carotid artery stenosis.  Continue cautiously with metoprolol and losartan.   COPD: Stable-continue inhalers  Tobacco abuse: Counseled-starting transdermal nicotine.  Right upper lobe lung necrotic mass: Incidentally found on imaging done for work-up of carotid stenosis-does not have any signs of pneumonia-therefore think this is a primary lung malignancy.  Discussed this finding with both patient and son at bedside-they are aware that they need to seek an outpatient oncology opinion-probably a PET scan after carotid endarterectomy has been completed.  I have placed number for [facility] on the discharge paperwork-have instructed the patient and family to call for follow-up appointment.  Procedures/Studies: None  Discharge Diagnoses:  Active Problems:   Stroke (cerebrum) (HCC)   CVA (cerebral vascular accident) [facility])   Discharge Instructions:  Activity:  As tolerated with Full fall precautions use walker/cane & assistance as needed   Discharge Instructions    Ambulatory referral to Neurology   Complete by:  As directed    Follow up with stroke clinic NP (Jessica Vanschaick or Cecille Rubin, if both not available, consider Zachery Dauer, or [place]) at [facility] in about 4 weeks. Thanks.   Diet - low sodium heart healthy   Complete by:  As directed    Discharge instructions   Complete by:  As directed    Follow with Primary MD  Fran Lowes, MD in 1 week  Follow with [facility] for lung mass in 1-2 weeks  Vascular Surgery will call you with follow up instructions regarding the surgery coming up this Wednesday.  STOP SMOKING!!  Please get a complete blood count and chemistry panel checked by  your Primary MD at your next visit, and again as instructed by your Primary MD.  Get Medicines  reviewed and adjusted: Please take all your medications with you for your next visit with your Primary MD  Laboratory/radiological data: Please request your Primary MD to go over all hospital tests and procedure/radiological results at the follow up, please ask your Primary MD to get all Hospital records sent to his/her office.  In some cases, they will be blood work, cultures and biopsy results pending at the time of your discharge. Please request that your primary care M.D. follows up on these results.  Also Note the following: If you experience worsening of your admission symptoms, develop shortness of breath, life threatening emergency, suicidal or homicidal thoughts you must seek medical attention immediately by calling 911 or calling your MD immediately  if symptoms less severe.  You must read complete instructions/literature along with all the possible adverse reactions/side effects for all the Medicines you take and that have been prescribed to you. Take any new Medicines after you have completely understood and accpet all the possible adverse reactions/side effects.   Do not drive when taking Pain medications or sleeping medications (Benzodaizepines)  Do not take more than prescribed Pain, Sleep and Anxiety Medications. It is not advisable to combine anxiety,sleep and pain medications without talking with your primary care practitioner  Special Instructions: If you have smoked or chewed Tobacco  in the last 2 yrs please stop smoking, stop any regular Alcohol  and or any Recreational drug use.  Wear Seat belts while driving.  Please note: You were cared for by a hospitalist during your hospital stay. Once you are discharged, your primary care physician will handle any further medical issues. Please note that NO REFILLS for any discharge medications will be authorized once you are discharged, as it is imperative that you return to your primary care physician (or establish a relationship  with a primary care physician if you do not have one) for your post hospital discharge needs so that they can reassess your need for medications and monitor your lab values.   Increase activity slowly   Complete by:  As directed      Allergies as of 05/30/2018   No Known Allergies     Medication List    STOP taking these medications   amLODipine 10 MG tablet Commonly known as:  NORVASC   carvedilol 12.5 MG tablet Commonly known as:  COREG   naproxen sodium 275 MG tablet Commonly known as:  ANAPROX   valsartan 160 MG tablet Commonly known as:  DIOVAN     TAKE these medications   albuterol 108 (90 Base) MCG/ACT inhaler Commonly known as:  PROVENTIL HFA;VENTOLIN HFA Inhale 2 puffs into the lungs every 6 (six) hours as needed for wheezing.   albuterol (2.5 MG/3ML) 0.083% nebulizer solution Commonly known as:  PROVENTIL Take 3 mLs (2.5 mg total) by nebulization every 4 (four) hours as needed for wheezing.   aspirin 325 MG tablet Take 1 tablet (325 mg total) by mouth daily. Start taking on:  05/31/2018   atorvastatin 40 MG tablet Commonly known as:  LIPITOR Take 1 tablet (40 mg total) by mouth daily at 6 PM.   losartan 25 MG tablet Commonly known as:  COZAAR Take 1 tablet (25 mg total) by mouth daily. What changed:    medication strength  how much to take   metoprolol tartrate 50 MG tablet Commonly known as:  LOPRESSOR Take 1 tablet (  50 mg total) by mouth 2 (two) times daily.   nicotine 21 mg/24hr patch Commonly known as:  NICODERM CQ - dosed in mg/24 hours Place 1 patch (21 mg total) onto the skin daily. Start taking on:  05/31/2018   SYMBICORT 160-4.5 MCG/ACT inhaler Generic drug:  budesonide-formoterol Inhale 2 puffs into the lungs 2 (two) times daily.   tamsulosin 0.4 MG Caps capsule Commonly known as:  FLOMAX Take 1 capsule by mouth daily.   traMADol 50 MG tablet Commonly known as:  ULTRAM Take 50 mg by mouth 3 (three) times daily.              Durable Medical Equipment  (From admission, onward)         Start     Ordered   05/30/18 1637  For home use only DME Walker rolling  [facility])  Once    Question:  Patient needs a walker to treat with the following condition  Answer:  CVA (cerebral vascular accident) ([place])   05/30/18 1640   05/28/18 1257  For home use only DME 3 n 1  Once     05/28/18 1257   05/28/18 1256  For home use only DME Walker rolling  Once    Question:  Patient needs a walker to treat with the following condition  Answer:  Physical deconditioning   05/28/18 1257         Follow-up Information    [facility] [place] Follow up.   Specialty:  Home Health Services Why:  Home Health Physical Therapy; Occupational Therapy Contact information: [place] [address] [phone]        [facility] - Dme Follow up.   Why:  [facility] Contact information: [address] [place] [address] [phone]        [facility]. Schedule an appointment as soon as possible for a visit in 4 week(s).   Specialty:  Neurology Contact information: [place] [phone]       [place] Follow up.   Specialty:  Oncology Why:  CALL FOR APPOINTMENT IN 1-2 WEEKS FOR A FOLLOW UP FOR LUNG MASS Contact information: [address] [id number] mc Linna [place]       Fran Lowes, MD. Schedule an appointment as soon as possible for a visit in 1 week(s).   Specialty:  Nephrology Contact information: [address] [place] [address] [phone]          No Known Allergies  Consultations:  Neurology, cardiology and vascular surgery.   Other Procedures/Studies: Ct Angio Head W Or Wo Contrast  Result Date: 05/29/2018 CLINICAL DATA:  84 y/o  M; stroke for follow-up. EXAM: CT ANGIOGRAPHY HEAD  AND NECK TECHNIQUE: Multidetector CT imaging of the head and neck was performed using the standard protocol during bolus administration of intravenous contrast. Multiplanar CT image reconstructions and MIPs were obtained to evaluate the vascular anatomy. Carotid stenosis measurements (when applicable) are obtained utilizing NASCET criteria, using the distal internal carotid diameter as the denominator. CONTRAST:  50 cc Isovue 370 COMPARISON:  05/26/2018 CT head. 05/27/2018 MRI and MRA head. 05/27/2018 carotid ultrasound. FINDINGS: CT HEAD FINDINGS Brain: Hypoattenuation within the right caudate body corresponding to acute infarction on prior MRI of the head. No new acute stroke, hemorrhage, focal mass effect, hydrocephalus, extra-axial collection, or herniation. Stable chronic microvascular ischemic changes and volume  loss of the brain. Vascular: Calcific atherosclerosis of carotid siphons and vertebral arteries. Skull: Normal. Negative for fracture or focal lesion. Sinuses: Mild frontal sinus and left posterior ethmoid sinus mucosal thickening. Visualized paranasal sinuses are otherwise normally aerated. Normal aeration of mastoid air cells. Orbits: Bilateral intra-ocular lens replacement. Review of the MIP images confirms the above findings CTA NECK FINDINGS Aortic arch: Ascending aortic aneurysm measuring 4.1 cm. Moderate calcific atherosclerosis of the aorta. Right subclavian artery origin predominantly calcified plaque with severe 90% stenosis. Right carotid system: Right common carotid artery mixed plaque with severe stenosis just upstream to the vertebrobasilar junction. Dense calcified plaque of the right proximal internal carotid artery with long segment of severe stenosis. No dissection or occlusion. Left carotid system: Predominant fibrofatty plaque of the common carotid artery with mild less than 50% stenosis. Dense calcified plaque of the left carotid bifurcation and proximal left internal carotid  artery with moderate 60% proximal ICA stenosis. Vertebral arteries: Diminutive right vertebral artery. Calcified plaque of the right vertebral artery origin with severe stenosis. There is a long segment of non opacification the right vertebral artery the V2 segment and gradient attenuation of the upper vertebral artery in the neck from the vertebrobasilar junction, probably representing reversal of flow. Widely patent dominant left vertebral artery. Skeleton: C4-5 grade 1 anterolisthesis and mild reversal of curvature at C5-6. Moderate to severe discogenic degenerative changes at C5-C7. Prominent multilevel cervical facet arthrosis. Other neck: Left upper lobe spiculated nodule measuring 15 x 14 mm (series 9, image 169). Right upper lobe necrotic masslike lesion containing fluid level measuring 3.3 x 6.9 x 5.3 cm (AP x ML x CC series 9, image 162 and series 12, image 128). Upper chest: 8 x 11 mm nodule within the parotid gland and superficial deep junction (series 11, image 182). Review of the MIP images confirms the above findings CTA HEAD FINDINGS Anterior circulation: Calcific atherosclerosis of the carotid siphons. Mild-to-moderate left cavernous and mild bilateral paraclinoid ICA stenosis. Patent bilateral ACA and MCA without high-grade stenosis or aneurysm. Mild distal left M1 stenosis. Posterior circulation: No high-grade stenosis, proximal occlusion, aneurysm, or vascular malformation. Moderate distal left P2 and mild right P1 stenosis. Venous sinuses: As permitted by contrast timing, patent. Anatomic variants: None significant. Delayed phase: No abnormal intracranial enhancement. Review of the MIP images confirms the above findings IMPRESSION: CT head: 1. Stable acute/early subacute stroke within the right caudate body. No new acute intracranial abnormality identified. 2. Stable chronic microvascular ischemic changes and volume loss of the brain. CTA neck: 1. 4.1 cm ascending aortic aneurysm. Recommend  annual imaging followup by CTA or MRA. This recommendation follows 2010 ACCF/AHA/AATS/ACR/ASA/SCA/SCAI/SIR/STS/SVM Guidelines for the Diagnosis and Management of Patients with Thoracic Aortic Disease. Circulation. 2010; 121: Z610-R604. 2. Severe stenosis of the right subclavian artery origin. 3. Tandem segments of severe stenosis of right common carotid artery and right proximal internal carotid artery. 4. Mild left common carotid artery and moderate left internal carotid artery stenosis. 5. Absent enhancement within the mid right vertebral artery and gradient enhancement descending from the vertebrobasilar junction probably representing retrograde flow within the right vertebral artery. 6. Widely patent left vertebral artery. 7. Necrotic masslike opacity with fluid level in the right upper lobe measuring up to 6.9 cm. Findings may represent necrotic pneumonia, old cavitary scarring, or malignancy. 8. 15 mm spiculated nodule in left upper lobe. 9. 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 Parotid neoplasms are more commonly benign, however malignant  parotid neoplasms can have a similar appearance. CTA head: 1. No large vessel occlusion, aneurysm, high-grade stenosis, or vascular malformation. 2. Intracranial atherosclerosis with multiple areas of mild-to-moderate stenosis. Electronically Signed   By: Kristine Garbe M.D.   On: 05/29/2018 04:48   Dg Chest 2 View  Result Date: 05/27/2018 CLINICAL DATA:  Stroke-like symptoms EXAM: CHEST - 2 VIEW COMPARISON:  10/19/2014 FINDINGS: Linear atelectasis or scar in the right upper lobe. Hyperinflated lungs. No focal opacity or pleural effusion. Normal cardiomediastinal silhouette with aortic atherosclerosis. Emphysematous changes. Multiple mild lower thoracic compression deformities. IMPRESSION: Hyperinflation with emphysematous changes and atelectasis or scarring in the right upper lobe. Electronically Signed   By: Donavan Foil M.D.   On: 05/27/2018 00:49   Ct Angio Neck W Or Wo Contrast  Result Date: 05/29/2018 CLINICAL DATA:  84 y/o  M; stroke for follow-up. EXAM: CT ANGIOGRAPHY HEAD AND NECK TECHNIQUE: Multidetector CT imaging of the head and neck was performed using the standard protocol during bolus administration of intravenous contrast. Multiplanar CT image reconstructions and MIPs were obtained to evaluate the vascular anatomy. Carotid stenosis measurements (when applicable) are obtained utilizing NASCET criteria, using the distal internal carotid diameter as the denominator. CONTRAST:  50 cc Isovue 370 COMPARISON:  05/26/2018 CT head. 05/27/2018 MRI and MRA head. 05/27/2018 carotid ultrasound. FINDINGS: CT HEAD FINDINGS Brain: Hypoattenuation within the right caudate body corresponding to acute infarction on prior MRI of the head. No new acute stroke, hemorrhage, focal mass effect, hydrocephalus, extra-axial collection, or herniation. Stable chronic microvascular ischemic changes and volume loss of the brain. Vascular: Calcific atherosclerosis of carotid siphons and vertebral arteries. Skull: Normal. Negative for fracture or focal lesion. Sinuses: Mild frontal sinus and left posterior ethmoid sinus mucosal thickening. Visualized paranasal sinuses are otherwise normally aerated. Normal aeration of mastoid air cells. Orbits: Bilateral intra-ocular lens replacement. Review of the MIP images confirms the above findings CTA NECK FINDINGS Aortic arch: Ascending aortic aneurysm measuring 4.1 cm. Moderate calcific atherosclerosis of the aorta. Right subclavian artery origin predominantly calcified plaque with severe 90% stenosis. Right carotid system: Right common carotid artery mixed plaque with severe stenosis just upstream to the vertebrobasilar junction. Dense calcified plaque of the right proximal internal carotid artery with long segment of severe stenosis. No dissection or occlusion. Left carotid system: Predominant  fibrofatty plaque of the common carotid artery with mild less than 50% stenosis. Dense calcified plaque of the left carotid bifurcation and proximal left internal carotid artery with moderate 60% proximal ICA stenosis. Vertebral arteries: Diminutive right vertebral artery. Calcified plaque of the right vertebral artery origin with severe stenosis. There is a long segment of non opacification the right vertebral artery the V2 segment and gradient attenuation of the upper vertebral artery in the neck from the vertebrobasilar junction, probably representing reversal of flow. Widely patent dominant left vertebral artery. Skeleton: C4-5 grade 1 anterolisthesis and mild reversal of curvature at C5-6. Moderate to severe discogenic degenerative changes at C5-C7. Prominent multilevel cervical facet arthrosis. Other neck: Left upper lobe spiculated nodule measuring 15 x 14 mm (series 9, image 169). Right upper lobe necrotic masslike lesion containing fluid level measuring 3.3 x 6.9 x 5.3 cm (AP x ML x CC series 9, image 162 and series 12, image 128). Upper chest: 8 x 11 mm nodule within the parotid gland and superficial deep junction (series 11, image 182). Review of the MIP images confirms the above findings CTA HEAD FINDINGS Anterior circulation: Calcific atherosclerosis of the carotid siphons.  Mild-to-moderate left cavernous and mild bilateral paraclinoid ICA stenosis. Patent bilateral ACA and MCA without high-grade stenosis or aneurysm. Mild distal left M1 stenosis. Posterior circulation: No high-grade stenosis, proximal occlusion, aneurysm, or vascular malformation. Moderate distal left P2 and mild right P1 stenosis. Venous sinuses: As permitted by contrast timing, patent. Anatomic variants: None significant. Delayed phase: No abnormal intracranial enhancement. Review of the MIP images confirms the above findings IMPRESSION: CT head: 1. Stable acute/early subacute stroke within the right caudate body. No new acute  intracranial abnormality identified. 2. Stable chronic microvascular ischemic changes and volume loss of the brain. CTA neck: 1. 4.1 cm ascending aortic aneurysm. Recommend annual imaging followup by CTA or MRA. This recommendation follows 2010 ACCF/AHA/AATS/ACR/ASA/SCA/SCAI/SIR/STS/SVM Guidelines for the Diagnosis and Management of Patients with Thoracic Aortic Disease. Circulation. 2010; 121: O242-P536. 2. Severe stenosis of the right subclavian artery origin. 3. Tandem segments of severe stenosis of right common carotid artery and right proximal internal carotid artery. 4. Mild left common carotid artery and moderate left internal carotid artery stenosis. 5. Absent enhancement within the mid right vertebral artery and gradient enhancement descending from the vertebrobasilar junction probably representing retrograde flow within the right vertebral artery. 6. Widely patent left vertebral artery. 7. Necrotic masslike opacity with fluid level in the right upper lobe measuring up to 6.9 cm. Findings may represent necrotic pneumonia, old cavitary scarring, or malignancy. 8. 15 mm spiculated nodule in left upper lobe. 9. 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 Parotid neoplasms are more commonly benign, however malignant parotid neoplasms can have a similar appearance. CTA head: 1. No large vessel occlusion, aneurysm, high-grade stenosis, or vascular malformation. 2. Intracranial atherosclerosis with multiple areas of mild-to-moderate stenosis. Electronically Signed   By: Kristine Garbe M.D.   On: 05/29/2018 04:48   Ct Chest Wo Contrast  Result Date: 05/29/2018 CLINICAL DATA:  Possible lung mass. Chronic obstructive pulmonary disease. EXAM: CT CHEST WITHOUT CONTRAST TECHNIQUE: Multidetector CT imaging of the chest was performed following the standard protocol without IV contrast. COMPARISON:  Radiographs of May 27, 2018. FINDINGS: Cardiovascular: 4.1 cm  ascending thoracic aortic aneurysm is noted. Atherosclerosis of thoracic aorta is noted. Coronary artery calcifications are noted. Normal cardiac size. No pericardial effusion is noted. Mediastinum/Nodes: No enlarged mediastinal or axillary lymph nodes. Thyroid gland, trachea, and esophagus demonstrate no significant findings. Lungs/Pleura: No pneumothorax or pleural effusion is noted. Hyperexpansion of the lungs is noted. 4.6 x 3.1 cm cavitating abnormality is noted in right upper lobe which may represent pneumonia or malignancy. Also noted is 17 x 11 mm spiculated density laterally in the left upper lobe with pleural tail, highly concerning for malignancy. Emphysematous disease is noted in both upper lobes. Upper Abdomen: No acute abnormality. Musculoskeletal: No chest wall mass or suspicious bone lesions identified. IMPRESSION: 4.6 x 3.1 cm cavitating lesion is noted in right upper lobe concerning for cavitating pneumonia or necrotic malignancy. Clinical correlation is recommended. 1.7 cm spiculated density is noted laterally in left upper lobe with pleural tail, highly concerning for malignancy. Coronary artery calcifications are noted suggesting coronary artery disease. 4.1 cm ascending thoracic aortic aneurysm is noted. Recommend annual imaging followup by CTA or MRA. This recommendation follows 2010 ACCF/AHA/AATS/ACR/ASA/SCA/SCAI/SIR/STS/SVM Guidelines for the Diagnosis and Management of Patients with Thoracic Aortic Disease. Circulation. 2010; 121: R443-X540. Aortic Atherosclerosis (ICD10-I70.0) and Emphysema (ICD10-J43.9). Electronically Signed   By: Marijo Conception, M.D.   On: 05/29/2018 19:37   Mr Brain Wo Contrast  Result Date: 05/27/2018 CLINICAL DATA:  Focal neuro deficit less than 6 hours. Suspect stroke. Left-sided weakness. EXAM: MRI HEAD WITHOUT CONTRAST MRA HEAD WITHOUT CONTRAST TECHNIQUE: Multiplanar, multiecho pulse sequences of the brain and surrounding structures were obtained without  intravenous contrast. Angiographic images of the head were obtained using MRA technique without contrast. COMPARISON:  CT head 05/26/2018 FINDINGS: MRI HEAD FINDINGS Brain: Acute infarct right lenticular nucleus involving the body of the caudate and deep white matter tracts. No other acute infarct. Moderate atrophy. Chronic microvascular ischemic changes in the white matter. Negative for hemorrhage or mass. Vascular: Normal arterial flow voids Skull and upper cervical spine: Negative Sinuses/Orbits: Bilateral cataract surgery. Paranasal sinuses clear. Other: None MRA HEAD FINDINGS Left vertebral artery widely patent and supplies the basilar. Distal right vertebral artery not visualized. PICA patent on the left. Superior cerebellar artery patent bilaterally. Posterior cerebral arteries have decreased signal distally which may be artifactual or due to proximal stenosis. Internal carotid artery patent bilaterally. Mild atherosclerotic stenosis in the cavernous carotid bilaterally. Anterior cerebral arteries patent bilaterally. Decreased signal in the middle cerebral artery branches bilaterally which may be due to severe M1 segment stenosis bilaterally versus artifact. IMPRESSION: Acute infarct right lenticular nucleus. Moderate atrophy and chronic microvascular ischemia. Occluded right vertebral artery. Decreased signal in the posterior cerebral arteries bilaterally which may be due to proximal severe stenosis. Decreased signal in the middle cerebral arteries bilaterally likely due to atherosclerotic stenosis at the bifurcation bilaterally. These results will be called to the ordering clinician or representative by the Radiologist Assistant, and communication documented in the PACS or zVision Dashboard. Electronically Signed   By: Franchot Gallo M.D.   On: 05/27/2018 08:11   US Carotid Bilateral (at [facility] Only)  Result Date: 05/27/2018 CLINICAL DATA:  Stroke EXAM: BILATERAL CAROTID DUPLEX ULTRASOUND  TECHNIQUE: Pearline Cables scale imaging, color Doppler and duplex ultrasound were performed of bilateral carotid and vertebral arteries in the neck. COMPARISON:  None. FINDINGS: Criteria: Quantification of carotid stenosis is based on velocity parameters that correlate the residual internal carotid diameter with NASCET-based stenosis levels, using the diameter of the distal internal carotid lumen as the denominator for stenosis measurement. The following velocity measurements were obtained: RIGHT ICA: 410 cm/sec CCA: 75 cm/sec SYSTOLIC ICA/CCA RATIO:  5.5 ECA: 218 cm/sec LEFT ICA: 39 cm/sec CCA: 818 cm/sec SYSTOLIC ICA/CCA RATIO:  0.7 ECA: 153 cm/sec RIGHT CAROTID ARTERY: Focal calcified plaque in the lower common carotid artery. Scattered calcified plaque along the wall of the mid and upper common carotid artery. Advanced calcified plaque in the bulb with shadowing of the lumen. Low resistance internal carotid Doppler pattern is preserved. Calcified plaque extends into the lower internal carotid artery. RIGHT VERTEBRAL ARTERY:  Nonvisualized LEFT CAROTID ARTERY: There is extensive irregular calcified plaque in the lower common carotid artery. There is prominent smooth mixed plaque in the mid and upper common carotid artery. There is advanced calcified plaque in the bulb with shadowing of the lumen. LEFT VERTEBRAL ARTERY:  Antegrade. IMPRESSION: Greater than 70% stenosis in the right internal carotid artery. 50-69% stenosis in the left internal carotid artery. Right vertebral artery was nonvisualized. Left vertebral artery is antegrade in flow. There is extensive plaque throughout both common carotid arteries and bulbs as described above. Electronically Signed   By: Marybelle Killings M.D.   On: 05/27/2018 08:57   Mr Jodene Nam Head/brain HU Cm  Result Date: 05/27/2018 CLINICAL DATA:  Focal neuro deficit less than 6 hours. Suspect stroke. Left-sided weakness.  EXAM: MRI HEAD WITHOUT CONTRAST MRA HEAD WITHOUT CONTRAST TECHNIQUE:  Multiplanar, multiecho pulse sequences of the brain and surrounding structures were obtained without intravenous contrast. Angiographic images of the head were obtained using MRA technique without contrast. COMPARISON:  CT head 05/26/2018 FINDINGS: MRI HEAD FINDINGS Brain: Acute infarct right lenticular nucleus involving the body of the caudate and deep white matter tracts. No other acute infarct. Moderate atrophy. Chronic microvascular ischemic changes in the white matter. Negative for hemorrhage or mass. Vascular: Normal arterial flow voids Skull and upper cervical spine: Negative Sinuses/Orbits: Bilateral cataract surgery. Paranasal sinuses clear. Other: None MRA HEAD FINDINGS Left vertebral artery widely patent and supplies the basilar. Distal right vertebral artery not visualized. PICA patent on the left. Superior cerebellar artery patent bilaterally. Posterior cerebral arteries have decreased signal distally which may be artifactual or due to proximal stenosis. Internal carotid artery patent bilaterally. Mild atherosclerotic stenosis in the cavernous carotid bilaterally. Anterior cerebral arteries patent bilaterally. Decreased signal in the middle cerebral artery branches bilaterally which may be due to severe M1 segment stenosis bilaterally versus artifact. IMPRESSION: Acute infarct right lenticular nucleus. Moderate atrophy and chronic microvascular ischemia. Occluded right vertebral artery. Decreased signal in the posterior cerebral arteries bilaterally which may be due to proximal severe stenosis. Decreased signal in the middle cerebral arteries bilaterally likely due to atherosclerotic stenosis at the bifurcation bilaterally. These results will be called to the ordering clinician or representative by the Radiologist Assistant, and communication documented in the PACS or zVision Dashboard. Electronically Signed   By: Franchot Gallo M.D.   On: 05/27/2018 08:11   Ct Head Code Stroke Wo Contrast  Result  Date: 05/26/2018 CLINICAL DATA:  Code stroke.  Left leg weakness EXAM: CT HEAD WITHOUT CONTRAST TECHNIQUE: Contiguous axial images were obtained from the base of the skull through the vertex without intravenous contrast. COMPARISON:  None. FINDINGS: Brain: There is no mass, hemorrhage or extra-axial collection. There is generalized atrophy without lobar predilection. There is no acute or chronic infarction. There is hypoattenuation of the periventricular white matter, most commonly indicating chronic ischemic microangiopathy. Vascular: No abnormal hyperdensity of the major intracranial arteries or dural venous sinuses. No intracranial atherosclerosis. Skull: The visualized skull base, calvarium and extracranial soft tissues are normal. Sinuses/Orbits: No fluid levels or advanced mucosal thickening of the visualized paranasal sinuses. No mastoid or middle ear effusion. The orbits are normal. ASPECTS [facility] Stroke Program Early CT Score) - Ganglionic level infarction (caudate, lentiform nuclei, internal capsule, insula, M1-M3 cortex): 7 - Supraganglionic infarction (M4-M6 cortex): 3 Total score (0-10 with 10 being normal): 10 IMPRESSION: 1. No intracranial hemorrhage. 2. ASPECTS is 10. These results were called by telephone at the time of interpretation on 05/26/2018 at 10:49 pm to Dr. Nat Christen , who verbally acknowledged these results. Electronically Signed   By: Ulyses Jarred M.D.   On: 05/26/2018 22:50      TODAY-DAY OF DISCHARGE:  Subjective:   Elihue Deharo today has no headache,no chest abdominal pain,no new weakness tingling or numbness, feels much better wants to go home today.   Objective:   Blood pressure (!) 162/79, pulse (!) 57, temperature 98.3 F (36.8 C), temperature source Oral, resp. rate 16, height 5\' 11"  (1.803 m), weight 56.9 kg, SpO2 99 %.  Intake/Output Summary (Last 24 hours) at 05/30/2018 1641 Last data filed at 05/30/2018 0858 Gross per 24 hour  Intake 360 ml  Output -    Net 360 ml   Filed Weights   05/26/18  2249 05/28/18 2048  Weight: 58.1 kg 56.9 kg    Exam: Awake Alert, Oriented *3, No new F.N deficits, Normal affect [place].AT,PERRAL Supple Neck,No JVD, No cervical lymphadenopathy appriciated.  Symmetrical Chest wall movement, Good air movement bilaterally, CTAB RRR,No Gallops,Rubs or new Murmurs, No Parasternal Heave +ve B.Sounds, Abd Soft, Non tender, No organomegaly appriciated, No rebound -guarding or rigidity. No Cyanosis, Clubbing or edema, No new Rash or bruise   PERTINENT RADIOLOGIC STUDIES: Ct Angio Head W Or Wo Contrast  Result Date: 05/29/2018 CLINICAL DATA:  84 y/o  M; stroke for follow-up. EXAM: CT ANGIOGRAPHY HEAD AND NECK TECHNIQUE: Multidetector CT imaging of the head and neck was performed using the standard protocol during bolus administration of intravenous contrast. Multiplanar CT image reconstructions and MIPs were obtained to evaluate the vascular anatomy. Carotid stenosis measurements (when applicable) are obtained utilizing NASCET criteria, using the distal internal carotid diameter as the denominator. CONTRAST:  50 cc Isovue 370 COMPARISON:  05/26/2018 CT head. 05/27/2018 MRI and MRA head. 05/27/2018 carotid ultrasound. FINDINGS: CT HEAD FINDINGS Brain: Hypoattenuation within the right caudate body corresponding to acute infarction on prior MRI of the head. No new acute stroke, hemorrhage, focal mass effect, hydrocephalus, extra-axial collection, or herniation. Stable chronic microvascular ischemic changes and volume loss of the brain. Vascular: Calcific atherosclerosis of carotid siphons and vertebral arteries. Skull: Normal. Negative for fracture or focal lesion. Sinuses: Mild frontal sinus and left posterior ethmoid sinus mucosal thickening. Visualized paranasal sinuses are otherwise normally aerated. Normal aeration of mastoid air cells. Orbits: Bilateral intra-ocular lens replacement. Review of the MIP images confirms the above  findings CTA NECK FINDINGS Aortic arch: Ascending aortic aneurysm measuring 4.1 cm. Moderate calcific atherosclerosis of the aorta. Right subclavian artery origin predominantly calcified plaque with severe 90% stenosis. Right carotid system: Right common carotid artery mixed plaque with severe stenosis just upstream to the vertebrobasilar junction. Dense calcified plaque of the right proximal internal carotid artery with long segment of severe stenosis. No dissection or occlusion. Left carotid system: Predominant fibrofatty plaque of the common carotid artery with mild less than 50% stenosis. Dense calcified plaque of the left carotid bifurcation and proximal left internal carotid artery with moderate 60% proximal ICA stenosis. Vertebral arteries: Diminutive right vertebral artery. Calcified plaque of the right vertebral artery origin with severe stenosis. There is a long segment of non opacification the right vertebral artery the V2 segment and gradient attenuation of the upper vertebral artery in the neck from the vertebrobasilar junction, probably representing reversal of flow. Widely patent dominant left vertebral artery. Skeleton: C4-5 grade 1 anterolisthesis and mild reversal of curvature at C5-6. Moderate to severe discogenic degenerative changes at C5-C7. Prominent multilevel cervical facet arthrosis. Other neck: Left upper lobe spiculated nodule measuring 15 x 14 mm (series 9, image 169). Right upper lobe necrotic masslike lesion containing fluid level measuring 3.3 x 6.9 x 5.3 cm (AP x ML x CC series 9, image 162 and series 12, image 128). Upper chest: 8 x 11 mm nodule within the parotid gland and superficial deep junction (series 11, image 182). Review of the MIP images confirms the above findings CTA HEAD FINDINGS Anterior circulation: Calcific atherosclerosis of the carotid siphons. Mild-to-moderate left cavernous and mild bilateral paraclinoid ICA stenosis. Patent bilateral ACA and MCA without  high-grade stenosis or aneurysm. Mild distal left M1 stenosis. Posterior circulation: No high-grade stenosis, proximal occlusion, aneurysm, or vascular malformation. Moderate distal left P2 and mild right P1 stenosis. Venous sinuses: As permitted by contrast  timing, patent. Anatomic variants: None significant. Delayed phase: No abnormal intracranial enhancement. Review of the MIP images confirms the above findings IMPRESSION: CT head: 1. Stable acute/early subacute stroke within the right caudate body. No new acute intracranial abnormality identified. 2. Stable chronic microvascular ischemic changes and volume loss of the brain. CTA neck: 1. 4.1 cm ascending aortic aneurysm. Recommend annual imaging followup by CTA or MRA. This recommendation follows 2010 ACCF/AHA/AATS/ACR/ASA/SCA/SCAI/SIR/STS/SVM Guidelines for the Diagnosis and Management of Patients with Thoracic Aortic Disease. Circulation. 2010; 121: L465-K354. 2. Severe stenosis of the right subclavian artery origin. 3. Tandem segments of severe stenosis of right common carotid artery and right proximal internal carotid artery. 4. Mild left common carotid artery and moderate left internal carotid artery stenosis. 5. Absent enhancement within the mid right vertebral artery and gradient enhancement descending from the vertebrobasilar junction probably representing retrograde flow within the right vertebral artery. 6. Widely patent left vertebral artery. 7. Necrotic masslike opacity with fluid level in the right upper lobe measuring up to 6.9 cm. Findings may represent necrotic pneumonia, old cavitary scarring, or malignancy. 8. 15 mm spiculated nodule in left upper lobe. 9. 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 Parotid neoplasms are more commonly benign, however malignant parotid neoplasms can have a similar appearance. CTA head: 1. No large vessel occlusion, aneurysm, high-grade stenosis, or vascular malformation.  2. Intracranial atherosclerosis with multiple areas of mild-to-moderate stenosis. Electronically Signed   By: Kristine Garbe M.D.   On: 05/29/2018 04:48   Dg Chest 2 View  Result Date: 05/27/2018 CLINICAL DATA:  Stroke-like symptoms EXAM: CHEST - 2 VIEW COMPARISON:  10/19/2014 FINDINGS: Linear atelectasis or scar in the right upper lobe. Hyperinflated lungs. No focal opacity or pleural effusion. Normal cardiomediastinal silhouette with aortic atherosclerosis. Emphysematous changes. Multiple mild lower thoracic compression deformities. IMPRESSION: Hyperinflation with emphysematous changes and atelectasis or scarring in the right upper lobe. Electronically Signed   By: Donavan Foil M.D.   On: 05/27/2018 00:49   Ct Angio Neck W Or Wo Contrast  Result Date: 05/29/2018 CLINICAL DATA:  84 y/o  M; stroke for follow-up. EXAM: CT ANGIOGRAPHY HEAD AND NECK TECHNIQUE: Multidetector CT imaging of the head and neck was performed using the standard protocol during bolus administration of intravenous contrast. Multiplanar CT image reconstructions and MIPs were obtained to evaluate the vascular anatomy. Carotid stenosis measurements (when applicable) are obtained utilizing NASCET criteria, using the distal internal carotid diameter as the denominator. CONTRAST:  50 cc Isovue 370 COMPARISON:  05/26/2018 CT head. 05/27/2018 MRI and MRA head. 05/27/2018 carotid ultrasound. FINDINGS: CT HEAD FINDINGS Brain: Hypoattenuation within the right caudate body corresponding to acute infarction on prior MRI of the head. No new acute stroke, hemorrhage, focal mass effect, hydrocephalus, extra-axial collection, or herniation. Stable chronic microvascular ischemic changes and volume loss of the brain. Vascular: Calcific atherosclerosis of carotid siphons and vertebral arteries. Skull: Normal. Negative for fracture or focal lesion. Sinuses: Mild frontal sinus and left posterior ethmoid sinus mucosal thickening. Visualized  paranasal sinuses are otherwise normally aerated. Normal aeration of mastoid air cells. Orbits: Bilateral intra-ocular lens replacement. Review of the MIP images confirms the above findings CTA NECK FINDINGS Aortic arch: Ascending aortic aneurysm measuring 4.1 cm. Moderate calcific atherosclerosis of the aorta. Right subclavian artery origin predominantly calcified plaque with severe 90% stenosis. Right carotid system: Right common carotid artery mixed plaque with severe stenosis just upstream to the vertebrobasilar junction. Dense calcified plaque of the right proximal  internal carotid artery with long segment of severe stenosis. No dissection or occlusion. Left carotid system: Predominant fibrofatty plaque of the common carotid artery with mild less than 50% stenosis. Dense calcified plaque of the left carotid bifurcation and proximal left internal carotid artery with moderate 60% proximal ICA stenosis. Vertebral arteries: Diminutive right vertebral artery. Calcified plaque of the right vertebral artery origin with severe stenosis. There is a long segment of non opacification the right vertebral artery the V2 segment and gradient attenuation of the upper vertebral artery in the neck from the vertebrobasilar junction, probably representing reversal of flow. Widely patent dominant left vertebral artery. Skeleton: C4-5 grade 1 anterolisthesis and mild reversal of curvature at C5-6. Moderate to severe discogenic degenerative changes at C5-C7. Prominent multilevel cervical facet arthrosis. Other neck: Left upper lobe spiculated nodule measuring 15 x 14 mm (series 9, image 169). Right upper lobe necrotic masslike lesion containing fluid level measuring 3.3 x 6.9 x 5.3 cm (AP x ML x CC series 9, image 162 and series 12, image 128). Upper chest: 8 x 11 mm nodule within the parotid gland and superficial deep junction (series 11, image 182). Review of the MIP images confirms the above findings CTA HEAD FINDINGS Anterior  circulation: Calcific atherosclerosis of the carotid siphons. Mild-to-moderate left cavernous and mild bilateral paraclinoid ICA stenosis. Patent bilateral ACA and MCA without high-grade stenosis or aneurysm. Mild distal left M1 stenosis. Posterior circulation: No high-grade stenosis, proximal occlusion, aneurysm, or vascular malformation. Moderate distal left P2 and mild right P1 stenosis. Venous sinuses: As permitted by contrast timing, patent. Anatomic variants: None significant. Delayed phase: No abnormal intracranial enhancement. Review of the MIP images confirms the above findings IMPRESSION: CT head: 1. Stable acute/early subacute stroke within the right caudate body. No new acute intracranial abnormality identified. 2. Stable chronic microvascular ischemic changes and volume loss of the brain. CTA neck: 1. 4.1 cm ascending aortic aneurysm. Recommend annual imaging followup by CTA or MRA. This recommendation follows 2010 ACCF/AHA/AATS/ACR/ASA/SCA/SCAI/SIR/STS/SVM Guidelines for the Diagnosis and Management of Patients with Thoracic Aortic Disease. Circulation. 2010; 121: J673-A193. 2. Severe stenosis of the right subclavian artery origin. 3. Tandem segments of severe stenosis of right common carotid artery and right proximal internal carotid artery. 4. Mild left common carotid artery and moderate left internal carotid artery stenosis. 5. Absent enhancement within the mid right vertebral artery and gradient enhancement descending from the vertebrobasilar junction probably representing retrograde flow within the right vertebral artery. 6. Widely patent left vertebral artery. 7. Necrotic masslike opacity with fluid level in the right upper lobe measuring up to 6.9 cm. Findings may represent necrotic pneumonia, old cavitary scarring, or malignancy. 8. 15 mm spiculated nodule in left upper lobe. 9. 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 Parotid neoplasms are more  commonly benign, however malignant parotid neoplasms can have a similar appearance. CTA head: 1. No large vessel occlusion, aneurysm, high-grade stenosis, or vascular malformation. 2. Intracranial atherosclerosis with multiple areas of mild-to-moderate stenosis. Electronically Signed   By: Kristine Garbe M.D.   On: 05/29/2018 04:48   Ct Chest Wo Contrast  Result Date: 05/29/2018 CLINICAL DATA:  Possible lung mass. Chronic obstructive pulmonary disease. EXAM: CT CHEST WITHOUT CONTRAST TECHNIQUE: Multidetector CT imaging of the chest was performed following the standard protocol without IV contrast. COMPARISON:  Radiographs of May 27, 2018. FINDINGS: Cardiovascular: 4.1 cm ascending thoracic aortic aneurysm is noted. Atherosclerosis of thoracic aorta is noted. Coronary artery calcifications are noted.  Normal cardiac size. No pericardial effusion is noted. Mediastinum/Nodes: No enlarged mediastinal or axillary lymph nodes. Thyroid gland, trachea, and esophagus demonstrate no significant findings. Lungs/Pleura: No pneumothorax or pleural effusion is noted. Hyperexpansion of the lungs is noted. 4.6 x 3.1 cm cavitating abnormality is noted in right upper lobe which may represent pneumonia or malignancy. Also noted is 17 x 11 mm spiculated density laterally in the left upper lobe with pleural tail, highly concerning for malignancy. Emphysematous disease is noted in both upper lobes. Upper Abdomen: No acute abnormality. Musculoskeletal: No chest wall mass or suspicious bone lesions identified. IMPRESSION: 4.6 x 3.1 cm cavitating lesion is noted in right upper lobe concerning for cavitating pneumonia or necrotic malignancy. Clinical correlation is recommended. 1.7 cm spiculated density is noted laterally in left upper lobe with pleural tail, highly concerning for malignancy. Coronary artery calcifications are noted suggesting coronary artery disease. 4.1 cm ascending thoracic aortic aneurysm is noted.  Recommend annual imaging followup by CTA or MRA. This recommendation follows 2010 ACCF/AHA/AATS/ACR/ASA/SCA/SCAI/SIR/STS/SVM Guidelines for the Diagnosis and Management of Patients with Thoracic Aortic Disease. Circulation. 2010; 121: X324-M010. Aortic Atherosclerosis (ICD10-I70.0) and Emphysema (ICD10-J43.9). Electronically Signed   By: Marijo Conception, M.D.   On: 05/29/2018 19:37   Mr Brain Wo Contrast  Result Date: 05/27/2018 CLINICAL DATA:  Focal neuro deficit less than 6 hours. Suspect stroke. Left-sided weakness. EXAM: MRI HEAD WITHOUT CONTRAST MRA HEAD WITHOUT CONTRAST TECHNIQUE: Multiplanar, multiecho pulse sequences of the brain and surrounding structures were obtained without intravenous contrast. Angiographic images of the head were obtained using MRA technique without contrast. COMPARISON:  CT head 05/26/2018 FINDINGS: MRI HEAD FINDINGS Brain: Acute infarct right lenticular nucleus involving the body of the caudate and deep white matter tracts. No other acute infarct. Moderate atrophy. Chronic microvascular ischemic changes in the white matter. Negative for hemorrhage or mass. Vascular: Normal arterial flow voids Skull and upper cervical spine: Negative Sinuses/Orbits: Bilateral cataract surgery. Paranasal sinuses clear. Other: None MRA HEAD FINDINGS Left vertebral artery widely patent and supplies the basilar. Distal right vertebral artery not visualized. PICA patent on the left. Superior cerebellar artery patent bilaterally. Posterior cerebral arteries have decreased signal distally which may be artifactual or due to proximal stenosis. Internal carotid artery patent bilaterally. Mild atherosclerotic stenosis in the cavernous carotid bilaterally. Anterior cerebral arteries patent bilaterally. Decreased signal in the middle cerebral artery branches bilaterally which may be due to severe M1 segment stenosis bilaterally versus artifact. IMPRESSION: Acute infarct right lenticular nucleus. Moderate  atrophy and chronic microvascular ischemia. Occluded right vertebral artery. Decreased signal in the posterior cerebral arteries bilaterally which may be due to proximal severe stenosis. Decreased signal in the middle cerebral arteries bilaterally likely due to atherosclerotic stenosis at the bifurcation bilaterally. These results will be called to the ordering clinician or representative by the Radiologist Assistant, and communication documented in the PACS or zVision Dashboard. Electronically Signed   By: Franchot Gallo M.D.   On: 05/27/2018 08:11   US Carotid Bilateral (at [facility] And [facility] Only)  Result Date: 05/27/2018 CLINICAL DATA:  Stroke EXAM: BILATERAL CAROTID DUPLEX ULTRASOUND TECHNIQUE: Pearline Cables scale imaging, color Doppler and duplex ultrasound were performed of bilateral carotid and vertebral arteries in the neck. COMPARISON:  None. FINDINGS: Criteria: Quantification of carotid stenosis is based on velocity parameters that correlate the residual internal carotid diameter with NASCET-based stenosis levels, using the diameter of the distal internal carotid lumen as the denominator for stenosis measurement. The following velocity measurements were obtained: RIGHT ICA:  410 cm/sec CCA: 75 cm/sec SYSTOLIC ICA/CCA RATIO:  5.5 ECA: 218 cm/sec LEFT ICA: 39 cm/sec CCA: 032 cm/sec SYSTOLIC ICA/CCA RATIO:  0.7 ECA: 153 cm/sec RIGHT CAROTID ARTERY: Focal calcified plaque in the lower common carotid artery. Scattered calcified plaque along the wall of the mid and upper common carotid artery. Advanced calcified plaque in the bulb with shadowing of the lumen. Low resistance internal carotid Doppler pattern is preserved. Calcified plaque extends into the lower internal carotid artery. RIGHT VERTEBRAL ARTERY:  Nonvisualized LEFT CAROTID ARTERY: There is extensive irregular calcified plaque in the lower common carotid artery. There is prominent smooth mixed plaque in the mid and upper common carotid artery. There is advanced  calcified plaque in the bulb with shadowing of the lumen. LEFT VERTEBRAL ARTERY:  Antegrade. IMPRESSION: Greater than 70% stenosis in the right internal carotid artery. 50-69% stenosis in the left internal carotid artery. Right vertebral artery was nonvisualized. Left vertebral artery is antegrade in flow. There is extensive plaque throughout both common carotid arteries and bulbs as described above. Electronically Signed   By: Marybelle Killings M.D.   On: 05/27/2018 08:57   Mr Jodene Nam Head/brain ZY Cm  Result Date: 05/27/2018 CLINICAL DATA:  Focal neuro deficit less than 6 hours. Suspect stroke. Left-sided weakness. EXAM: MRI HEAD WITHOUT CONTRAST MRA HEAD WITHOUT CONTRAST TECHNIQUE: Multiplanar, multiecho pulse sequences of the brain and surrounding structures were obtained without intravenous contrast. Angiographic images of the head were obtained using MRA technique without contrast. COMPARISON:  CT head 05/26/2018 FINDINGS: MRI HEAD FINDINGS Brain: Acute infarct right lenticular nucleus involving the body of the caudate and deep white matter tracts. No other acute infarct. Moderate atrophy. Chronic microvascular ischemic changes in the white matter. Negative for hemorrhage or mass. Vascular: Normal arterial flow voids Skull and upper cervical spine: Negative Sinuses/Orbits: Bilateral cataract surgery. Paranasal sinuses clear. Other: None MRA HEAD FINDINGS Left vertebral artery widely patent and supplies the basilar. Distal right vertebral artery not visualized. PICA patent on the left. Superior cerebellar artery patent bilaterally. Posterior cerebral arteries have decreased signal distally which may be artifactual or due to proximal stenosis. Internal carotid artery patent bilaterally. Mild atherosclerotic stenosis in the cavernous carotid bilaterally. Anterior cerebral arteries patent bilaterally. Decreased signal in the middle cerebral artery branches bilaterally which may be due to severe M1 segment stenosis  bilaterally versus artifact. IMPRESSION: Acute infarct right lenticular nucleus. Moderate atrophy and chronic microvascular ischemia. Occluded right vertebral artery. Decreased signal in the posterior cerebral arteries bilaterally which may be due to proximal severe stenosis. Decreased signal in the middle cerebral arteries bilaterally likely due to atherosclerotic stenosis at the bifurcation bilaterally. These results will be called to the ordering clinician or representative by the Radiologist Assistant, and communication documented in the PACS or zVision Dashboard. Electronically Signed   By: Franchot Gallo M.D.   On: 05/27/2018 08:11   Ct Head Code Stroke Wo Contrast  Result Date: 05/26/2018 CLINICAL DATA:  Code stroke.  Left leg weakness EXAM: CT HEAD WITHOUT CONTRAST TECHNIQUE: Contiguous axial images were obtained from the base of the skull through the vertex without intravenous contrast. COMPARISON:  None. FINDINGS: Brain: There is no mass, hemorrhage or extra-axial collection. There is generalized atrophy without lobar predilection. There is no acute or chronic infarction. There is hypoattenuation of the periventricular white matter, most commonly indicating chronic ischemic microangiopathy. Vascular: No abnormal hyperdensity of the major intracranial arteries or dural venous sinuses. No intracranial atherosclerosis. Skull: The visualized skull base, calvarium and  extracranial soft tissues are normal. Sinuses/Orbits: No fluid levels or advanced mucosal thickening of the visualized paranasal sinuses. No mastoid or middle ear effusion. The orbits are normal. ASPECTS [facility] Stroke Program Early CT Score) - Ganglionic level infarction (caudate, lentiform nuclei, internal capsule, insula, M1-M3 cortex): 7 - Supraganglionic infarction (M4-M6 cortex): 3 Total score (0-10 with 10 being normal): 10 IMPRESSION: 1. No intracranial hemorrhage. 2. ASPECTS is 10. These results were called by telephone at the time  of interpretation on 05/26/2018 at 10:49 pm to Dr. Nat Christen , who verbally acknowledged these results. Electronically Signed   By: Ulyses Jarred M.D.   On: 05/26/2018 22:50     PERTINENT LAB RESULTS: CBC: Recent Labs    05/28/18 0415  WBC 8.5  HGB 13.7  HCT 43.5  PLT 268   CMET CMP     Component Value Date/Time   NA 134 (L) 05/29/2018 0722   K 3.7 05/29/2018 0722   CL 102 05/29/2018 0722   CO2 23 05/29/2018 0722   GLUCOSE 101 (H) 05/29/2018 0722   BUN 19 05/29/2018 0722   CREATININE 1.26 (H) 05/29/2018 0722   CALCIUM 9.1 05/29/2018 0722   PROT 6.7 05/27/2018 2010   ALBUMIN 3.8 05/27/2018 2010   AST 22 05/27/2018 2010   ALT 10 05/27/2018 2010   ALKPHOS 73 05/27/2018 2010   BILITOT 0.7 05/27/2018 2010   GFRNONAA 51 (L) 05/29/2018 0722   GFRAA 59 (L) 05/29/2018 0722    GFR Estimated Creatinine Clearance: 35.8 mL/min (A) (by C-G formula based on SCr of 1.26 mg/dL (H)). No results for input(s): LIPASE, AMYLASE in the last 72 hours. No results for input(s): CKTOTAL, CKMB, CKMBINDEX, TROPONINI in the last 72 hours. Invalid input(s): POCBNP No results for input(s): DDIMER in the last 72 hours. No results for input(s): HGBA1C in the last 72 hours. No results for input(s): CHOL, HDL, LDLCALC, TRIG, CHOLHDL, LDLDIRECT in the last 72 hours. No results for input(s): TSH, T4TOTAL, T3FREE, THYROIDAB in the last 72 hours.  Invalid input(s): FREET3 No results for input(s): VITAMINB12, FOLATE, FERRITIN, TIBC, IRON, RETICCTPCT in the last 72 hours. Coags: No results for input(s): INR in the last 72 hours.  Invalid input(s): PT Microbiology: No results found for this or any previous visit (from the past 240 hour(s)).  FURTHER DISCHARGE INSTRUCTIONS:  Get Medicines reviewed and adjusted: Please take all your medications with you for your next visit with your Primary MD  Laboratory/radiological data: Please request your Primary MD to go over all hospital tests and  procedure/radiological results at the follow up, please ask your Primary MD to get all Hospital records sent to his/her office.  In some cases, they will be blood work, cultures and biopsy results pending at the time of your discharge. Please request that your primary care M.D. goes through all the records of your hospital data and follows up on these results.  Also Note the following: If you experience worsening of your admission symptoms, develop shortness of breath, life threatening emergency, suicidal or homicidal thoughts you must seek medical attention immediately by calling 911 or calling your MD immediately  if symptoms less severe.  You must read complete instructions/literature along with all the possible adverse reactions/side effects for all the Medicines you take and that have been prescribed to you. Take any new Medicines after you have completely understood and accpet all the possible adverse reactions/side effects.   Do not drive when taking Pain medications or sleeping medications (Benzodaizepines)  Do  not take more than prescribed Pain, Sleep and Anxiety Medications. It is not advisable to combine anxiety,sleep and pain medications without talking with your primary care practitioner  Special Instructions: If you have smoked or chewed Tobacco  in the last 2 yrs please stop smoking, stop any regular Alcohol  and or any Recreational drug use.  Wear Seat belts while driving.  Please note: You were cared for by a hospitalist during your hospital stay. Once you are discharged, your primary care physician will handle any further medical issues. Please note that NO REFILLS for any discharge medications will be authorized once you are discharged, as it is imperative that you return to your primary care physician (or establish a relationship with a primary care physician if you do not have one) for your post hospital discharge needs so that they can reassess your need for medications and  monitor your lab values.  Total Time spent coordinating discharge including counseling, education and face to face time equals 35 minutes.  SignedOren Binet 05/30/2018 4:41 PM

## 2018-05-30 NOTE — Progress Notes (Signed)
Physical Therapy Treatment Patient Details Name: [name] MRN: [id number] DOB: 05-19-1933 Today's Date: 05/30/2018    History of Present Illness Patient is 84 years old male with past medical history of COPD, hypertension presented to the hospital with slurred speech and left-sided weakness.  MRI of the brain showed right lenticular nucleus infarct. Hx of COPD.    PT Comments    Patient ambulating well this am. VSS throughout session. Tolerated increased distance without physical assist. Current POC remains appropriate. Educated on safety and mobility expectations upon discharge. Patient receptive.  Follow Up Recommendations  Home health PT;Supervision - Intermittent     Equipment Recommendations  Rolling walker with 5" wheels    Recommendations for Other Services Rehab consult     Precautions / Restrictions Precautions Precautions: Fall Restrictions Weight Bearing Restrictions: No    Mobility  Bed Mobility Overal bed mobility: Modified Independent             General bed mobility comments: Patient found at the edge of the bed  Transfers Overall transfer level: Needs assistance Equipment used: Rolling walker (2 wheeled)   Sit to Stand: Supervision         General transfer comment: Supervision for safety, use of RW upon coming to standing  Ambulation/Gait Ambulation/Gait assistance: Min guard Gait Distance (Feet): 180 Feet Assistive device: None Gait Pattern/deviations: Step-through pattern;Decreased stride length;Trunk flexed Gait velocity: decreased   General Gait Details: ambulated with min guard for safety, no physical assist required   Stairs             Wheelchair Mobility    Modified Rankin (Stroke Patients Only) Modified Rankin (Stroke Patients Only) Pre-Morbid Rankin Score: No significant disability Modified Rankin: Moderate disability     Balance Overall balance assessment: Needs assistance Sitting-balance support: Feet  supported;No upper extremity supported Sitting balance-Leahy Scale: Good     Standing balance support: During functional activity;No upper extremity supported Standing balance-Leahy Scale: Fair                              Cognition Arousal/Alertness: Awake/alert Behavior During Therapy: WFL for tasks assessed/performed Overall Cognitive Status: Within Functional Limits for tasks assessed                                        Exercises      General Comments        Pertinent Vitals/Pain Pain Assessment: No/denies pain    Home Living                      Prior Function            PT Goals (current goals can now be found in the care plan section) Acute Rehab PT Goals Patient Stated Goal: to go home PT Goal Formulation: With patient/family Time For Goal Achievement: 06/10/18 Potential to Achieve Goals: Good Progress towards PT goals: Progressing toward goals    Frequency    Min 4X/week      PT Plan Current plan remains appropriate    Co-evaluation              AM-PAC PT "6 Clicks" Daily Activity  Outcome Measure  Difficulty turning over in bed (including adjusting bedclothes, sheets and blankets)?: None Difficulty moving from lying on back to sitting on the side of the bed? :  A Little Difficulty sitting down on and standing up from a chair with arms (e.g., wheelchair, bedside commode, etc,.)?: A Little Help needed moving to and from a bed to chair (including a wheelchair)?: None Help needed walking in hospital room?: A Little Help needed climbing 3-5 steps with a railing? : A Little 6 Click Score: 20    End of Session Equipment Utilized During Treatment: Gait belt Activity Tolerance: Patient tolerated treatment well Patient left: in bed;with call bell/phone within reach;with family/visitor present(sitting EOB) Nurse Communication: Mobility status PT Visit Diagnosis: Unsteadiness on feet (R26.81);Other  abnormalities of gait and mobility (R26.89);Muscle weakness (generalized) (M62.81)     Time: 7225-7505 PT Time Calculation (min) (ACUTE ONLY): 16 min  Charges:  $Gait Training: 8-22 mins                     Alben Deeds, PT DPT  Board Certified Neurologic Specialist [place] Pager [phone] Office [phone]    Duncan Dull 05/30/2018, 8:43 AM

## 2018-05-31 ENCOUNTER — Other Ambulatory Visit: Payer: Self-pay | Admitting: *Deleted

## 2018-05-31 NOTE — Progress Notes (Signed)
Spoke to patient's son JIMMY. Reviewed pre -op instructions. NPO past MN night prior. Expect a call and follow the detailed instructions received from the hospital pre-admission department for this surgery. To be at [facility] admitting department at 9:15 am on 06/02/18 for surgery. Expect overnight stay. Verbalized understanding and will call this office if questions.

## 2018-06-01 ENCOUNTER — Other Ambulatory Visit: Payer: Self-pay

## 2018-06-01 ENCOUNTER — Encounter (HOSPITAL_COMMUNITY): Payer: Self-pay | Admitting: *Deleted

## 2018-06-01 NOTE — Anesthesia Preprocedure Evaluation (Addendum)
Anesthesia Evaluation  Patient identified by MRN, date of birth, ID band Patient awake    Reviewed: Allergy & Precautions, NPO status , Patient's Chart, lab work & pertinent test results  History of Anesthesia Complications Negative for: history of anesthetic complications  Airway Mallampati: II  TM Distance: >3 FB Neck ROM: Full    Dental no notable dental hx. (+) Edentulous Upper, Edentulous Lower   Pulmonary COPD,  COPD inhaler, former smoker,    Pulmonary exam normal breath sounds clear to auscultation       Cardiovascular hypertension, Pt. on medications +CHF  Normal cardiovascular exam Rhythm:Regular Rate:Normal  TTE 05/28/18: LVEF 40-45%, moderate LVH, global hypokinesis, grade 1 DD, calcified aortic valve without significant stenosis, trivial AI, trivial MR   Neuro/Psych CVA    GI/Hepatic Neg liver ROS, GERD  Medicated,  Endo/Other  negative endocrine ROS  Renal/GU negative Renal ROS     Musculoskeletal  (+) Arthritis , Osteoarthritis,    Abdominal   Peds  Hematology negative hematology ROS (+)   Anesthesia Other Findings Day of surgery medications reviewed with the patient.  Reproductive/Obstetrics                            Anesthesia Physical Anesthesia Plan  ASA: III  Anesthesia Plan: General   Post-op Pain Management:    Induction: Intravenous  PONV Risk Score and Plan: 2 and Ondansetron, Dexamethasone and Treatment may vary due to age or medical condition  Airway Management Planned: Oral ETT  Additional Equipment: Arterial line  Intra-op Plan:   Post-operative Plan: Extubation in OR  Informed Consent: I have reviewed the patients History and Physical, chart, labs and discussed the procedure including the risks, benefits and alternatives for the proposed anesthesia with the patient or authorized representative who has indicated his/her understanding and  acceptance.   Dental advisory given  Plan Discussed with: CRNA and Surgeon  Anesthesia Plan Comments:        Anesthesia Quick Evaluation

## 2018-06-01 NOTE — Progress Notes (Signed)
SDW-Pre-op call completed by pt son, Laverna Peace. Laverna Peace denies that pt C/O SOB and chest pain. Laverna Peace denies that pt is under the care of a cardiologist. Laverna Peace denies that pt had a stress test and cardiac cath. Jimmy made aware to have pt stop taking vitamins, fish oil and herbal medications. Do not take any NSAIDs ie: Ibuprofen, Advil, Naproxen (Aleve), Motrin, BC and Goody Powder.  Jimmy verbalized understanding of all pre-op instructions.

## 2018-06-02 ENCOUNTER — Inpatient Hospital Stay (HOSPITAL_COMMUNITY)
Admission: RE | Admit: 2018-06-02 | Discharge: 2018-06-03 | DRG: 039 | Disposition: A | Discharge status: Home / Self Care | Payer: Medicare Other | Attending: Surgery | Admitting: Surgery

## 2018-06-02 ENCOUNTER — Inpatient Hospital Stay (HOSPITAL_COMMUNITY): Payer: Medicare Other | Admitting: Physician Assistant

## 2018-06-02 ENCOUNTER — Encounter (HOSPITAL_COMMUNITY)
Admission: RE | Disposition: A | Discharge status: Home / Self Care | Payer: Self-pay | Source: Home / Self Care | Attending: Surgery

## 2018-06-02 ENCOUNTER — Encounter (HOSPITAL_COMMUNITY): Payer: Self-pay

## 2018-06-02 ENCOUNTER — Other Ambulatory Visit: Payer: Self-pay

## 2018-06-02 DIAGNOSIS — Z87891 Personal history of nicotine dependence: Secondary | ICD-10-CM | POA: Diagnosis not present

## 2018-06-02 DIAGNOSIS — Z8673 Personal history of transient ischemic attack (TIA), and cerebral infarction without residual deficits: Secondary | ICD-10-CM | POA: Diagnosis not present

## 2018-06-02 DIAGNOSIS — Z85828 Personal history of other malignant neoplasm of skin: Secondary | ICD-10-CM | POA: Diagnosis not present

## 2018-06-02 DIAGNOSIS — K219 Gastro-esophageal reflux disease without esophagitis: Secondary | ICD-10-CM | POA: Diagnosis present

## 2018-06-02 DIAGNOSIS — Z9981 Dependence on supplemental oxygen: Secondary | ICD-10-CM | POA: Diagnosis not present

## 2018-06-02 DIAGNOSIS — I10 Essential (primary) hypertension: Secondary | ICD-10-CM | POA: Diagnosis present

## 2018-06-02 DIAGNOSIS — J449 Chronic obstructive pulmonary disease, unspecified: Secondary | ICD-10-CM | POA: Diagnosis present

## 2018-06-02 DIAGNOSIS — I6529 Occlusion and stenosis of unspecified carotid artery: Secondary | ICD-10-CM | POA: Diagnosis present

## 2018-06-02 DIAGNOSIS — E7800 Pure hypercholesterolemia, unspecified: Secondary | ICD-10-CM | POA: Diagnosis present

## 2018-06-02 DIAGNOSIS — I6521 Occlusion and stenosis of right carotid artery: Secondary | ICD-10-CM | POA: Diagnosis not present

## 2018-06-02 HISTORY — PX: CAROTID ENDARTERECTOMY: SUR193

## 2018-06-02 HISTORY — DX: Cerebral infarction, unspecified: I63.9

## 2018-06-02 HISTORY — DX: Solitary pulmonary nodule: R91.1

## 2018-06-02 HISTORY — DX: Low back pain, unspecified: M54.50

## 2018-06-02 HISTORY — DX: Unspecified chronic bronchitis: J42

## 2018-06-02 HISTORY — DX: Gastro-esophageal reflux disease without esophagitis: K21.9

## 2018-06-02 HISTORY — DX: Occlusion and stenosis of right carotid artery: I65.21

## 2018-06-02 HISTORY — DX: Pure hypercholesterolemia, unspecified: E78.00

## 2018-06-02 HISTORY — PX: PATCH ANGIOPLASTY: SHX6230

## 2018-06-02 HISTORY — DX: Unspecified osteoarthritis, unspecified site: M19.90

## 2018-06-02 HISTORY — DX: Pneumonia, unspecified organism: J18.9

## 2018-06-02 HISTORY — DX: Low back pain: M54.5

## 2018-06-02 HISTORY — DX: Unspecified malignant neoplasm of skin, unspecified: C44.90

## 2018-06-02 HISTORY — DX: Other chronic pain: G89.29

## 2018-06-02 HISTORY — DX: Dependence on supplemental oxygen: Z99.81

## 2018-06-02 HISTORY — PX: ENDARTERECTOMY: SHX5162

## 2018-06-02 LAB — URINALYSIS, ROUTINE W REFLEX MICROSCOPIC
BACTERIA UA: NONE SEEN
BILIRUBIN URINE: NEGATIVE
Glucose, UA: NEGATIVE mg/dL
KETONES UR: NEGATIVE mg/dL
LEUKOCYTES UA: NEGATIVE
NITRITE: NEGATIVE
PH: 7 (ref 5.0–8.0)
PROTEIN: NEGATIVE mg/dL
Specific Gravity, Urine: 1.006 (ref 1.005–1.030)

## 2018-06-02 LAB — PROTIME-INR
INR: 1.01
Prothrombin Time: 13.2 seconds (ref 11.4–15.2)

## 2018-06-02 LAB — COMPREHENSIVE METABOLIC PANEL
ALBUMIN: 3.8 g/dL (ref 3.5–5.0)
ALK PHOS: 76 U/L (ref 38–126)
ALT: 12 U/L (ref 0–44)
ANION GAP: 8 (ref 5–15)
AST: 19 U/L (ref 15–41)
BILIRUBIN TOTAL: 0.7 mg/dL (ref 0.3–1.2)
BUN: 19 mg/dL (ref 8–23)
CO2: 20 mmol/L — AB (ref 22–32)
CREATININE: 1.27 mg/dL — AB (ref 0.61–1.24)
Calcium: 9 mg/dL (ref 8.9–10.3)
Chloride: 108 mmol/L (ref 98–111)
GFR calc Af Amer: 59 mL/min — ABNORMAL LOW (ref >60)
GFR calc non Af Amer: 50 mL/min — ABNORMAL LOW (ref >60)
GLUCOSE: 111 mg/dL — AB (ref 70–99)
Potassium: 4.6 mmol/L (ref 3.5–5.1)
Sodium: 136 mmol/L (ref 135–145)
Total Protein: 6.7 g/dL (ref 6.5–8.1)

## 2018-06-02 LAB — CBC
HCT: 44 % (ref 39.0–52.0)
HEMOGLOBIN: 14 g/dL (ref 13.0–17.0)
MCH: 29.9 pg (ref 26.0–34.0)
MCHC: 31.8 g/dL (ref 30.0–36.0)
MCV: 93.8 fL (ref 80.0–100.0)
Platelets: 280 10*3/uL (ref 150–400)
RBC: 4.69 MIL/uL (ref 4.22–5.81)
RDW: 14.5 % (ref 11.5–15.5)
WBC: 9.5 10*3/uL (ref 4.0–10.5)
nRBC: 0 % (ref 0.0–0.2)

## 2018-06-02 LAB — TYPE AND SCREEN
ABO/RH(D): O POS
Antibody Screen: NEGATIVE

## 2018-06-02 LAB — POCT ACTIVATED CLOTTING TIME: Activated Clotting Time: 241 seconds

## 2018-06-02 LAB — APTT: APTT: 33 s (ref 24–36)

## 2018-06-02 LAB — SURGICAL PCR SCREEN
MRSA, PCR: NEGATIVE
STAPHYLOCOCCUS AUREUS: NEGATIVE

## 2018-06-02 LAB — ABO/RH: ABO/RH(D): O POS

## 2018-06-02 SURGERY — ENDARTERECTOMY, CAROTID
Anesthesia: General | Site: Neck | Laterality: Right

## 2018-06-02 MED ORDER — PROTAMINE SULFATE 10 MG/ML IV SOLN
INTRAVENOUS | Status: AC
  Filled 2018-06-02: qty 5

## 2018-06-02 MED ORDER — ALBUTEROL SULFATE (2.5 MG/3ML) 0.083% IN NEBU: 2.5000 mg | INHALATION_SOLUTION | RESPIRATORY_TRACT | Status: DC | PRN

## 2018-06-02 MED ORDER — PROPOFOL 10 MG/ML IV BOLUS
INTRAVENOUS | Status: DC | PRN
  Administered 2018-06-02: 150 mg via INTRAVENOUS

## 2018-06-02 MED ORDER — SODIUM CHLORIDE 0.9 % IV SOLN
INTRAVENOUS | Status: DC
  Administered 2018-06-02: 16:00:00 via INTRAVENOUS

## 2018-06-02 MED ORDER — SODIUM CHLORIDE 0.9 % IV SOLN: 500.0000 mL | Freq: Once | INTRAVENOUS | Status: DC | PRN

## 2018-06-02 MED ORDER — CHLORHEXIDINE GLUCONATE 4 % EX LIQD: 60.0000 mL | Freq: Once | CUTANEOUS | Status: DC

## 2018-06-02 MED ORDER — MIDAZOLAM HCL 2 MG/2ML IJ SOLN: INTRAMUSCULAR | Status: DC | PRN

## 2018-06-02 MED ORDER — PROTAMINE SULFATE 10 MG/ML IV SOLN
INTRAVENOUS | Status: DC | PRN
  Administered 2018-06-02: 50 mg via INTRAVENOUS

## 2018-06-02 MED ORDER — EPHEDRINE SULFATE-NACL 50-0.9 MG/10ML-% IV SOSY
PREFILLED_SYRINGE | INTRAVENOUS | Status: DC | PRN
  Administered 2018-06-02 (×3): 5 mg via INTRAVENOUS

## 2018-06-02 MED ORDER — SODIUM CHLORIDE 0.9 % IV SOLN
0.0125 ug/kg/min | INTRAVENOUS | Status: AC
  Administered 2018-06-02: .05 ug/kg/min via INTRAVENOUS
  Filled 2018-06-02: qty 2000

## 2018-06-02 MED ORDER — ALUM & MAG HYDROXIDE-SIMETH 200-200-20 MG/5ML PO SUSP: 15.0000 mL | ORAL | Status: DC | PRN

## 2018-06-02 MED ORDER — FENTANYL CITRATE (PF) 250 MCG/5ML IJ SOLN
INTRAMUSCULAR | Status: AC
  Filled 2018-06-02: qty 5

## 2018-06-02 MED ORDER — ACETAMINOPHEN 10 MG/ML IV SOLN: 1000.0000 mg | Freq: Once | INTRAVENOUS | Status: DC | PRN

## 2018-06-02 MED ORDER — DEXAMETHASONE SODIUM PHOSPHATE 10 MG/ML IJ SOLN
INTRAMUSCULAR | Status: DC | PRN
  Administered 2018-06-02: 10 mg via INTRAVENOUS

## 2018-06-02 MED ORDER — FENTANYL CITRATE (PF) 100 MCG/2ML IJ SOLN
INTRAMUSCULAR | Status: AC
  Filled 2018-06-02: qty 2

## 2018-06-02 MED ORDER — ATORVASTATIN CALCIUM 40 MG PO TABS
40.0000 mg | ORAL_TABLET | Freq: Every day | ORAL | Status: DC
  Administered 2018-06-02: 40 mg via ORAL
  Filled 2018-06-02: qty 1

## 2018-06-02 MED ORDER — EPHEDRINE 5 MG/ML INJ
INTRAVENOUS | Status: AC
  Filled 2018-06-02: qty 10

## 2018-06-02 MED ORDER — POTASSIUM CHLORIDE CRYS ER 20 MEQ PO TBCR: 20.0000 meq | EXTENDED_RELEASE_TABLET | Freq: Every day | ORAL | Status: DC | PRN

## 2018-06-02 MED ORDER — ONDANSETRON HCL 4 MG/2ML IJ SOLN
INTRAMUSCULAR | Status: AC
  Filled 2018-06-02: qty 2

## 2018-06-02 MED ORDER — ONDANSETRON HCL 4 MG/2ML IJ SOLN
INTRAMUSCULAR | Status: DC | PRN
  Administered 2018-06-02: 4 mg via INTRAVENOUS

## 2018-06-02 MED ORDER — DOCUSATE SODIUM 100 MG PO CAPS
100.0000 mg | ORAL_CAPSULE | Freq: Every day | ORAL | Status: DC
  Administered 2018-06-03: 100 mg via ORAL
  Filled 2018-06-02: qty 1

## 2018-06-02 MED ORDER — OXYCODONE HCL 5 MG PO TABS: 5.0000 mg | ORAL_TABLET | Freq: Once | ORAL | Status: DC | PRN

## 2018-06-02 MED ORDER — PROMETHAZINE HCL 25 MG/ML IJ SOLN: 6.2500 mg | INTRAMUSCULAR | Status: DC | PRN

## 2018-06-02 MED ORDER — CEFAZOLIN SODIUM-DEXTROSE 2-4 GM/100ML-% IV SOLN
2.0000 g | Freq: Three times a day (TID) | INTRAVENOUS | Status: AC
  Administered 2018-06-02 – 2018-06-03 (×2): 2 g via INTRAVENOUS
  Filled 2018-06-02 (×3): qty 100

## 2018-06-02 MED ORDER — SUGAMMADEX SODIUM 200 MG/2ML IV SOLN
INTRAVENOUS | Status: DC | PRN
  Administered 2018-06-02: 110 mg via INTRAVENOUS

## 2018-06-02 MED ORDER — HEPARIN SODIUM (PORCINE) 1000 UNIT/ML IJ SOLN
INTRAMUSCULAR | Status: AC
  Filled 2018-06-02: qty 2

## 2018-06-02 MED ORDER — HEMOSTATIC AGENTS (NO CHARGE) OPTIME
TOPICAL | Status: DC | PRN
  Administered 2018-06-02: 1 via TOPICAL

## 2018-06-02 MED ORDER — SODIUM CHLORIDE 0.9 % IV SOLN
INTRAVENOUS | Status: DC | PRN
  Administered 2018-06-02: 500 mL

## 2018-06-02 MED ORDER — LOSARTAN POTASSIUM 25 MG PO TABS
25.0000 mg | ORAL_TABLET | Freq: Every day | ORAL | Status: DC
  Administered 2018-06-02 – 2018-06-03 (×2): 25 mg via ORAL
  Filled 2018-06-02 (×2): qty 1

## 2018-06-02 MED ORDER — MORPHINE SULFATE (PF) 2 MG/ML IV SOLN: 2.0000 mg | INTRAVENOUS | Status: AC | PRN

## 2018-06-02 MED ORDER — SODIUM CHLORIDE 0.9 % IV SOLN
INTRAVENOUS | Status: DC | PRN
  Administered 2018-06-02: 25 ug/min via INTRAVENOUS

## 2018-06-02 MED ORDER — FENTANYL CITRATE (PF) 100 MCG/2ML IJ SOLN
25.0000 ug | INTRAMUSCULAR | Status: DC | PRN
  Administered 2018-06-02 (×4): 25 ug via INTRAVENOUS

## 2018-06-02 MED ORDER — TAMSULOSIN HCL 0.4 MG PO CAPS
0.4000 mg | ORAL_CAPSULE | Freq: Every day | ORAL | Status: DC
  Administered 2018-06-03: 0.4 mg via ORAL
  Filled 2018-06-02: qty 1

## 2018-06-02 MED ORDER — LACTATED RINGERS IV SOLN
INTRAVENOUS | Status: DC
  Administered 2018-06-02 (×2): via INTRAVENOUS

## 2018-06-02 MED ORDER — OXYCODONE-ACETAMINOPHEN 5-325 MG PO TABS: 1.0000 | ORAL_TABLET | ORAL | Status: DC | PRN

## 2018-06-02 MED ORDER — LABETALOL HCL 5 MG/ML IV SOLN
10.0000 mg | INTRAVENOUS | Status: AC | PRN
  Administered 2018-06-02 – 2018-06-03 (×4): 10 mg via INTRAVENOUS
  Filled 2018-06-02: qty 4

## 2018-06-02 MED ORDER — MOMETASONE FURO-FORMOTEROL FUM 200-5 MCG/ACT IN AERO
2.0000 | INHALATION_SPRAY | Freq: Two times a day (BID) | RESPIRATORY_TRACT | Status: DC
  Administered 2018-06-02 – 2018-06-03 (×2): 2 via RESPIRATORY_TRACT
  Filled 2018-06-02: qty 8.8

## 2018-06-02 MED ORDER — OXYCODONE HCL 5 MG/5ML PO SOLN: 5.0000 mg | Freq: Once | ORAL | Status: DC | PRN

## 2018-06-02 MED ORDER — ACETAMINOPHEN 325 MG PO TABS
325.0000 mg | ORAL_TABLET | ORAL | Status: DC | PRN
  Administered 2018-06-02 – 2018-06-03 (×3): 650 mg via ORAL
  Filled 2018-06-02 (×3): qty 2

## 2018-06-02 MED ORDER — PROPOFOL 10 MG/ML IV BOLUS
INTRAVENOUS | Status: AC
  Filled 2018-06-02: qty 20

## 2018-06-02 MED ORDER — FENTANYL CITRATE (PF) 100 MCG/2ML IJ SOLN
INTRAMUSCULAR | Status: DC | PRN
  Administered 2018-06-02: 100 ug via INTRAVENOUS

## 2018-06-02 MED ORDER — ASPIRIN 325 MG PO TABS
325.0000 mg | ORAL_TABLET | Freq: Every day | ORAL | Status: DC
  Administered 2018-06-03: 325 mg via ORAL
  Filled 2018-06-02: qty 1

## 2018-06-02 MED ORDER — METOPROLOL TARTRATE 50 MG PO TABS
50.0000 mg | ORAL_TABLET | Freq: Two times a day (BID) | ORAL | Status: DC
  Administered 2018-06-02 – 2018-06-03 (×2): 50 mg via ORAL
  Filled 2018-06-02 (×2): qty 1

## 2018-06-02 MED ORDER — LABETALOL HCL 5 MG/ML IV SOLN
INTRAVENOUS | Status: AC
  Filled 2018-06-02: qty 4

## 2018-06-02 MED ORDER — 0.9 % SODIUM CHLORIDE (POUR BTL) OPTIME
TOPICAL | Status: DC | PRN
  Administered 2018-06-02: 2000 mL

## 2018-06-02 MED ORDER — ONDANSETRON HCL 4 MG/2ML IJ SOLN: 4.0000 mg | Freq: Four times a day (QID) | INTRAMUSCULAR | Status: DC | PRN

## 2018-06-02 MED ORDER — ROCURONIUM BROMIDE 50 MG/5ML IV SOSY
PREFILLED_SYRINGE | INTRAVENOUS | Status: AC
  Filled 2018-06-02: qty 5

## 2018-06-02 MED ORDER — MAGNESIUM SULFATE 2 GM/50ML IV SOLN: 2.0000 g | Freq: Every day | INTRAVENOUS | Status: DC | PRN

## 2018-06-02 MED ORDER — LIDOCAINE 2% (20 MG/ML) 5 ML SYRINGE
INTRAMUSCULAR | Status: AC
  Filled 2018-06-02: qty 5

## 2018-06-02 MED ORDER — PHENOL 1.4 % MT LIQD: 1.0000 | OROMUCOSAL | Status: DC | PRN

## 2018-06-02 MED ORDER — ALBUTEROL SULFATE HFA 108 (90 BASE) MCG/ACT IN AERS: 2.0000 | INHALATION_SPRAY | Freq: Four times a day (QID) | RESPIRATORY_TRACT | Status: DC | PRN

## 2018-06-02 MED ORDER — HYDRALAZINE HCL 20 MG/ML IJ SOLN
5.0000 mg | INTRAMUSCULAR | Status: DC | PRN
  Administered 2018-06-03: 5 mg via INTRAVENOUS
  Filled 2018-06-02: qty 1

## 2018-06-02 MED ORDER — ENSURE ENLIVE PO LIQD
237.0000 mL | Freq: Two times a day (BID) | ORAL | Status: DC
  Administered 2018-06-03: 237 mL via ORAL

## 2018-06-02 MED ORDER — DEXAMETHASONE SODIUM PHOSPHATE 10 MG/ML IJ SOLN
INTRAMUSCULAR | Status: AC
  Filled 2018-06-02: qty 1

## 2018-06-02 MED ORDER — HEPARIN SODIUM (PORCINE) 1000 UNIT/ML IJ SOLN
INTRAMUSCULAR | Status: DC | PRN
  Administered 2018-06-02: 6000 [IU] via INTRAVENOUS

## 2018-06-02 MED ORDER — LIDOCAINE 2% (20 MG/ML) 5 ML SYRINGE
INTRAMUSCULAR | Status: DC | PRN
  Administered 2018-06-02: 100 mg via INTRAVENOUS

## 2018-06-02 MED ORDER — NICOTINE 21 MG/24HR TD PT24
21.0000 mg | MEDICATED_PATCH | Freq: Every day | TRANSDERMAL | Status: DC
  Administered 2018-06-03: 21 mg via TRANSDERMAL
  Filled 2018-06-02: qty 1

## 2018-06-02 MED ORDER — SODIUM CHLORIDE 0.9 % IV SOLN
INTRAVENOUS | Status: AC
  Filled 2018-06-02: qty 1.2

## 2018-06-02 MED ORDER — 0.9 % SODIUM CHLORIDE (POUR BTL) OPTIME
TOPICAL | Status: DC | PRN
  Administered 2018-06-02: 1000 mL

## 2018-06-02 MED ORDER — ACETAMINOPHEN 325 MG RE SUPP: 325.0000 mg | RECTAL | Status: DC | PRN

## 2018-06-02 MED ORDER — PANTOPRAZOLE SODIUM 40 MG PO TBEC
40.0000 mg | DELAYED_RELEASE_TABLET | Freq: Every day | ORAL | Status: DC
  Administered 2018-06-02 – 2018-06-03 (×2): 40 mg via ORAL
  Filled 2018-06-02 (×2): qty 1

## 2018-06-02 MED ORDER — GUAIFENESIN-DM 100-10 MG/5ML PO SYRP: 15.0000 mL | ORAL_SOLUTION | ORAL | Status: DC | PRN

## 2018-06-02 MED ORDER — SODIUM CHLORIDE 0.9 % IV SOLN: INTRAVENOUS | Status: DC

## 2018-06-02 MED ORDER — CEFAZOLIN SODIUM-DEXTROSE 2-4 GM/100ML-% IV SOLN
2.0000 g | INTRAVENOUS | Status: AC
  Administered 2018-06-02: 2 g via INTRAVENOUS
  Filled 2018-06-02: qty 100

## 2018-06-02 MED ORDER — ROCURONIUM BROMIDE 10 MG/ML (PF) SYRINGE
PREFILLED_SYRINGE | INTRAVENOUS | Status: DC | PRN
  Administered 2018-06-02: 50 mg via INTRAVENOUS

## 2018-06-02 MED ORDER — METOPROLOL TARTRATE 5 MG/5ML IV SOLN: 2.0000 mg | INTRAVENOUS | Status: DC | PRN

## 2018-06-02 SURGICAL SUPPLY — 49 items
ADH SKN CLS APL DERMABOND .7 (GAUZE/BANDAGES/DRESSINGS) ×1
CANISTER SUCT 3000ML PPV (MISCELLANEOUS) ×3 IMPLANT
CATH ROBINSON RED A/P 18FR (CATHETERS) ×3 IMPLANT
CATH SUCT 10FR WHISTLE TIP (CATHETERS) ×3 IMPLANT
CLIP VESOCCLUDE MED 6/CT (CLIP) ×3 IMPLANT
CLIP VESOCCLUDE SM WIDE 6/CT (CLIP) ×3 IMPLANT
COVER PROBE W GEL 5X96 (DRAPES) ×2 IMPLANT
COVER WAND RF STERILE (DRAPES) ×3 IMPLANT
CRADLE DONUT ADULT HEAD (MISCELLANEOUS) ×3 IMPLANT
DERMABOND ADVANCED (GAUZE/BANDAGES/DRESSINGS) ×2
DERMABOND ADVANCED .7 DNX12 (GAUZE/BANDAGES/DRESSINGS) ×1 IMPLANT
DRAIN CHANNEL 15F RND FF W/TCR (WOUND CARE) IMPLANT
ELECT REM PT RETURN 9FT ADLT (ELECTROSURGICAL) ×3
ELECTRODE REM PT RTRN 9FT ADLT (ELECTROSURGICAL) ×1 IMPLANT
EVACUATOR SILICONE 100CC (DRAIN) IMPLANT
GLOVE BIOGEL PI IND STRL 7.5 (GLOVE) ×1 IMPLANT
GLOVE BIOGEL PI INDICATOR 7.5 (GLOVE) ×2
GLOVE SURG SS PI 7.5 STRL IVOR (GLOVE) ×3 IMPLANT
GOWN STRL REUS W/ TWL LRG LVL3 (GOWN DISPOSABLE) ×2 IMPLANT
GOWN STRL REUS W/ TWL XL LVL3 (GOWN DISPOSABLE) ×1 IMPLANT
GOWN STRL REUS W/TWL LRG LVL3 (GOWN DISPOSABLE) ×18
GOWN STRL REUS W/TWL XL LVL3 (GOWN DISPOSABLE) ×3
HEMOSTAT SNOW SURGICEL 2X4 (HEMOSTASIS) ×2 IMPLANT
INSERT FOGARTY SM (MISCELLANEOUS) IMPLANT
KIT BASIN OR (CUSTOM PROCEDURE TRAY) ×3 IMPLANT
KIT SHUNT ARGYLE CAROTID ART 6 (VASCULAR PRODUCTS) IMPLANT
KIT TURNOVER KIT B (KITS) ×3 IMPLANT
NDL HYPO 25GX1X1/2 BEV (NEEDLE) IMPLANT
NEEDLE HYPO 25GX1X1/2 BEV (NEEDLE) IMPLANT
NS IRRIG 1000ML POUR BTL (IV SOLUTION) ×9 IMPLANT
PACK CAROTID (CUSTOM PROCEDURE TRAY) ×3 IMPLANT
PAD ARMBOARD 7.5X6 YLW CONV (MISCELLANEOUS) ×6 IMPLANT
PATCH VASC XENOSURE 1CMX6CM (Vascular Products) ×3 IMPLANT
PATCH VASC XENOSURE 1X6 (Vascular Products) IMPLANT
SHUNT CAROTID BYPASS 10 (VASCULAR PRODUCTS) IMPLANT
SHUNT CAROTID BYPASS 12FRX15.5 (VASCULAR PRODUCTS) IMPLANT
SUT ETHILON 3 0 PS 1 (SUTURE) IMPLANT
SUT PROLENE 5 0 C 1 24 (SUTURE) ×2 IMPLANT
SUT PROLENE 6 0 BV (SUTURE) ×7 IMPLANT
SUT PROLENE 7 0 BV 1 (SUTURE) IMPLANT
SUT PROLENE 7 0 BV1 MDA (SUTURE) ×2 IMPLANT
SUT SILK 3 0 (SUTURE)
SUT SILK 3-0 18XBRD TIE 12 (SUTURE) IMPLANT
SUT VIC AB 3-0 SH 27 (SUTURE) ×6
SUT VIC AB 3-0 SH 27X BRD (SUTURE) ×2 IMPLANT
SUT VICRYL 4-0 PS2 18IN ABS (SUTURE) ×3 IMPLANT
SYR CONTROL 10ML LL (SYRINGE) IMPLANT
TOWEL GREEN STERILE (TOWEL DISPOSABLE) ×3 IMPLANT
WATER STERILE IRR 1000ML POUR (IV SOLUTION) ×3 IMPLANT

## 2018-06-02 NOTE — Progress Notes (Signed)
Pt arrived to 4E24 from PACU. Pt A&Ox4;VSS. Right neck incision clean and dry. Left A-line intact. Tele applied; CCMD notified. CHG complete. Pt resting in bed with call bell within reach. Family at bedside.   Per report from PACU MD aware that Aline pressure is reading significantly higher than cuff pressure. Dr.Early paged to confirm we can disconnect Aline from monitor.  Will follow orders and continue to monitor.  Lilla Shook, BSN

## 2018-06-02 NOTE — Interval H&P Note (Signed)
History and Physical Interval Note:  06/02/2018 10:33 AM  [name]  has presented today for surgery, with the diagnosis of RIGHT CAROTID ARTERY STENOSIS  The various methods of treatment have been discussed with the patient and family. After consideration of risks, benefits and other options for treatment, the patient has consented to  Procedure(s): ENDARTERECTOMY CAROTID RIGHT (Right) as a surgical intervention .  The patient's history has been reviewed, patient examined, no change in status, stable for surgery.  I have reviewed the patient's chart and labs.  Questions were answered to the patient's satisfaction.     Annamarie Major

## 2018-06-02 NOTE — Op Note (Signed)
Patient name: [name] MRN: [id number] DOB: 1932-08-16 Sex: male  06/02/2018 Pre-operative Diagnosis: symptomatic   right carotid stenosis Post-operative diagnosis:  Same Surgeon:  Annamarie Major Assistants:  Leontine Locket, Arlee Muslim Procedure:    right carotid Endarterectomy with bovine pericardial patch angioplasty Anesthesia:  General Blood Loss:  100 Specimens: none  Findings:  95 %stenosis; Thrombus:  none  Indications:  [name] is an 84 year old gentleman who was recently hospitalized for a right brain stroke.  His symptoms included left arm and leg weakness as well as a facial droop.  Carotid ultrasound revealed a high-grade right-sided stenosis.  His symptoms have nearly resolved.  He comes in today for endarterectomy.  Procedure:  The patient was identified in the holding area and taken to [facility]  The patient was then placed supine on the table.   General endotrachial anesthesia was administered.  The patient was prepped and draped in the usual sterile fashion.  A time out was called and antibiotics were administered.  The incision was made along the anterior border of the right sternocleidomastoid muscle.  Cautery was used to dissect through the subcutaneous tissue.  The platysma muscle was divided with cautery.  The internal jugular vein was exposed along its anterior medial border.  The common facial vein was exposed and then divided between 2-0 silk ties and metal clips.  The common carotid artery was then circumferentially exposed and encircled with an umbilical tape.  The vagus nerve was identified and protected.  Next sharp dissection was used to expose the external carotid artery and the superior thyroid artery.  The were encircled with a blue vessel loop and a 2-0 silk tie respectively.  Finally, the internal carotid was carefully dissected free.  An umbilical tape was placed around the internal carotid artery distal to the diseased segment.  The hypoglossal nerve  was visualized throughout and protected.  The patient was given systemic heparinization.  A bovine carotid patch was selected and prepared on the back table.  A 10 french shunt was also prepared.  After blood pressure readings were appropriate and the heparin had been given time to circulate, the internal carotid artery was occluded with a baby Gregory clamp.  The external and common carotid arteries were then occluded with vascular clamps and the 2-0 tie tightened on the superior thyroid artery.  A #11 blade was used to make an arteriotomy in the common carotid artery.  This was extended with Potts scissors along the anterior and lateral border of the common and internal carotid artery.  Approximately 95% stenosis was identified.  There was no thrombus identified.  The 10 french shunt was then placed.  A kleiner kuntz elevator was used to perform endarterectomy.  An eversion endarterectomy was performed in the external carotid artery.  A good distal endpoint was obtained in the internal carotid artery.  The specimen was removed and sent to pathology.  Heparinized saline was used to irrigate the endarterectomized field.  All potential embolic debris was removed.  Bovine pericardial patch angioplasty was then performed using a running 6-0 Prolene. Just prior to completion of the repair, the shunt was removed. The common internal and external carotid arteries were all appropriately flushed. The artery was again irrigated with heparin saline.  The anastomosis was then secured. The clamp was first released on the external carotid artery followed by the common carotid artery approximately 30 seconds later, bloodflow was reestablish through the internal carotid artery.  Next, a hand-held  Doppler was used to evaluate the signals in the common, external, and internal  carotid arteries, all of which had appropriate signals. I then administered  50 mg protamine. The wound was then irrigated.  After hemostasis was  achieved, the carotid sheath was reapproximated with 3-0 Vicryl. The  platysma muscle was reapproximated with running 3-0 Vicryl. The skin  was closed with 4-0 Vicryl. Dermabond was placed on the skin. The  patient was then successfully extubated. His neurologic exam was  similar to his preprocedural exam. The patient was then taken to recovery room  in stable condition. There were no complications.     Disposition:  To PACU in stable condition.  Relevant Operative Details: Extensive plaque within the common carotid and internal carotid artery.  The plaque in the common carotid artery went down below the clavicle.  His plaque was extensive and nearly occlusive at the level of the bifurcation.  I performed endarterectomy.  Because of the length of the disease, and up closing the common carotid artery primarily with 5-0 Prolene, and then used a bovine pericardial patch on the internal carotid and distal common carotid artery.  [name], M.D. [facility] Office: [phone] Pager:  [phone]

## 2018-06-02 NOTE — Progress Notes (Signed)
Pt has right swollen testicle.  Noticed it last night (06-01-18).  Informed Dr. Trula Slade about finding.

## 2018-06-02 NOTE — Anesthesia Procedure Notes (Signed)
Procedure Name: Intubation Date/Time: 06/02/2018 11:17 AM Performed by: Barrington Ellison, CRNA Pre-anesthesia Checklist: Patient identified, Emergency Drugs available, Suction available and Patient being monitored Patient Re-evaluated:Patient Re-evaluated prior to induction Oxygen Delivery Method: Circle System Utilized Preoxygenation: Pre-oxygenation with 100% oxygen Induction Type: IV induction Ventilation: Mask ventilation without difficulty Laryngoscope Size: Mac and 3 Grade View: Grade I Tube type: Oral Tube size: 7.5 mm Number of attempts: 1 Airway Equipment and Method: Stylet and Oral airway Placement Confirmation: ETT inserted through vocal cords under direct vision,  positive ETCO2 and breath sounds checked- equal and bilateral Secured at: 22 cm Tube secured with: Tape Dental Injury: Teeth and Oropharynx as per pre-operative assessment

## 2018-06-02 NOTE — Transfer of Care (Signed)
Immediate Anesthesia Transfer of Care Note  Patient: [name]  Procedure(s) Performed: ENDARTERECTOMY CAROTID RIGHT (Right Neck) PATCH ANGIOPLASTY WITH XENOSURE PATCH (Right Neck)  Patient Location: PACU  Anesthesia Type:General  Level of Consciousness: awake and alert   Airway & Oxygen Therapy: Patient Spontanous Breathing  Post-op Assessment: Report given to RN, Patient moving all extremities X 4 and Patient able to stick tongue midline  Post vital signs: Reviewed and stable  Last Vitals:  Vitals Value Taken Time  BP    Temp    Pulse 71 06/02/2018  1:35 PM  Resp 13 06/02/2018  1:35 PM  SpO2 97 % 06/02/2018  1:35 PM  Vitals shown include unvalidated device data.  Last Pain:  Vitals:   06/02/18 0935  PainSc: 0-No pain      Patients Stated Pain Goal: 2 (53/69/22 3009)  Complications: No apparent anesthesia complications

## 2018-06-02 NOTE — Anesthesia Postprocedure Evaluation (Signed)
Anesthesia Post Note  Patient: Quanta Roher Monnin  Procedure(s) Performed: ENDARTERECTOMY CAROTID RIGHT (Right Neck) PATCH ANGIOPLASTY WITH XENOSURE PATCH (Right Neck)     Patient location during evaluation: PACU Anesthesia Type: General Level of consciousness: awake and alert Pain management: pain level controlled Vital Signs Assessment: post-procedure vital signs reviewed and stable Respiratory status: spontaneous breathing, nonlabored ventilation and respiratory function stable Cardiovascular status: blood pressure returned to baseline and stable Postop Assessment: no apparent nausea or vomiting Anesthetic complications: no    Last Vitals:  Vitals:   06/02/18 0903 06/02/18 1335  BP:  (!) 164/92  Pulse:  71  Resp:  13  Temp: 36.8 C (!) 36.1 C  SpO2:  97%    Last Pain:  Vitals:   06/02/18 1335  PainSc: 0-No pain                 Brennan Bailey

## 2018-06-03 ENCOUNTER — Encounter (HOSPITAL_COMMUNITY): Payer: Self-pay | Admitting: Surgery

## 2018-06-03 ENCOUNTER — Telehealth: Payer: Self-pay | Admitting: Surgery

## 2018-06-03 LAB — BASIC METABOLIC PANEL
Anion gap: 5 (ref 5–15)
BUN: 20 mg/dL (ref 8–23)
CALCIUM: 8.7 mg/dL — AB (ref 8.9–10.3)
CO2: 24 mmol/L (ref 22–32)
CREATININE: 1.23 mg/dL (ref 0.61–1.24)
Chloride: 108 mmol/L (ref 98–111)
GFR calc non Af Amer: 52 mL/min — ABNORMAL LOW (ref >60)
Glucose, Bld: 106 mg/dL — ABNORMAL HIGH (ref 70–99)
Potassium: 3.8 mmol/L (ref 3.5–5.1)
SODIUM: 137 mmol/L (ref 135–145)

## 2018-06-03 LAB — CBC
HCT: 34.2 % — ABNORMAL LOW (ref 39.0–52.0)
Hemoglobin: 11.1 g/dL — ABNORMAL LOW (ref 13.0–17.0)
MCH: 29.8 pg (ref 26.0–34.0)
MCHC: 32.5 g/dL (ref 30.0–36.0)
MCV: 91.9 fL (ref 80.0–100.0)
PLATELETS: 246 10*3/uL (ref 150–400)
RBC: 3.72 MIL/uL — ABNORMAL LOW (ref 4.22–5.81)
RDW: 14.4 % (ref 11.5–15.5)
WBC: 11.5 10*3/uL — AB (ref 4.0–10.5)
nRBC: 0 % (ref 0.0–0.2)

## 2018-06-03 MED ORDER — OXYCODONE-ACETAMINOPHEN 5-325 MG PO TABS: 1.0000 | ORAL_TABLET | ORAL | 0 refills | Status: DC | PRN

## 2018-06-03 NOTE — Telephone Encounter (Signed)
sch appt lvm 06/14/18 1015am p/o MD

## 2018-06-03 NOTE — Telephone Encounter (Signed)
-----   Message from Dagoberto Ligas, PA-C sent at 06/03/2018  7:45 AM EDT -----  Can you schedule an appt for this pt in 2 weeks to see Dr. Trula Slade.  PO R CEA. Thanks, [facility]

## 2018-06-03 NOTE — Discharge Summary (Signed)
Discharge Summary     [name] 04-25-1933 84 y.o. male  [id number]  Admission Date: 06/02/2018  Discharge Date: 06/03/18  Physician: Serafina Mitchell, MD  Admission Diagnosis: RIGHT CAROTID ARTERY STENOSIS  Discharge Day services:   See progress note 06/03/2018 Physical Exam: Vitals:   06/03/18 0811 06/03/18 0912  BP:  (!) 124/111  Pulse:  67  Resp:    Temp:    SpO2: 99%     Hospital Course:  The patient was admitted to the hospital and taken to the operating room on 06/02/2018 and underwent right carotid endarterectomy.  The pt tolerated the procedure well and was transported to the PACU in good condition.   By POD 1, the pt neuro status remained at baseline.  Patient was recently hospitalized with right hemispheric CVA with left arm and leg numbness as well as facial droop.  He did not experience any return of symptoms postoperatively.  The remainder of the hospital course consisted of increasing mobilization and increasing intake of solids without difficulty.  Patient states he has home antihypertensive medications and his blood pressure is usually controlled with these agents.  He will follow-up in our office in 2 weeks to see Dr. Trula Slade.  He will be prescribed 1 to 2 days of narcotic pain medication for continued postoperative pain control.  Discharge instructions were reviewed with the patient and he voices his understanding.  He will be discharged this morning in stable condition.   Recent Labs    06/02/18 0908 06/03/18 0439  NA 136 137  K 4.6 3.8  CL 108 108  CO2 20* 24  GLUCOSE 111* 106*  BUN 19 20  CALCIUM 9.0 8.7*   Recent Labs    06/02/18 0908 06/03/18 0439  WBC 9.5 11.5*  HGB 14.0 11.1*  HCT 44.0 34.2*  PLT 280 246   Recent Labs    06/02/18 0908  INR 1.01       Discharge Diagnosis:  RIGHT CAROTID ARTERY STENOSIS  Secondary Diagnosis: Patient Active Problem List   Diagnosis Date Noted  . Carotid artery stenosis  06/02/2018  . CVA (cerebral vascular accident) ([place]) 05/28/2018  . Stroke (cerebrum) ([place]) 05/27/2018  . Hyponatremia 10/19/2014  . Chest pain 10/19/2014  . HTN (hypertension) 10/19/2014  . COPD (chronic obstructive pulmonary disease) ([place]) 10/19/2014   Past Medical History:  Diagnosis Date  . Arthritis    "shoulders" (06/02/2018)  . Chronic bronchitis ([place])   . Chronic lower back pain   . COPD (chronic obstructive pulmonary disease) ([place])   . GERD (gastroesophageal reflux disease)   . High cholesterol   . Hypertension   . Lesion of lung   . On home oxygen therapy    "1L prn" (06/02/2018)  . Pneumonia    "maybe once" (06/02/2018)  . Skin cancer    "burned off my arms" (06/02/2018)  . Stenosis of right carotid artery   . Stroke ([place]) 05/27/2018   denies residual on 06/02/2018    Allergies as of 06/03/2018   No Known Allergies     Medication List    TAKE these medications   albuterol 108 (90 Base) MCG/ACT inhaler Commonly known as:  PROVENTIL HFA;VENTOLIN HFA Inhale 2 puffs into the lungs every 6 (six) hours as needed for wheezing.   albuterol (2.5 MG/3ML) 0.083% nebulizer solution Commonly known as:  PROVENTIL Take 3 mLs (2.5 mg total) by nebulization every 4 (four) hours as needed for wheezing.   aspirin 325 MG tablet Take 1  tablet (325 mg total) by mouth daily.   atorvastatin 40 MG tablet Commonly known as:  LIPITOR Take 1 tablet (40 mg total) by mouth daily at 6 PM.   losartan 25 MG tablet Commonly known as:  COZAAR Take 1 tablet (25 mg total) by mouth daily.   metoprolol tartrate 50 MG tablet Commonly known as:  LOPRESSOR Take 1 tablet (50 mg total) by mouth 2 (two) times daily.   nicotine 21 mg/24hr patch Commonly known as:  NICODERM CQ - dosed in mg/24 hours Place 1 patch (21 mg total) onto the skin daily.   oxyCODONE-acetaminophen 5-325 MG tablet Commonly known as:  PERCOCET/ROXICET Take 1 tablet by mouth every 4 (four) hours as needed for up to  12 doses for moderate pain.   SYMBICORT 160-4.5 MCG/ACT inhaler Generic drug:  budesonide-formoterol Inhale 2 puffs into the lungs 2 (two) times daily.   tamsulosin 0.4 MG Caps capsule Commonly known as:  FLOMAX Take 0.4 mg by mouth daily.   traMADol 50 MG tablet Commonly known as:  ULTRAM Take 50 mg by mouth 3 (three) times daily.        Discharge Instructions:   [facility] Discharge Instructions Carotid Endarterectomy (CEA)  Please refer to the following instructions for your post-procedure care. Your surgeon or physician assistant will discuss any changes with you.  Activity  You are encouraged to walk as much as you can. You can slowly return to normal activities but must avoid strenuous activity and heavy lifting until your doctor tell you it's OK. Avoid activities such as vacuuming or swinging a golf club. You can drive after one week if you are comfortable and you are no longer taking prescription pain medications. It is normal to feel tired for serval weeks after your surgery. It is also normal to have difficulty with sleep habits, eating, and bowel movements after surgery. These will go away with time.  Bathing/Showering  You may shower after you come home. Do not soak in a bathtub, hot tub, or swim until the incision heals completely.  Incision Care  Shower every day. Clean your incision with mild soap and water. Pat the area dry with a clean towel. You do not need a bandage unless otherwise instructed. Do not apply any ointments or creams to your incision. You may have skin glue on your incision. Do not peel it off. It will come off on its own in about one week. Your incision may feel thickened and raised for several weeks after your surgery. This is normal and the skin will soften over time. For Men Only: It's OK to shave around the incision but do not shave the incision itself for 2 weeks. It is common to have numbness under your chin  that could last for several months.  Diet  Resume your normal diet. There are no special food restrictions following this procedure. A low fat/low cholesterol diet is recommended for all patients with vascular disease. In order to heal from your surgery, it is CRITICAL to get adequate nutrition. Your body requires vitamins, minerals, and protein. Vegetables are the best source of vitamins and minerals. Vegetables also provide the perfect balance of protein. Processed food has little nutritional value, so try to avoid this.  Medications  Resume taking all of your medications unless your doctor or physician assistant tells you not to.  If your incision is causing pain, you may take over-the- counter pain relievers such as acetaminophen (Tylenol). If you were  prescribed a stronger pain medication, please be aware these medications can cause nausea and constipation.  Prevent nausea by taking the medication with a snack or meal. Avoid constipation by drinking plenty of fluids and eating foods with a high amount of fiber, such as fruits, vegetables, and grains. Do not take Tylenol if you are taking prescription pain medications.  Follow Up  Our office will schedule a follow up appointment 2-3 weeks following discharge.  Please call us immediately for any of the following conditions  Increased pain, redness, drainage (pus) from your incision site. Fever of 101 degrees or higher. If you should develop stroke (slurred speech, difficulty swallowing, weakness on one side of your body, loss of vision) you should call 911 and go to the nearest emergency room.  Reduce your risk of vascular disease:  Stop smoking. If you would like help call [facility] at 1-800-QUIT-NOW (780)275-1716) or [place] at 901-331-1683. Manage your cholesterol Maintain a desired weight Control your diabetes Keep your blood pressure down  If you have any questions, please call the office at [phone].   Disposition:  Home  Patient's condition: is Good  Follow up: 1. Dr. Trula Slade in 2 weeks.   Dagoberto Ligas, PA-C [facility] [phone]   --- For [facility] Registry use ---   Modified Rankin score at D/C (0-6): 0  IV medication needed for:  1. Hypertension: Yes 2. Hypotension: No  Post-op Complications: No  1. Post-op CVA or TIA: No  If yes: Event classification (right eye, left eye, right cortical, left cortical, verterobasilar, other):   If yes: Timing of event (intra-op, <6 hrs post-op, >=6 hrs post-op, unknown):   2. CN injury: No  If yes: CN  injuried   3. Myocardial infarction: No  If yes: Dx by (EKG or clinical, Troponin):   4.  CHF: No  5.  Dysrhythmia (new): No  6. Wound infection: No  7. Reperfusion symptoms: No  8. Return to OR: No  If yes: return to OR for (bleeding, neurologic, other CEA incision, other):   Discharge medications: Statin use:  Yes ASA use:  Yes   Beta blocker use:  Yes ACE-Inhibitor use:  No  ARB use:  Yes CCB use: No P2Y12 Antagonist use: No, [ ]  Plavix, [ ]  Plasugrel, [ ]  Ticlopinine, [ ]  Ticagrelor, [ ]  Other, [ ]  No for medical reason, [ ]  Non-compliant, [ ]  Not-indicated Anti-coagulant use:  No, [ ]  Warfarin, [ ]  Rivaroxaban, [ ]  Dabigatran,

## 2018-06-03 NOTE — Discharge Instructions (Signed)
° °  [facility] [place] ° °Discharge Instructions ° °AV Fistula or Graft Surgery for Dialysis Access ° °Please refer to the following instructions for your post-procedure care. Your surgeon or physician assistant will discuss any changes with you. ° °Activity ° °You may drive the day following your surgery, if you are comfortable and no longer taking prescription pain medication. Resume full activity as the soreness in your incision resolves. ° °Bathing/Showering ° °You may shower after you go home. Keep your incision dry for 48 hours. Do not soak in a bathtub, hot tub, or swim until the incision heals completely. You may not shower if you have a hemodialysis catheter. ° °Incision Care ° °Clean your incision with mild soap and water after 48 hours. Pat the area dry with a clean towel. You do not need a bandage unless otherwise instructed. Do not apply any ointments or creams to your incision. You may have skin glue on your incision. Do not peel it off. It will come off on its own in about one week. Your arm may swell a bit after surgery. To reduce swelling use pillows to elevate your arm so it is above your heart. Your doctor will tell you if you need to lightly wrap your arm with an ACE bandage. ° °Diet ° °Resume your normal diet. There are not special food restrictions following this procedure. In order to heal from your surgery, it is CRITICAL to get adequate nutrition. Your body requires vitamins, minerals, and protein. Vegetables are the best source of vitamins and minerals. Vegetables also provide the perfect balance of protein. Processed food has little nutritional value, so try to avoid this. ° °Medications ° °Resume taking all of your medications. If your incision is causing pain, you may take over-the counter pain relievers such as acetaminophen (Tylenol). If you were prescribed a stronger pain medication, please be aware these medications can cause nausea and constipation. Prevent  nausea by taking the medication with a snack or meal. Avoid constipation by drinking plenty of fluids and eating foods with high amount of fiber, such as fruits, vegetables, and grains. Do not take Tylenol if you are taking prescription pain medications. ° ° ° ° °Follow up °Your surgeon may want to see you in the office following your access surgery. If so, this will be arranged at the time of your surgery. ° °Please call us immediately for any of the following conditions: ° °Increased pain, redness, drainage (pus) from your incision site °Fever of 101 degrees or higher °Severe or worsening pain at your incision site °Hand pain or numbness. ° °Reduce your risk of vascular disease: ° °Stop smoking. If you would like help, call [facility] at 1-800-QUIT-NOW (1-800-784-8669) or [place] at 336-586-4000 ° °Manage your cholesterol °Maintain a desired weight °Control your diabetes °Keep your blood pressure down ° °Dialysis ° °It will take several weeks to several months for your new dialysis access to be ready for use. Your surgeon will determine when it is OK to use it. Your nephrologist will continue to direct your dialysis. You can continue to use your Permcath until your new access is ready for use. ° °If you have any questions, please call the office at [phone]. ° °

## 2018-06-03 NOTE — Plan of Care (Signed)
Adequate for discharge.

## 2018-06-03 NOTE — Progress Notes (Signed)
Pt had high BP 184/87 mmHg, HR 62, Labetalol 10 mg given PRN every 10 minutes. Pt was asymptomatic.Will monitor his BP closely.  Kennyth Lose, RN

## 2018-06-03 NOTE — Progress Notes (Signed)
  Progress Note    06/03/2018 7:29 AM 1 Day Post-Op  Subjective:  No return of L arm, leg, or facial weakness   Vitals:   06/03/18 0630 06/03/18 0637  BP: (!) 166/82 (!) 171/83  Pulse: 70   Resp: 20   Temp:    SpO2: 96%    Physical Exam: Lungs:  Non labored Incisions:  R neck incision c/d/i without hematoma Extremities:  Moving all extremities well Abdomen:  Soft Neurologic: A&O  CBC    Component Value Date/Time   WBC 11.5 (H) 06/03/2018 0439   RBC 3.72 (L) 06/03/2018 0439   HGB 11.1 (L) 06/03/2018 0439   HCT 34.2 (L) 06/03/2018 0439   PLT 246 06/03/2018 0439   MCV 91.9 06/03/2018 0439   MCH 29.8 06/03/2018 0439   MCHC 32.5 06/03/2018 0439   RDW 14.4 06/03/2018 0439   LYMPHSABS 1.2 05/26/2018 2240   MONOABS 0.6 05/26/2018 2240   EOSABS 0.1 05/26/2018 2240   BASOSABS 0.1 05/26/2018 2240    BMET    Component Value Date/Time   NA 137 06/03/2018 0439   K 3.8 06/03/2018 0439   CL 108 06/03/2018 0439   CO2 24 06/03/2018 0439   GLUCOSE 106 (H) 06/03/2018 0439   BUN 20 06/03/2018 0439   CREATININE 1.23 06/03/2018 0439   CALCIUM 8.7 (L) 06/03/2018 0439   GFRNONAA 52 (L) 06/03/2018 0439   GFRAA >60 06/03/2018 0439    INR    Component Value Date/Time   INR 1.01 06/02/2018 0908     Intake/Output Summary (Last 24 hours) at 06/03/2018 0729 Last data filed at 06/03/2018 0006 Gross per 24 hour  Intake 2201.94 ml  Output 1075 ml  Net 1126.94 ml     Assessment/Plan:  84 y.o. male is s/p R CEA 1 Day Post-Op   Neuro exam at baseline R neck incision unremarkable BP better control this morning D/c home if ambulating, voiding, and tolerating a diet this morning   Dagoberto Ligas, PA-C [facility] [phone] 06/03/2018 7:29 AM

## 2018-06-14 ENCOUNTER — Encounter: Payer: Medicare Other | Admitting: Surgery

## 2018-06-14 DIAGNOSIS — I639 Cerebral infarction, unspecified: Secondary | ICD-10-CM | POA: Diagnosis not present

## 2018-06-14 DIAGNOSIS — R918 Other nonspecific abnormal finding of lung field: Secondary | ICD-10-CM | POA: Diagnosis not present

## 2018-06-14 DIAGNOSIS — I6521 Occlusion and stenosis of right carotid artery: Secondary | ICD-10-CM | POA: Diagnosis not present

## 2018-06-14 DIAGNOSIS — J449 Chronic obstructive pulmonary disease, unspecified: Secondary | ICD-10-CM | POA: Diagnosis not present

## 2018-06-15 ENCOUNTER — Encounter: Payer: Self-pay | Admitting: Surgery

## 2018-06-28 ENCOUNTER — Telehealth: Payer: Self-pay

## 2018-06-28 ENCOUNTER — Encounter: Payer: Self-pay | Admitting: Adult Health

## 2018-06-28 ENCOUNTER — Ambulatory Visit (INDEPENDENT_AMBULATORY_CARE_PROVIDER_SITE_OTHER): Payer: Self-pay | Admitting: Adult Health

## 2018-06-28 DIAGNOSIS — I63231 Cerebral infarction due to unspecified occlusion or stenosis of right carotid arteries: Secondary | ICD-10-CM

## 2018-06-28 NOTE — Telephone Encounter (Signed)
Patient no show for appt today. 

## 2018-06-28 NOTE — Patient Instructions (Signed)
Continue aspirin 325 mg daily  and Lipitor 40 mg for secondary stroke prevention  Tobacco use  Lung nodules -oncology referral?  Continue to follow-up with Dr. Estanislado Pandy as outpatient for continued monitoring  Continue to follow up with PCP regarding HLD and HTN management   Continue to monitor blood pressure at home  Maintain strict control of hypertension with blood pressure goal below 130/90, diabetes with hemoglobin A1c goal below 6.5% and cholesterol with LDL cholesterol (bad cholesterol) goal below 70 mg/dL. I also advised the patient to eat a healthy diet with plenty of whole grains, cereals, fruits and vegetables, exercise regularly and maintain ideal body weight.  Followup in the future with me in 3 months or call earlier if needed       Thank you for coming to see [place] at [facility]. I hope we have been able to provide you high quality care today.  You may receive a patient satisfaction survey over the next few weeks. We would appreciate your feedback and comments so that we may continue to improve ourselves and the health of our patients.

## 2018-06-28 NOTE — Progress Notes (Signed)
Patient was a no show for appointment  °

## 2018-07-28 DIAGNOSIS — F17200 Nicotine dependence, unspecified, uncomplicated: Secondary | ICD-10-CM | POA: Diagnosis not present

## 2018-07-28 DIAGNOSIS — I10 Essential (primary) hypertension: Secondary | ICD-10-CM | POA: Diagnosis not present

## 2018-07-28 DIAGNOSIS — J449 Chronic obstructive pulmonary disease, unspecified: Secondary | ICD-10-CM | POA: Diagnosis not present

## 2018-08-26 DIAGNOSIS — Z79899 Other long term (current) drug therapy: Secondary | ICD-10-CM | POA: Diagnosis not present

## 2018-08-26 DIAGNOSIS — D649 Anemia, unspecified: Secondary | ICD-10-CM | POA: Diagnosis not present

## 2018-08-26 DIAGNOSIS — R809 Proteinuria, unspecified: Secondary | ICD-10-CM | POA: Diagnosis not present

## 2018-08-26 DIAGNOSIS — N183 Chronic kidney disease, stage 3 (moderate): Secondary | ICD-10-CM | POA: Diagnosis not present

## 2018-08-26 DIAGNOSIS — E559 Vitamin D deficiency, unspecified: Secondary | ICD-10-CM | POA: Diagnosis not present

## 2018-08-26 DIAGNOSIS — I10 Essential (primary) hypertension: Secondary | ICD-10-CM | POA: Diagnosis not present

## 2018-11-22 DIAGNOSIS — I10 Essential (primary) hypertension: Secondary | ICD-10-CM | POA: Diagnosis not present

## 2018-11-22 DIAGNOSIS — M19011 Primary osteoarthritis, right shoulder: Secondary | ICD-10-CM | POA: Diagnosis not present

## 2018-11-22 DIAGNOSIS — J449 Chronic obstructive pulmonary disease, unspecified: Secondary | ICD-10-CM | POA: Diagnosis not present

## 2018-11-22 DIAGNOSIS — M545 Low back pain: Secondary | ICD-10-CM | POA: Diagnosis not present

## 2019-03-10 DIAGNOSIS — M545 Low back pain: Secondary | ICD-10-CM | POA: Diagnosis not present

## 2019-03-10 DIAGNOSIS — I10 Essential (primary) hypertension: Secondary | ICD-10-CM | POA: Diagnosis not present

## 2019-03-10 DIAGNOSIS — F17210 Nicotine dependence, cigarettes, uncomplicated: Secondary | ICD-10-CM | POA: Diagnosis not present

## 2019-03-10 DIAGNOSIS — J449 Chronic obstructive pulmonary disease, unspecified: Secondary | ICD-10-CM | POA: Diagnosis not present

## 2019-03-10 DIAGNOSIS — F17200 Nicotine dependence, unspecified, uncomplicated: Secondary | ICD-10-CM | POA: Diagnosis not present

## 2019-03-11 ENCOUNTER — Other Ambulatory Visit: Payer: Self-pay

## 2019-04-10 DIAGNOSIS — J449 Chronic obstructive pulmonary disease, unspecified: Secondary | ICD-10-CM | POA: Diagnosis not present

## 2019-04-10 DIAGNOSIS — I10 Essential (primary) hypertension: Secondary | ICD-10-CM | POA: Diagnosis not present

## 2019-05-11 DIAGNOSIS — I10 Essential (primary) hypertension: Secondary | ICD-10-CM | POA: Diagnosis not present

## 2019-05-11 DIAGNOSIS — M545 Low back pain: Secondary | ICD-10-CM | POA: Diagnosis not present

## 2019-06-10 DIAGNOSIS — M545 Low back pain: Secondary | ICD-10-CM | POA: Diagnosis not present

## 2019-06-10 DIAGNOSIS — I10 Essential (primary) hypertension: Secondary | ICD-10-CM | POA: Diagnosis not present

## 2019-07-06 ENCOUNTER — Other Ambulatory Visit: Payer: Self-pay

## 2019-07-12 DIAGNOSIS — J449 Chronic obstructive pulmonary disease, unspecified: Secondary | ICD-10-CM | POA: Diagnosis not present

## 2019-07-12 DIAGNOSIS — I10 Essential (primary) hypertension: Secondary | ICD-10-CM | POA: Diagnosis not present

## 2019-07-12 DIAGNOSIS — Z0001 Encounter for general adult medical examination with abnormal findings: Secondary | ICD-10-CM | POA: Diagnosis not present

## 2019-09-08 DIAGNOSIS — I10 Essential (primary) hypertension: Secondary | ICD-10-CM | POA: Diagnosis not present

## 2019-09-08 DIAGNOSIS — J449 Chronic obstructive pulmonary disease, unspecified: Secondary | ICD-10-CM | POA: Diagnosis not present

## 2019-11-06 DIAGNOSIS — M545 Low back pain: Secondary | ICD-10-CM | POA: Diagnosis not present

## 2019-11-06 DIAGNOSIS — I10 Essential (primary) hypertension: Secondary | ICD-10-CM | POA: Diagnosis not present

## 2019-12-02 ENCOUNTER — Encounter (HOSPITAL_COMMUNITY): Payer: Self-pay

## 2019-12-02 ENCOUNTER — Other Ambulatory Visit: Payer: Self-pay

## 2019-12-02 ENCOUNTER — Emergency Department (HOSPITAL_COMMUNITY)
Admission: EM | Admit: 2019-12-02 | Discharge: 2019-12-02 | Disposition: A | Discharge status: Home / Self Care | Payer: Medicare Other | Attending: Emergency Medicine | Admitting: Emergency Medicine

## 2019-12-02 ENCOUNTER — Emergency Department (HOSPITAL_COMMUNITY): Payer: Medicare Other

## 2019-12-02 DIAGNOSIS — S32592A Other specified fracture of left pubis, initial encounter for closed fracture: Secondary | ICD-10-CM | POA: Insufficient documentation

## 2019-12-02 DIAGNOSIS — F17210 Nicotine dependence, cigarettes, uncomplicated: Secondary | ICD-10-CM | POA: Diagnosis not present

## 2019-12-02 DIAGNOSIS — I10 Essential (primary) hypertension: Secondary | ICD-10-CM | POA: Diagnosis not present

## 2019-12-02 DIAGNOSIS — R52 Pain, unspecified: Secondary | ICD-10-CM | POA: Diagnosis not present

## 2019-12-02 DIAGNOSIS — Y9389 Activity, other specified: Secondary | ICD-10-CM | POA: Diagnosis not present

## 2019-12-02 DIAGNOSIS — Y92812 Truck as the place of occurrence of the external cause: Secondary | ICD-10-CM | POA: Diagnosis not present

## 2019-12-02 DIAGNOSIS — W010XXA Fall on same level from slipping, tripping and stumbling without subsequent striking against object, initial encounter: Secondary | ICD-10-CM | POA: Diagnosis not present

## 2019-12-02 DIAGNOSIS — Z7982 Long term (current) use of aspirin: Secondary | ICD-10-CM | POA: Insufficient documentation

## 2019-12-02 DIAGNOSIS — Y999 Unspecified external cause status: Secondary | ICD-10-CM | POA: Diagnosis not present

## 2019-12-02 DIAGNOSIS — J449 Chronic obstructive pulmonary disease, unspecified: Secondary | ICD-10-CM | POA: Diagnosis not present

## 2019-12-02 DIAGNOSIS — W19XXXA Unspecified fall, initial encounter: Secondary | ICD-10-CM | POA: Diagnosis not present

## 2019-12-02 DIAGNOSIS — Z79899 Other long term (current) drug therapy: Secondary | ICD-10-CM | POA: Diagnosis not present

## 2019-12-02 DIAGNOSIS — S79912A Unspecified injury of left hip, initial encounter: Secondary | ICD-10-CM | POA: Diagnosis not present

## 2019-12-02 MED ORDER — OXYCODONE-ACETAMINOPHEN 5-325 MG PO TABS: 1.0000 | ORAL_TABLET | Freq: Two times a day (BID) | ORAL | 0 refills | Status: AC | PRN

## 2019-12-02 NOTE — Clinical Social Work Note (Signed)
Transition of Care [facility]) - Emergency Department Mini Assessment  Patient Details  Name: [name] MRN: [id number] Date of Birth: 09/30/1932  Transition of Care [facility]) CM/SW Contact:    Sherie Don, LCSW Phone Number: 12/02/2019, 9:45 PM  Clinical Narrative: TOC received consult for [facility] due to patient experiencing hip pain from a recent fall. CSW met with patient to discuss [place] options. Patient agreeable to [facility] for [place]. CSW asked about DME. Patient reported he has a cane, walker, and wheelchair at home and does not need additional DME at this time. Referral made to [facility] with [facility]. CSW called patient's granddaughter, Oliver Pila, to ensure patient has transportation home. Granddaughter informed CSW patient's son, Hilda Wexler, is en route to pick up patient. TOC signing off.  ED Mini Assessment: What brought you to the Emergency Department? : Hip pain from a recent fall  Barriers to Discharge: Barriers Resolved  Barrier interventions: Referral for [place] of departure: Car  Interventions which prevented an admission or readmission: [place] or Services  Patient Contact and Communications Key Contact 1: Beryle Beams with [place] with: Oliver Pila (granddaughter) Contact Date: 12/02/19,   Contact time: 2133 Contact Phone Number: [phone] Call outcome: Patient's son, Ercel Pepitone, is coming to pick up patient from ED  Patient states their goals for this hospitalization and ongoing recovery are:: Discharge home with [facility] through [facility].gov Compare Post Acute Care list provided to:: Patient Choice offered to / list presented to : Patient  Admission diagnosis:  hip pain Patient Active Problem List   Diagnosis Date Noted  . Carotid artery stenosis 06/02/2018  . CVA (cerebral vascular accident) ([place]) 05/28/2018  . Stroke (cerebrum) ([place]) 05/27/2018  . Hyponatremia 10/19/2014  . Chest pain 10/19/2014  . HTN  (hypertension) 10/19/2014  . COPD (chronic obstructive pulmonary disease) ([place]) 10/19/2014   PCP:  Fran Lowes, MD (Inactive) Pharmacy:   [facility], [place] [address] Phone: [phone] Fax: [phone]

## 2019-12-02 NOTE — ED Provider Notes (Signed)
[facility] EMERGENCY DEPARTMENT Provider Note   CSN: [id number] Arrival date & time: 12/02/19  1748     History Chief Complaint  Patient presents with  . Hip Pain    [name] is a 86 y.o. male brought in by EMS for evaluation of left hip pain.  Patient reports that about 3 weeks ago, he was getting out of his truck and fell, landing on the asphalt.  He reports landing on his left hip.  He did not hit his head or lose consciousness.  He reports that he was assisted off the ground by a man a woman.  He was able to drive home.  He states since then, he will have difficulty walking.  He reports that he gets up and stands and he is standing for a minute before he feels like his hip gets out.  He has not been able to ambulate as normal.  He normally ambulates without the assistance of a cane or walker.  He denies any numbness/weakness.  Denies any chest pain, difficulty breathing.`  The history is provided by the patient.       Past Medical History:  Diagnosis Date  . Arthritis    "shoulders" (06/02/2018)  . Chronic bronchitis ([place])   . Chronic lower back pain   . COPD (chronic obstructive pulmonary disease) ([place])   . GERD (gastroesophageal reflux disease)   . High cholesterol   . Hypertension   . Lesion of lung   . On home oxygen therapy    "1L prn" (06/02/2018)  . Pneumonia    "maybe once" (06/02/2018)  . Skin cancer    "burned off my arms" (06/02/2018)  . Stenosis of right carotid artery   . Stroke ([place]) 05/27/2018   denies residual on 06/02/2018    Patient Active Problem List   Diagnosis Date Noted  . Carotid artery stenosis 06/02/2018  . CVA (cerebral vascular accident) ([place]) 05/28/2018  . Stroke (cerebrum) ([place]) 05/27/2018  . Hyponatremia 10/19/2014  . Chest pain 10/19/2014  . HTN (hypertension) 10/19/2014  . COPD (chronic obstructive pulmonary disease) ([place]) 10/19/2014    Past Surgical History:  Procedure Laterality Date  . BACK SURGERY    . CAROTID  ENDARTERECTOMY Right 06/02/2018   PATCH ANGIOPLASTY WITH XENOSURE PATCH  . CATARACT EXTRACTION, BILATERAL Bilateral   . COLONOSCOPY     polyps removed  . ENDARTERECTOMY Right 06/02/2018   Procedure: ENDARTERECTOMY CAROTID RIGHT;  Surgeon: Serafina Mitchell, MD;  Location: [facility];  Service: Vascular;  Laterality: Right;  . LUMBAR SPINE SURGERY     "got hurt on the job; broke bone in my lower back" (06/02/2018)  . PATCH ANGIOPLASTY Right 06/02/2018   Procedure: PATCH ANGIOPLASTY WITH Weyman Pedro;  Surgeon: Serafina Mitchell, MD;  [facility];  Service: Vascular;  Laterality: Right;       Family History  Family history unknown: Yes    Social History   Tobacco Use  . Smoking status: Current Every Day Smoker    Packs/day: 1.00    Years: 69.00    Pack years: 69.00    Types: Cigarettes  . Smokeless tobacco: Never Used  Substance Use Topics  . Alcohol use: Not Currently    Comment: none in last 25 years 06/01/18  . Drug use: Not on file    Home Medications Prior to Admission medications   Medication Sig Start Date End Date Taking? Authorizing Provider  albuterol (PROVENTIL HFA;VENTOLIN HFA) 108 (90 BASE) MCG/ACT inhaler Inhale  2 puffs into the lungs every 6 (six) hours as needed for wheezing.   Yes [provider]  albuterol (PROVENTIL) (2.5 MG/3ML) 0.083% nebulizer solution Take 3 mLs (2.5 mg total) by nebulization every 4 (four) hours as needed for wheezing. 01/25/13  Yes Nat Christen, MD  amLODipine (NORVASC) 10 MG tablet Take 10 mg by mouth daily. 11/17/19  Yes [provider]  aspirin 325 MG tablet Take 1 tablet (325 mg total) by mouth daily. 05/31/18  Yes Ghimire, Henreitta Leber, MD  atorvastatin (LIPITOR) 40 MG tablet Take 1 tablet (40 mg total) by mouth daily at 6 PM. 05/30/18  Yes Ghimire, Henreitta Leber, MD  losartan (COZAAR) 100 MG tablet Take 100 mg by mouth daily. 09/16/19  Yes [provider]  metoprolol tartrate (LOPRESSOR) 50 MG tablet Take 1 tablet  (50 mg total) by mouth 2 (two) times daily. 05/30/18  Yes Ghimire, Henreitta Leber, MD  SYMBICORT 160-4.5 MCG/ACT inhaler Inhale 2 puffs into the lungs 2 (two) times daily. 05/17/18  Yes [provider]  traMADol (ULTRAM) 50 MG tablet Take 50 mg by mouth 3 (three) times daily.   Yes [provider]  oxyCODONE-acetaminophen (PERCOCET/ROXICET) 5-325 MG tablet Take 1-2 tablets by mouth every 12 (twelve) hours as needed for severe pain. 12/02/19   Volanda Napoleon, PA-C    Allergies    Patient has no known allergies.  Review of Systems   Review of Systems  Respiratory: Negative for shortness of breath.   Cardiovascular: Negative for chest pain.  Gastrointestinal: Negative for abdominal pain, nausea and vomiting.  Musculoskeletal:       Left hip pain  Neurological: Negative for weakness and numbness.  All other systems reviewed and are negative.   Physical Exam Updated Vital Signs BP (!) 187/75   Pulse 68   Temp 98.3 F (36.8 C) (Oral)   Resp 16   Ht 5\' 11"  (1.803 m)   Wt 57.2 kg   SpO2 98%   BMI 17.57 kg/m   Physical Exam Vitals and nursing note reviewed.  Constitutional:      Appearance: Normal appearance. He is well-developed.  HENT:     Head: Normocephalic and atraumatic.  Eyes:     General: Lids are normal.     Conjunctiva/sclera: Conjunctivae normal.     Pupils: Pupils are equal, round, and reactive to light.  Neck:     Comments: Full flexion/extension and lateral movement of neck fully intact. No bony midline tenderness. No deformities or crepitus.  Cardiovascular:     Rate and Rhythm: Normal rate and regular rhythm.     Pulses: Normal pulses.          Dorsalis pedis pulses are 2+ on the right side and 2+ on the left side.     Heart sounds: Normal heart sounds. No murmur. No friction rub. No gallop.   Pulmonary:     Effort: Pulmonary effort is normal.     Breath sounds: Normal breath sounds.  Abdominal:     Palpations: Abdomen is soft. Abdomen is not  rigid.     Tenderness: There is no abdominal tenderness. There is no guarding.  Genitourinary:    Comments: The exam was performed with a chaperone present.  Right-sided scrotal swelling which patient states is normal. Musculoskeletal:        General: Normal range of motion.     Cervical back: Full passive range of motion without pain.     Comments: Palpation on left hip.  No Tatian or shortening.  No bony tenderness noted to left femur, left knee, left tib-fib, left ankle.  Flexion/tension of full internal and external rotation of hip intact without any difficulty.  No tenderness palpation of the right lower extremity.  No midline T or L-spine tenderness noted.  No deformity or crepitus noted.  Skin:    General: Skin is warm and dry.     Capillary Refill: Capillary refill takes less than 2 seconds.  Neurological:     Mental Status: He is alert and oriented to person, place, and time.  Psychiatric:        Speech: Speech normal.     ED Results / Procedures / Treatments   Labs (all labs ordered are listed, but only abnormal results are displayed) Labs Reviewed - No data to display  EKG None  Radiology CT PELVIS WO CONTRAST  Result Date: 12/02/2019 CLINICAL DATA:  Status post recent fall. EXAM: CT PELVIS WITHOUT CONTRAST TECHNIQUE: Multidetector CT imaging of the pelvis was performed following the standard protocol without intravenous contrast. COMPARISON:  None. FINDINGS: Urinary Tract: The right kidney is atrophic in appearance. Mild compensatory hypertrophy of the left kidney is seen. The urinary bladder is normal in appearance. Bowel: There is no evidence of bowel dilatation. Noninflamed diverticula are seen throughout the sigmoid colon. Vascular/Lymphatic: No pathologically enlarged lymph nodes. There is marked severity calcification of the abdominal aorta and bilateral common iliac arteries, without evidence of aneurysmal dilatation. Reproductive:  The prostate gland is moderately  enlarged. Other: There is a 6.4 cm x 3.0 cm right inguinal hernia is seen. This contains fat and nondilated loops of distal small bowel and extends inferiorly into the scrotum on the right. Musculoskeletal: The acute, nondisplaced fracture deformity is seen involving the left inferior pubic ramus. Multilevel degenerative changes seen throughout the visualized portion of the lumbar spine. IMPRESSION: 1. Acute, nondisplaced fracture deformity of the left inferior pubic ramus. 2. Large right inguinal hernia which contains fat and nondilated loops of distal small bowel and extends inferiorly into the scrotum on the right. 3. Sigmoid diverticulosis. Aortic Atherosclerosis (ICD10-I70.0). Electronically Signed   By: Virgina Norfolk M.D.   On: 12/02/2019 20:27   DG Hip Unilat W or Wo Pelvis 2-3 Views Left  Result Date: 12/02/2019 CLINICAL DATA:  Left hip pain. Fall 3 weeks ago. Pain since that time with painful weight-bearing. EXAM: DG HIP (WITH OR WITHOUT PELVIS) 2-3V LEFT COMPARISON:  None. FINDINGS: Cortical irregularity of the left inferior pubic ramus is equivocal for fracture. No proximal femur fracture. Femoral head is seated in the acetabulum. Pubic symphysis and sacroiliac joints are congruent. Advanced aorto bi-iliac atherosclerosis. IMPRESSION: Cortical irregularity of the left inferior pubic ramus, suspicious for fracture. Confirmation with cross-sectional imaging could be considered. Electronically Signed   By: Keith Rake M.D.   On: 12/02/2019 19:22    Procedures Procedures (including critical care time)  Medications Ordered in ED Medications - No data to display  ED Course  I have reviewed the triage vital signs and the nursing notes.  Pertinent labs & imaging results that were available during my care of the patient were reviewed by me and considered in my medical decision making (see chart for details).    MDM Rules/Calculators/A&P                      86 y.o. M who presents for  evaluation of left hip pain.  Reports he had a ground-level fall about 3  weeks ago landed on his hip.  No head injury, LOC.  He reports that since then, he has had pain in the left hip.  He states that when he gets up, he has pain in his left hip and feels like his hip gets out.  No numbness/weakness. Patient is afebrile, non-toxic appearing, sitting comfortably on examination table. Vital signs reviewed and stable.  Patient is neurovascularly intact.  He does have some tenderness noted to the left hip but has full range of motion with any difficulty.  Concern for fracture.  History/physical exam a concern for ischemic limb.  Plan for x-ray imaging.  XR shows cortical irregularly of the left inferior pubic ramus, suspicious for fracture.  Plan for CT pelvis for evaluation.   CT scan shows acute nondisplaced fracture deformity of the left inferior pubic ramus.  There is a large right inguinal hernia which contains fat and nondilated loops of distal small bowel and extends inferiorly into the scrotum on the right.  Sigmoid diverticulosis noted.  Discussed with Dr. Aline Brochure (Ortho).  Agrees with plan for weightbearing as tolerated.  We will plan for outpatient follow-up.  Home health ordered for patient with PT OT.  At this time, given the duration of symptoms, feel that patient be really discharged with pain medication and home health needs. At this time, patient exhibits no emergent life-threatening condition that require further evaluation in ED or admission. Discussed patient with Dr. Sabra Heck who is agreeable to plan. Patient had ample opportunity for questions and discussion. All patient's questions were answered with full understanding. Strict return precautions discussed. Patient expresses understanding and agreement to plan.   Portions of this note were generated with [employer]. Dictation errors may occur despite best attempts at proofreading.   Final Clinical Impression(s) / ED  Diagnoses Final diagnoses:  Pubic ramus fracture, left, closed, initial encounter ([place])    Rx / DC Orders ED Discharge Orders         Ordered    Face-to-face encounter (required for Medicare/Medicaid patients)    Comments: I Volanda Napoleon certify that this patient is under my care and that I, or a nurse practitioner or physician's assistant working with me, had a face-to-face encounter that meets the physician face-to-face encounter requirements with this patient on 12/02/2019. The encounter with the patient was in whole, or in part for the following medical condition(s) which is the primary reason for home health care (List medical condition): Inferior pubic rami fracture   12/02/19 2154    oxyCODONE-acetaminophen (PERCOCET/ROXICET) 5-325 MG tablet  Every 12 hours PRN     12/02/19 2203           Desma Mcgregor 12/02/19 2308    Noemi Chapel, MD 12/03/19 [phone]

## 2019-12-02 NOTE — Discharge Instructions (Signed)
You can take 1000 mg of Tylenol.  Do not exceed 4000 mg of Tylenol a day.  Take pain medications as directed for break through pain. Do not drive or operate machinery while taking this medication.   You should not take this pain medication while you are by yourself as sometimes it can cause you to feel drowsy and you may fall.  You should use a walker to help with walking.  You can walk as much as you can tolerate it.  Please call Dr. Ruthe Mannan office to arrange for an appointment.  Return to the emergency department for any worsening pain, swelling of the leg, numbness/weakness or any other worsening concerning symptoms.

## 2019-12-02 NOTE — ED Triage Notes (Signed)
Pt brought in by EMS . Pt fell 3 weeks ago while out paying his power bill. Pt reports tripping. Reports he has not been able to bear weight and hasn't ambulated well since. Reports pain in left side

## 2019-12-05 DIAGNOSIS — G8929 Other chronic pain: Secondary | ICD-10-CM | POA: Diagnosis not present

## 2019-12-05 DIAGNOSIS — I10 Essential (primary) hypertension: Secondary | ICD-10-CM | POA: Diagnosis not present

## 2019-12-05 DIAGNOSIS — Z8673 Personal history of transient ischemic attack (TIA), and cerebral infarction without residual deficits: Secondary | ICD-10-CM | POA: Diagnosis not present

## 2019-12-05 DIAGNOSIS — J449 Chronic obstructive pulmonary disease, unspecified: Secondary | ICD-10-CM | POA: Diagnosis not present

## 2019-12-05 DIAGNOSIS — K4090 Unilateral inguinal hernia, without obstruction or gangrene, not specified as recurrent: Secondary | ICD-10-CM | POA: Diagnosis not present

## 2019-12-05 DIAGNOSIS — S32592D Other specified fracture of left pubis, subsequent encounter for fracture with routine healing: Secondary | ICD-10-CM | POA: Diagnosis not present

## 2019-12-05 DIAGNOSIS — M19011 Primary osteoarthritis, right shoulder: Secondary | ICD-10-CM | POA: Diagnosis not present

## 2019-12-05 DIAGNOSIS — K219 Gastro-esophageal reflux disease without esophagitis: Secondary | ICD-10-CM | POA: Diagnosis not present

## 2019-12-05 DIAGNOSIS — Z85828 Personal history of other malignant neoplasm of skin: Secondary | ICD-10-CM | POA: Diagnosis not present

## 2019-12-05 DIAGNOSIS — M19012 Primary osteoarthritis, left shoulder: Secondary | ICD-10-CM | POA: Diagnosis not present

## 2019-12-05 DIAGNOSIS — Z9981 Dependence on supplemental oxygen: Secondary | ICD-10-CM | POA: Diagnosis not present

## 2019-12-05 DIAGNOSIS — H9190 Unspecified hearing loss, unspecified ear: Secondary | ICD-10-CM | POA: Diagnosis not present

## 2019-12-05 DIAGNOSIS — F17210 Nicotine dependence, cigarettes, uncomplicated: Secondary | ICD-10-CM | POA: Diagnosis not present

## 2019-12-05 DIAGNOSIS — I700 Atherosclerosis of aorta: Secondary | ICD-10-CM | POA: Diagnosis not present

## 2019-12-05 DIAGNOSIS — E7800 Pure hypercholesterolemia, unspecified: Secondary | ICD-10-CM | POA: Diagnosis not present

## 2019-12-05 DIAGNOSIS — M545 Low back pain: Secondary | ICD-10-CM | POA: Diagnosis not present

## 2019-12-05 DIAGNOSIS — K5730 Diverticulosis of large intestine without perforation or abscess without bleeding: Secondary | ICD-10-CM | POA: Diagnosis not present

## 2019-12-05 DIAGNOSIS — Z86010 Personal history of colonic polyps: Secondary | ICD-10-CM | POA: Diagnosis not present

## 2019-12-05 DIAGNOSIS — Z9181 History of falling: Secondary | ICD-10-CM | POA: Diagnosis not present

## 2019-12-07 DIAGNOSIS — I10 Essential (primary) hypertension: Secondary | ICD-10-CM | POA: Diagnosis not present

## 2019-12-07 DIAGNOSIS — M19011 Primary osteoarthritis, right shoulder: Secondary | ICD-10-CM | POA: Diagnosis not present

## 2019-12-08 DIAGNOSIS — M19011 Primary osteoarthritis, right shoulder: Secondary | ICD-10-CM | POA: Diagnosis not present

## 2019-12-08 DIAGNOSIS — G8929 Other chronic pain: Secondary | ICD-10-CM | POA: Diagnosis not present

## 2019-12-08 DIAGNOSIS — M19012 Primary osteoarthritis, left shoulder: Secondary | ICD-10-CM | POA: Diagnosis not present

## 2019-12-08 DIAGNOSIS — S32592D Other specified fracture of left pubis, subsequent encounter for fracture with routine healing: Secondary | ICD-10-CM | POA: Diagnosis not present

## 2019-12-08 DIAGNOSIS — J449 Chronic obstructive pulmonary disease, unspecified: Secondary | ICD-10-CM | POA: Diagnosis not present

## 2019-12-08 DIAGNOSIS — M545 Low back pain: Secondary | ICD-10-CM | POA: Diagnosis not present

## 2019-12-09 DIAGNOSIS — G8929 Other chronic pain: Secondary | ICD-10-CM | POA: Diagnosis not present

## 2019-12-09 DIAGNOSIS — J449 Chronic obstructive pulmonary disease, unspecified: Secondary | ICD-10-CM | POA: Diagnosis not present

## 2019-12-09 DIAGNOSIS — M19011 Primary osteoarthritis, right shoulder: Secondary | ICD-10-CM | POA: Diagnosis not present

## 2019-12-09 DIAGNOSIS — S32592D Other specified fracture of left pubis, subsequent encounter for fracture with routine healing: Secondary | ICD-10-CM | POA: Diagnosis not present

## 2019-12-09 DIAGNOSIS — M545 Low back pain: Secondary | ICD-10-CM | POA: Diagnosis not present

## 2019-12-09 DIAGNOSIS — M19012 Primary osteoarthritis, left shoulder: Secondary | ICD-10-CM | POA: Diagnosis not present

## 2019-12-13 ENCOUNTER — Other Ambulatory Visit: Payer: Self-pay

## 2019-12-13 ENCOUNTER — Encounter (HOSPITAL_COMMUNITY): Payer: Self-pay | Admitting: Emergency Medicine

## 2019-12-13 ENCOUNTER — Emergency Department (HOSPITAL_COMMUNITY): Payer: Medicare Other

## 2019-12-13 ENCOUNTER — Emergency Department (HOSPITAL_COMMUNITY)
Admission: EM | Admit: 2019-12-13 | Discharge: 2019-12-13 | Disposition: A | Discharge status: Home / Self Care | Payer: Medicare Other | Attending: Emergency Medicine | Admitting: Emergency Medicine

## 2019-12-13 DIAGNOSIS — E440 Moderate protein-calorie malnutrition: Secondary | ICD-10-CM

## 2019-12-13 DIAGNOSIS — R05 Cough: Secondary | ICD-10-CM | POA: Diagnosis not present

## 2019-12-13 DIAGNOSIS — R918 Other nonspecific abnormal finding of lung field: Secondary | ICD-10-CM | POA: Diagnosis not present

## 2019-12-13 DIAGNOSIS — Z7982 Long term (current) use of aspirin: Secondary | ICD-10-CM | POA: Insufficient documentation

## 2019-12-13 DIAGNOSIS — Z85828 Personal history of other malignant neoplasm of skin: Secondary | ICD-10-CM | POA: Diagnosis not present

## 2019-12-13 DIAGNOSIS — R634 Abnormal weight loss: Secondary | ICD-10-CM | POA: Diagnosis not present

## 2019-12-13 DIAGNOSIS — J449 Chronic obstructive pulmonary disease, unspecified: Secondary | ICD-10-CM | POA: Diagnosis not present

## 2019-12-13 DIAGNOSIS — R531 Weakness: Secondary | ICD-10-CM | POA: Diagnosis not present

## 2019-12-13 DIAGNOSIS — E860 Dehydration: Secondary | ICD-10-CM | POA: Diagnosis not present

## 2019-12-13 DIAGNOSIS — I959 Hypotension, unspecified: Secondary | ICD-10-CM | POA: Diagnosis not present

## 2019-12-13 DIAGNOSIS — Z8673 Personal history of transient ischemic attack (TIA), and cerebral infarction without residual deficits: Secondary | ICD-10-CM | POA: Insufficient documentation

## 2019-12-13 DIAGNOSIS — Z9981 Dependence on supplemental oxygen: Secondary | ICD-10-CM | POA: Diagnosis not present

## 2019-12-13 DIAGNOSIS — K449 Diaphragmatic hernia without obstruction or gangrene: Secondary | ICD-10-CM | POA: Diagnosis not present

## 2019-12-13 DIAGNOSIS — R0602 Shortness of breath: Secondary | ICD-10-CM | POA: Insufficient documentation

## 2019-12-13 DIAGNOSIS — F17210 Nicotine dependence, cigarettes, uncomplicated: Secondary | ICD-10-CM | POA: Diagnosis not present

## 2019-12-13 DIAGNOSIS — I10 Essential (primary) hypertension: Secondary | ICD-10-CM | POA: Insufficient documentation

## 2019-12-13 DIAGNOSIS — R079 Chest pain, unspecified: Secondary | ICD-10-CM | POA: Diagnosis not present

## 2019-12-13 DIAGNOSIS — R0902 Hypoxemia: Secondary | ICD-10-CM | POA: Diagnosis not present

## 2019-12-13 DIAGNOSIS — R0789 Other chest pain: Secondary | ICD-10-CM | POA: Diagnosis not present

## 2019-12-13 DIAGNOSIS — R911 Solitary pulmonary nodule: Secondary | ICD-10-CM | POA: Diagnosis not present

## 2019-12-13 LAB — COMPREHENSIVE METABOLIC PANEL
ALT: 11 U/L (ref 0–44)
AST: 13 U/L — ABNORMAL LOW (ref 15–41)
Albumin: 3.2 g/dL — ABNORMAL LOW (ref 3.5–5.0)
Alkaline Phosphatase: 115 U/L (ref 38–126)
Anion gap: 11 (ref 5–15)
BUN: 35 mg/dL — ABNORMAL HIGH (ref 8–23)
CO2: 20 mmol/L — ABNORMAL LOW (ref 22–32)
Calcium: 8.7 mg/dL — ABNORMAL LOW (ref 8.9–10.3)
Chloride: 107 mmol/L (ref 98–111)
Creatinine, Ser: 1.19 mg/dL (ref 0.61–1.24)
GFR calc Af Amer: >60 mL/min (ref >60)
GFR calc non Af Amer: 56 mL/min — ABNORMAL LOW (ref >60)
Glucose, Bld: 121 mg/dL — ABNORMAL HIGH (ref 70–99)
Potassium: 3.9 mmol/L (ref 3.5–5.1)
Sodium: 138 mmol/L (ref 135–145)
Total Bilirubin: 1 mg/dL (ref 0.3–1.2)
Total Protein: 6.2 g/dL — ABNORMAL LOW (ref 6.5–8.1)

## 2019-12-13 LAB — CBC
HCT: 39.8 % (ref 39.0–52.0)
Hemoglobin: 12.7 g/dL — ABNORMAL LOW (ref 13.0–17.0)
MCH: 29.1 pg (ref 26.0–34.0)
MCHC: 31.9 g/dL (ref 30.0–36.0)
MCV: 91.1 fL (ref 80.0–100.0)
Platelets: 235 10*3/uL (ref 150–400)
RBC: 4.37 MIL/uL (ref 4.22–5.81)
RDW: 15.2 % (ref 11.5–15.5)
WBC: 10.5 10*3/uL (ref 4.0–10.5)
nRBC: 0 % (ref 0.0–0.2)

## 2019-12-13 MED ORDER — IOHEXOL 300 MG/ML  SOLN
100.0000 mL | Freq: Once | INTRAMUSCULAR | Status: AC | PRN
  Administered 2019-12-13: 100 mL via INTRAVENOUS

## 2019-12-13 MED ORDER — SODIUM CHLORIDE 0.9 % IV BOLUS
500.0000 mL | Freq: Once | INTRAVENOUS | Status: AC
  Administered 2019-12-13: 10:00:00 500 mL via INTRAVENOUS

## 2019-12-13 NOTE — Discharge Instructions (Addendum)
It was our pleasure to provide your ER care today - we hope that you feel better.  The lung mass on your CT scan is concerning for lung cancer - follow up with your doctor and oncologist in the next 1-2 weeks - call office today to arrange follow up - have them reviewed your scans.   Drink plenty of fluids. Stay active. Get adequate nutrition - if appetite poor, consider supplementing your nutrition with Boost, Ensure, or other nutritious shake.   Return to ER if worse, new symptoms, fevers, new or severe pain, trouble breathing, fainting, or other concern.

## 2019-12-13 NOTE — ED Notes (Signed)
Pt back from x-ray.

## 2019-12-13 NOTE — ED Provider Notes (Signed)
[facility] Provider Note   CSN: [id number] Arrival date & time: 12/13/19  [id number]     History Chief Complaint  Patient presents with  . Weakness    [name] is a 86 y.o. male.  Patient with hx copd, presents with generalized weakness for the past couple weeks. Symptoms gradual onset, constant, persistent, slowly worse. No focal or unilateral numbness/weakness. No change in speech or vision. Denies acute chest pain, or exertional cp, although states right chest soreness every morning 'for a long while'. No pleuritic pain. No leg pain or swelling. +non prod cough. Mild sob. EMS indicates initial bp 90 - post ns bolus pt reports feeling improved. +unspecified wt loss. +smoker. No recent change in meds. No fevers. No known covid exposure.   The history is provided by the patient and the EMS personnel.  Weakness Associated symptoms: cough and shortness of breath   Associated symptoms: no abdominal pain, no dysuria, no fever, no headaches and no vomiting        Past Medical History:  Diagnosis Date  . Arthritis    "shoulders" (06/02/2018)  . Chronic bronchitis ([place])   . Chronic lower back pain   . COPD (chronic obstructive pulmonary disease) ([place])   . GERD (gastroesophageal reflux disease)   . High cholesterol   . Hypertension   . Lesion of lung   . On home oxygen therapy    "1L prn" (06/02/2018)  . Pneumonia    "maybe once" (06/02/2018)  . Skin cancer    "burned off my arms" (06/02/2018)  . Stenosis of right carotid artery   . Stroke ([place]) 05/27/2018   denies residual on 06/02/2018    Patient Active Problem List   Diagnosis Date Noted  . Carotid artery stenosis 06/02/2018  . CVA (cerebral vascular accident) ([place]) 05/28/2018  . Stroke (cerebrum) ([place]) 05/27/2018  . Hyponatremia 10/19/2014  . Chest pain 10/19/2014  . HTN (hypertension) 10/19/2014  . COPD (chronic obstructive pulmonary disease) ([place]) 10/19/2014    Past Surgical History:    Procedure Laterality Date  . BACK SURGERY    . CAROTID ENDARTERECTOMY Right 06/02/2018   PATCH ANGIOPLASTY WITH XENOSURE PATCH  . CATARACT EXTRACTION, BILATERAL Bilateral   . COLONOSCOPY     polyps removed  . ENDARTERECTOMY Right 06/02/2018   Procedure: ENDARTERECTOMY CAROTID RIGHT;  Surgeon: Serafina Mitchell, MD;  Location: [facility];  Service: Vascular;  Laterality: Right;  . LUMBAR SPINE SURGERY     "got hurt on the job; broke bone in my lower back" (06/02/2018)  . PATCH ANGIOPLASTY Right 06/02/2018   Procedure: PATCH ANGIOPLASTY WITH Weyman Pedro;  Surgeon: Serafina Mitchell, MD;  Location: [facility] OR;  Service: Vascular;  Laterality: Right;       Family History  Family history unknown: Yes    Social History   Tobacco Use  . Smoking status: Current Every Day Smoker    Packs/day: 1.00    Years: 69.00    Pack years: 69.00    Types: Cigarettes  . Smokeless tobacco: Never Used  Substance Use Topics  . Alcohol use: Not Currently    Comment: none in last 25 years 06/01/18  . Drug use: Never    Home Medications Prior to Admission medications   Medication Sig Start Date End Date Taking? Authorizing Provider  albuterol (PROVENTIL HFA;VENTOLIN HFA) 108 (90 BASE) MCG/ACT inhaler Inhale 2 puffs into the lungs every 6 (six) hours as needed for wheezing.    [provider]  albuterol (PROVENTIL) (2.5 MG/3ML) 0.083% nebulizer solution Take 3 mLs (2.5 mg total) by nebulization every 4 (four) hours as needed for wheezing. 01/25/13   Nat Christen, MD  amLODipine (NORVASC) 10 MG tablet Take 10 mg by mouth daily. 11/17/19   [provider]  aspirin 325 MG tablet Take 1 tablet (325 mg total) by mouth daily. 05/31/18   Ghimire, Henreitta Leber, MD  atorvastatin (LIPITOR) 40 MG tablet Take 1 tablet (40 mg total) by mouth daily at 6 PM. 05/30/18   Ghimire, Henreitta Leber, MD  losartan (COZAAR) 100 MG tablet Take 100 mg by mouth daily. 09/16/19   [provider]  metoprolol tartrate  (LOPRESSOR) 50 MG tablet Take 1 tablet (50 mg total) by mouth 2 (two) times daily. 05/30/18   Ghimire, Henreitta Leber, MD  oxyCODONE-acetaminophen (PERCOCET/ROXICET) 5-325 MG tablet Take 1-2 tablets by mouth every 12 (twelve) hours as needed for severe pain. 12/02/19   Volanda Napoleon, PA-C  SYMBICORT 160-4.5 MCG/ACT inhaler Inhale 2 puffs into the lungs 2 (two) times daily. 05/17/18   [provider]  traMADol (ULTRAM) 50 MG tablet Take 50 mg by mouth 3 (three) times daily.    [provider]    Allergies    Patient has no known allergies.  Review of Systems   Review of Systems  Constitutional: Positive for appetite change. Negative for fever.  HENT: Negative for sore throat.   Eyes: Negative for redness.  Respiratory: Positive for cough and shortness of breath.   Cardiovascular: Negative for leg swelling.  Gastrointestinal: Negative for abdominal pain and vomiting.  Endocrine: Negative for polyuria.  Genitourinary: Negative for dysuria and flank pain.  Musculoskeletal: Negative for back pain and neck pain.  Skin: Negative for rash.  Neurological: Positive for weakness. Negative for headaches.  Hematological: Does not bruise/bleed easily.  Psychiatric/Behavioral: Negative for confusion.    Physical Exam Updated Vital Signs BP (!) 146/80 (BP Location: Right Arm)   Pulse 82   Temp 98.3 F (36.8 C) (Oral)   Resp 18   Ht 1.803 m (5\' 11" )   Wt 56.7 kg   SpO2 98%   BMI 17.43 kg/m   Physical Exam Vitals and nursing note reviewed.  Constitutional:      Appearance: He is well-developed.     Comments: Very thin/frail appearing.   HENT:     Head: Atraumatic.     Nose: Nose normal.     Mouth/Throat:     Mouth: Mucous membranes are moist.     Pharynx: Oropharynx is clear.  Eyes:     General: No scleral icterus.    Conjunctiva/sclera: Conjunctivae normal.     Pupils: Pupils are equal, round, and reactive to light.  Neck:     Vascular: No carotid bruit.      Trachea: No tracheal deviation.     Comments: Thyroid not grossly enlarged or tender.  Cardiovascular:     Rate and Rhythm: Normal rate and regular rhythm.     Pulses: Normal pulses.     Heart sounds: Normal heart sounds. No murmur. No friction rub. No gallop.   Pulmonary:     Effort: Pulmonary effort is normal. No accessory muscle usage or respiratory distress.     Breath sounds: Normal breath sounds.  Abdominal:     General: Bowel sounds are normal. There is no distension.     Palpations: Abdomen is soft.     Tenderness: There is no abdominal tenderness. There is no guarding.  Genitourinary:  Comments: No cva tenderness. Musculoskeletal:        General: No swelling or tenderness.     Cervical back: Normal range of motion and neck supple. No rigidity.  Skin:    General: Skin is warm and dry.     Findings: No rash.  Neurological:     Mental Status: He is alert.     Comments: Alert, speech clear. Motor/sens grossly intact bil.   Psychiatric:        Mood and Affect: Mood normal.     ED Results / Procedures / Treatments   Labs (all labs ordered are listed, but only abnormal results are displayed) Results for orders placed or performed during the hospital encounter of 12/13/19  CBC  Result Value Ref Range   WBC 10.5 4.0 - 10.5 K/uL   RBC 4.37 4.22 - 5.81 MIL/uL   Hemoglobin 12.7 (L) 13.0 - 17.0 g/dL   HCT 39.8 39.0 - 52.0 %   MCV 91.1 80.0 - 100.0 fL   MCH 29.1 26.0 - 34.0 pg   MCHC 31.9 30.0 - 36.0 g/dL   RDW 15.2 11.5 - 15.5 %   Platelets 235 150 - 400 K/uL   nRBC 0.0 0.0 - 0.2 %  Comprehensive metabolic panel  Result Value Ref Range   Sodium 138 135 - 145 mmol/L   Potassium 3.9 3.5 - 5.1 mmol/L   Chloride 107 98 - 111 mmol/L   CO2 20 (L) 22 - 32 mmol/L   Glucose, Bld 121 (H) 70 - 99 mg/dL   BUN 35 (H) 8 - 23 mg/dL   Creatinine, Ser 1.19 0.61 - 1.24 mg/dL   Calcium 8.7 (L) 8.9 - 10.3 mg/dL   Total Protein 6.2 (L) 6.5 - 8.1 g/dL   Albumin 3.2 (L) 3.5 - 5.0 g/dL     AST 13 (L) 15 - 41 U/L   ALT 11 0 - 44 U/L   Alkaline Phosphatase 115 38 - 126 U/L   Total Bilirubin 1.0 0.3 - 1.2 mg/dL   GFR calc non Af Amer 56 (L) >60 mL/min   GFR calc Af Amer >60 >60 mL/min   Anion gap 11 5 - 15   DG Chest 2 View  Result Date: 12/13/2019 CLINICAL DATA:  Weakness. EXAM: CHEST - 2 VIEW COMPARISON:  May 27, 2018. May 29, 2018. FINDINGS: The heart size and mediastinal contours are within normal limits. No pneumothorax pleural effusion is noted. Hyperinflation of the lungs is noted. Atherosclerosis of thoracic aorta is noted. Increased right apical opacity is noted suggesting enlarging mass as noted on prior CT scan. The visualized skeletal structures are unremarkable. IMPRESSION: Increased right apical opacity is noted suggesting enlarging mass as noted on prior CT scan. CT scan is recommended for further evaluation. Hyperinflation of the lungs. Aortic Atherosclerosis (ICD10-I70.0). Electronically Signed   By: Marijo Conception M.D.   On: 12/13/2019 11:10   CT PELVIS WO CONTRAST  Result Date: 12/02/2019 CLINICAL DATA:  Status post recent fall. EXAM: CT PELVIS WITHOUT CONTRAST TECHNIQUE: Multidetector CT imaging of the pelvis was performed following the standard protocol without intravenous contrast. COMPARISON:  None. FINDINGS: Urinary Tract: The right kidney is atrophic in appearance. Mild compensatory hypertrophy of the left kidney is seen. The urinary bladder is normal in appearance. Bowel: There is no evidence of bowel dilatation. Noninflamed diverticula are seen throughout the sigmoid colon. Vascular/Lymphatic: No pathologically enlarged lymph nodes. There is marked severity calcification of the abdominal aorta and bilateral common iliac arteries,  without evidence of aneurysmal dilatation. Reproductive:  The prostate gland is moderately enlarged. Other: There is a 6.4 cm x 3.0 cm right inguinal hernia is seen. This contains fat and nondilated loops of distal small bowel  and extends inferiorly into the scrotum on the right. Musculoskeletal: The acute, nondisplaced fracture deformity is seen involving the left inferior pubic ramus. Multilevel degenerative changes seen throughout the visualized portion of the lumbar spine. IMPRESSION: 1. Acute, nondisplaced fracture deformity of the left inferior pubic ramus. 2. Large right inguinal hernia which contains fat and nondilated loops of distal small bowel and extends inferiorly into the scrotum on the right. 3. Sigmoid diverticulosis. Aortic Atherosclerosis (ICD10-I70.0). Electronically Signed   By: Virgina Norfolk M.D.   On: 12/02/2019 20:27   DG Hip Unilat W or Wo Pelvis 2-3 Views Left  Result Date: 12/02/2019 CLINICAL DATA:  Left hip pain. Fall 3 weeks ago. Pain since that time with painful weight-bearing. EXAM: DG HIP (WITH OR WITHOUT PELVIS) 2-3V LEFT COMPARISON:  None. FINDINGS: Cortical irregularity of the left inferior pubic ramus is equivocal for fracture. No proximal femur fracture. Femoral head is seated in the acetabulum. Pubic symphysis and sacroiliac joints are congruent. Advanced aorto bi-iliac atherosclerosis. IMPRESSION: Cortical irregularity of the left inferior pubic ramus, suspicious for fracture. Confirmation with cross-sectional imaging could be considered. Electronically Signed   By: Keith Rake M.D.   On: 12/02/2019 19:22    EKG EKG Interpretation  Date/Time:  Tuesday Dec 13 2019 10:03:45 EDT Ventricular Rate:  83 PR Interval:    QRS Duration: 119 QT Interval:  400 QTC Calculation: 470 R Axis:   -71 Text Interpretation: Sinus rhythm Ventricular premature complex Non-specific intra-ventricular conduction delay Confirmed by Lajean Saver [phone]) on 12/13/2019 10:22:24 AM   Radiology DG Chest 2 View  Result Date: 12/13/2019 CLINICAL DATA:  Weakness. EXAM: CHEST - 2 VIEW COMPARISON:  May 27, 2018. May 29, 2018. FINDINGS: The heart size and mediastinal contours are within normal limits.  No pneumothorax pleural effusion is noted. Hyperinflation of the lungs is noted. Atherosclerosis of thoracic aorta is noted. Increased right apical opacity is noted suggesting enlarging mass as noted on prior CT scan. The visualized skeletal structures are unremarkable. IMPRESSION: Increased right apical opacity is noted suggesting enlarging mass as noted on prior CT scan. CT scan is recommended for further evaluation. Hyperinflation of the lungs. Aortic Atherosclerosis (ICD10-I70.0). Electronically Signed   By: Marijo Conception M.D.   On: 12/13/2019 11:10    Procedures Procedures (including critical care time)  Medications Ordered in ED Medications  sodium chloride 0.9 % bolus 500 mL (has no administration in time range)    ED Course  I have reviewed the triage vital signs and the nursing notes.  Pertinent labs & imaging results that were available during my care of the patient were reviewed by me and considered in my medical decision making (see chart for details).    MDM Rules/Calculators/A&P                     Iv ns. Continuous pulse ox and monitor. Stat labs. Cxr. Ecg.   Reviewed nursing notes and prior charts for additional history.  05/2018 pt noted to have ct chest with 2 masses - pt unaware, denies hx lung cancer. Will get cxr.   Ns bolus.  Initial labs reviewed/interpreted by me - wbc 10, normal.   CXR reviewed/interpreted by me - lung mass. Radiology rec ct. Will get ct.  CT reviewed/interpreted by me - left lung mass increased in size.   Recheck, no chest pain. No increased wob. Pt feels improved. Po fluids/food.   Family present. Pt/fam indicate he can f/u with Dr Legrand Rams - discussed concern for lung cancer.   Return precautions provided.        Final Clinical Impression(s) / ED Diagnoses Final diagnoses:  None    Rx / DC Orders ED Discharge Orders    None       Lajean Saver, MD 12/13/19 1356

## 2019-12-13 NOTE — ED Notes (Signed)
Pt is aware we need urine sample, urinal at bedside.  

## 2019-12-13 NOTE — ED Notes (Signed)
Pt gone to CT 

## 2019-12-13 NOTE — ED Triage Notes (Addendum)
PT brought in by [facility] from home with son that lives next door due to generalized weakness, decreased po intake for the past 3 weeks since pubic ramus fracture occurred. Systolic B/P was 90 upon ems arrival and he was given 500 mL of normal saline prior to ED arrival. PT alert and oriented and denies any pain upon arrival.

## 2019-12-15 DIAGNOSIS — M19011 Primary osteoarthritis, right shoulder: Secondary | ICD-10-CM | POA: Diagnosis not present

## 2019-12-15 DIAGNOSIS — S32592D Other specified fracture of left pubis, subsequent encounter for fracture with routine healing: Secondary | ICD-10-CM | POA: Diagnosis not present

## 2019-12-15 DIAGNOSIS — G8929 Other chronic pain: Secondary | ICD-10-CM | POA: Diagnosis not present

## 2019-12-15 DIAGNOSIS — J449 Chronic obstructive pulmonary disease, unspecified: Secondary | ICD-10-CM | POA: Diagnosis not present

## 2019-12-15 DIAGNOSIS — M545 Low back pain: Secondary | ICD-10-CM | POA: Diagnosis not present

## 2019-12-15 DIAGNOSIS — M19012 Primary osteoarthritis, left shoulder: Secondary | ICD-10-CM | POA: Diagnosis not present

## 2019-12-16 DIAGNOSIS — M19011 Primary osteoarthritis, right shoulder: Secondary | ICD-10-CM | POA: Diagnosis not present

## 2019-12-16 DIAGNOSIS — G8929 Other chronic pain: Secondary | ICD-10-CM | POA: Diagnosis not present

## 2019-12-16 DIAGNOSIS — M545 Low back pain: Secondary | ICD-10-CM | POA: Diagnosis not present

## 2019-12-16 DIAGNOSIS — J449 Chronic obstructive pulmonary disease, unspecified: Secondary | ICD-10-CM | POA: Diagnosis not present

## 2019-12-16 DIAGNOSIS — S32592D Other specified fracture of left pubis, subsequent encounter for fracture with routine healing: Secondary | ICD-10-CM | POA: Diagnosis not present

## 2019-12-16 DIAGNOSIS — M19012 Primary osteoarthritis, left shoulder: Secondary | ICD-10-CM | POA: Diagnosis not present

## 2019-12-19 DIAGNOSIS — J449 Chronic obstructive pulmonary disease, unspecified: Secondary | ICD-10-CM | POA: Diagnosis not present

## 2019-12-19 DIAGNOSIS — M19012 Primary osteoarthritis, left shoulder: Secondary | ICD-10-CM | POA: Diagnosis not present

## 2019-12-19 DIAGNOSIS — M545 Low back pain: Secondary | ICD-10-CM | POA: Diagnosis not present

## 2019-12-19 DIAGNOSIS — G8929 Other chronic pain: Secondary | ICD-10-CM | POA: Diagnosis not present

## 2019-12-19 DIAGNOSIS — S32592D Other specified fracture of left pubis, subsequent encounter for fracture with routine healing: Secondary | ICD-10-CM | POA: Diagnosis not present

## 2019-12-19 DIAGNOSIS — M19011 Primary osteoarthritis, right shoulder: Secondary | ICD-10-CM | POA: Diagnosis not present

## 2019-12-20 DIAGNOSIS — G8929 Other chronic pain: Secondary | ICD-10-CM | POA: Diagnosis not present

## 2019-12-20 DIAGNOSIS — M19011 Primary osteoarthritis, right shoulder: Secondary | ICD-10-CM | POA: Diagnosis not present

## 2019-12-20 DIAGNOSIS — M19012 Primary osteoarthritis, left shoulder: Secondary | ICD-10-CM | POA: Diagnosis not present

## 2019-12-20 DIAGNOSIS — M545 Low back pain: Secondary | ICD-10-CM | POA: Diagnosis not present

## 2019-12-20 DIAGNOSIS — S32592D Other specified fracture of left pubis, subsequent encounter for fracture with routine healing: Secondary | ICD-10-CM | POA: Diagnosis not present

## 2019-12-20 DIAGNOSIS — J449 Chronic obstructive pulmonary disease, unspecified: Secondary | ICD-10-CM | POA: Diagnosis not present

## 2019-12-21 DIAGNOSIS — S32592D Other specified fracture of left pubis, subsequent encounter for fracture with routine healing: Secondary | ICD-10-CM | POA: Diagnosis not present

## 2019-12-21 DIAGNOSIS — M545 Low back pain: Secondary | ICD-10-CM | POA: Diagnosis not present

## 2019-12-21 DIAGNOSIS — M19012 Primary osteoarthritis, left shoulder: Secondary | ICD-10-CM | POA: Diagnosis not present

## 2019-12-21 DIAGNOSIS — J449 Chronic obstructive pulmonary disease, unspecified: Secondary | ICD-10-CM | POA: Diagnosis not present

## 2019-12-21 DIAGNOSIS — G8929 Other chronic pain: Secondary | ICD-10-CM | POA: Diagnosis not present

## 2019-12-21 DIAGNOSIS — M19011 Primary osteoarthritis, right shoulder: Secondary | ICD-10-CM | POA: Diagnosis not present

## 2019-12-26 DIAGNOSIS — S32592D Other specified fracture of left pubis, subsequent encounter for fracture with routine healing: Secondary | ICD-10-CM | POA: Diagnosis not present

## 2019-12-26 DIAGNOSIS — M19011 Primary osteoarthritis, right shoulder: Secondary | ICD-10-CM | POA: Diagnosis not present

## 2019-12-26 DIAGNOSIS — G8929 Other chronic pain: Secondary | ICD-10-CM | POA: Diagnosis not present

## 2019-12-26 DIAGNOSIS — M19012 Primary osteoarthritis, left shoulder: Secondary | ICD-10-CM | POA: Diagnosis not present

## 2019-12-26 DIAGNOSIS — J449 Chronic obstructive pulmonary disease, unspecified: Secondary | ICD-10-CM | POA: Diagnosis not present

## 2019-12-26 DIAGNOSIS — M545 Low back pain: Secondary | ICD-10-CM | POA: Diagnosis not present

## 2019-12-27 DIAGNOSIS — M545 Low back pain: Secondary | ICD-10-CM | POA: Diagnosis not present

## 2019-12-27 DIAGNOSIS — M19011 Primary osteoarthritis, right shoulder: Secondary | ICD-10-CM | POA: Diagnosis not present

## 2019-12-27 DIAGNOSIS — S32592D Other specified fracture of left pubis, subsequent encounter for fracture with routine healing: Secondary | ICD-10-CM | POA: Diagnosis not present

## 2019-12-27 DIAGNOSIS — J449 Chronic obstructive pulmonary disease, unspecified: Secondary | ICD-10-CM | POA: Diagnosis not present

## 2019-12-27 DIAGNOSIS — M19012 Primary osteoarthritis, left shoulder: Secondary | ICD-10-CM | POA: Diagnosis not present

## 2019-12-27 DIAGNOSIS — G8929 Other chronic pain: Secondary | ICD-10-CM | POA: Diagnosis not present

## 2019-12-28 DIAGNOSIS — M19012 Primary osteoarthritis, left shoulder: Secondary | ICD-10-CM | POA: Diagnosis not present

## 2019-12-28 DIAGNOSIS — J449 Chronic obstructive pulmonary disease, unspecified: Secondary | ICD-10-CM | POA: Diagnosis not present

## 2019-12-28 DIAGNOSIS — G8929 Other chronic pain: Secondary | ICD-10-CM | POA: Diagnosis not present

## 2019-12-28 DIAGNOSIS — M545 Low back pain: Secondary | ICD-10-CM | POA: Diagnosis not present

## 2019-12-28 DIAGNOSIS — S32592D Other specified fracture of left pubis, subsequent encounter for fracture with routine healing: Secondary | ICD-10-CM | POA: Diagnosis not present

## 2019-12-28 DIAGNOSIS — M19011 Primary osteoarthritis, right shoulder: Secondary | ICD-10-CM | POA: Diagnosis not present

## 2019-12-30 DIAGNOSIS — I10 Essential (primary) hypertension: Secondary | ICD-10-CM | POA: Diagnosis not present

## 2019-12-30 DIAGNOSIS — R296 Repeated falls: Secondary | ICD-10-CM | POA: Diagnosis not present

## 2019-12-30 DIAGNOSIS — R2689 Other abnormalities of gait and mobility: Secondary | ICD-10-CM | POA: Diagnosis not present

## 2019-12-30 DIAGNOSIS — J449 Chronic obstructive pulmonary disease, unspecified: Secondary | ICD-10-CM | POA: Diagnosis not present

## 2020-01-03 DIAGNOSIS — M19012 Primary osteoarthritis, left shoulder: Secondary | ICD-10-CM | POA: Diagnosis not present

## 2020-01-03 DIAGNOSIS — M545 Low back pain: Secondary | ICD-10-CM | POA: Diagnosis not present

## 2020-01-03 DIAGNOSIS — J449 Chronic obstructive pulmonary disease, unspecified: Secondary | ICD-10-CM | POA: Diagnosis not present

## 2020-01-03 DIAGNOSIS — G8929 Other chronic pain: Secondary | ICD-10-CM | POA: Diagnosis not present

## 2020-01-03 DIAGNOSIS — M19011 Primary osteoarthritis, right shoulder: Secondary | ICD-10-CM | POA: Diagnosis not present

## 2020-01-03 DIAGNOSIS — S32592D Other specified fracture of left pubis, subsequent encounter for fracture with routine healing: Secondary | ICD-10-CM | POA: Diagnosis not present

## 2020-01-04 DIAGNOSIS — I700 Atherosclerosis of aorta: Secondary | ICD-10-CM | POA: Diagnosis not present

## 2020-01-04 DIAGNOSIS — M19012 Primary osteoarthritis, left shoulder: Secondary | ICD-10-CM | POA: Diagnosis not present

## 2020-01-04 DIAGNOSIS — Z86010 Personal history of colonic polyps: Secondary | ICD-10-CM | POA: Diagnosis not present

## 2020-01-04 DIAGNOSIS — K4090 Unilateral inguinal hernia, without obstruction or gangrene, not specified as recurrent: Secondary | ICD-10-CM | POA: Diagnosis not present

## 2020-01-04 DIAGNOSIS — H9190 Unspecified hearing loss, unspecified ear: Secondary | ICD-10-CM | POA: Diagnosis not present

## 2020-01-04 DIAGNOSIS — I10 Essential (primary) hypertension: Secondary | ICD-10-CM | POA: Diagnosis not present

## 2020-01-04 DIAGNOSIS — M19011 Primary osteoarthritis, right shoulder: Secondary | ICD-10-CM | POA: Diagnosis not present

## 2020-01-04 DIAGNOSIS — Z9981 Dependence on supplemental oxygen: Secondary | ICD-10-CM | POA: Diagnosis not present

## 2020-01-04 DIAGNOSIS — K219 Gastro-esophageal reflux disease without esophagitis: Secondary | ICD-10-CM | POA: Diagnosis not present

## 2020-01-04 DIAGNOSIS — E7800 Pure hypercholesterolemia, unspecified: Secondary | ICD-10-CM | POA: Diagnosis not present

## 2020-01-04 DIAGNOSIS — Z8673 Personal history of transient ischemic attack (TIA), and cerebral infarction without residual deficits: Secondary | ICD-10-CM | POA: Diagnosis not present

## 2020-01-04 DIAGNOSIS — J449 Chronic obstructive pulmonary disease, unspecified: Secondary | ICD-10-CM | POA: Diagnosis not present

## 2020-01-04 DIAGNOSIS — S32592D Other specified fracture of left pubis, subsequent encounter for fracture with routine healing: Secondary | ICD-10-CM | POA: Diagnosis not present

## 2020-01-04 DIAGNOSIS — M545 Low back pain: Secondary | ICD-10-CM | POA: Diagnosis not present

## 2020-01-04 DIAGNOSIS — Z9181 History of falling: Secondary | ICD-10-CM | POA: Diagnosis not present

## 2020-01-04 DIAGNOSIS — Z85828 Personal history of other malignant neoplasm of skin: Secondary | ICD-10-CM | POA: Diagnosis not present

## 2020-01-04 DIAGNOSIS — F17210 Nicotine dependence, cigarettes, uncomplicated: Secondary | ICD-10-CM | POA: Diagnosis not present

## 2020-01-04 DIAGNOSIS — K5730 Diverticulosis of large intestine without perforation or abscess without bleeding: Secondary | ICD-10-CM | POA: Diagnosis not present

## 2020-01-04 DIAGNOSIS — G8929 Other chronic pain: Secondary | ICD-10-CM | POA: Diagnosis not present

## 2020-01-05 DIAGNOSIS — M19012 Primary osteoarthritis, left shoulder: Secondary | ICD-10-CM | POA: Diagnosis not present

## 2020-01-05 DIAGNOSIS — S32592D Other specified fracture of left pubis, subsequent encounter for fracture with routine healing: Secondary | ICD-10-CM | POA: Diagnosis not present

## 2020-01-05 DIAGNOSIS — M545 Low back pain: Secondary | ICD-10-CM | POA: Diagnosis not present

## 2020-01-05 DIAGNOSIS — M19011 Primary osteoarthritis, right shoulder: Secondary | ICD-10-CM | POA: Diagnosis not present

## 2020-01-05 DIAGNOSIS — G8929 Other chronic pain: Secondary | ICD-10-CM | POA: Diagnosis not present

## 2020-01-05 DIAGNOSIS — J449 Chronic obstructive pulmonary disease, unspecified: Secondary | ICD-10-CM | POA: Diagnosis not present

## 2020-01-06 DIAGNOSIS — M545 Low back pain: Secondary | ICD-10-CM | POA: Diagnosis not present

## 2020-01-06 DIAGNOSIS — G8929 Other chronic pain: Secondary | ICD-10-CM | POA: Diagnosis not present

## 2020-01-06 DIAGNOSIS — S32592D Other specified fracture of left pubis, subsequent encounter for fracture with routine healing: Secondary | ICD-10-CM | POA: Diagnosis not present

## 2020-01-06 DIAGNOSIS — M19012 Primary osteoarthritis, left shoulder: Secondary | ICD-10-CM | POA: Diagnosis not present

## 2020-01-06 DIAGNOSIS — J449 Chronic obstructive pulmonary disease, unspecified: Secondary | ICD-10-CM | POA: Diagnosis not present

## 2020-01-06 DIAGNOSIS — M19011 Primary osteoarthritis, right shoulder: Secondary | ICD-10-CM | POA: Diagnosis not present

## 2020-01-11 DIAGNOSIS — M19012 Primary osteoarthritis, left shoulder: Secondary | ICD-10-CM | POA: Diagnosis not present

## 2020-01-11 DIAGNOSIS — M19011 Primary osteoarthritis, right shoulder: Secondary | ICD-10-CM | POA: Diagnosis not present

## 2020-01-11 DIAGNOSIS — M545 Low back pain: Secondary | ICD-10-CM | POA: Diagnosis not present

## 2020-01-11 DIAGNOSIS — G8929 Other chronic pain: Secondary | ICD-10-CM | POA: Diagnosis not present

## 2020-01-11 DIAGNOSIS — J449 Chronic obstructive pulmonary disease, unspecified: Secondary | ICD-10-CM | POA: Diagnosis not present

## 2020-01-11 DIAGNOSIS — S32592D Other specified fracture of left pubis, subsequent encounter for fracture with routine healing: Secondary | ICD-10-CM | POA: Diagnosis not present

## 2020-01-18 DIAGNOSIS — J449 Chronic obstructive pulmonary disease, unspecified: Secondary | ICD-10-CM | POA: Diagnosis not present

## 2020-01-18 DIAGNOSIS — S32592D Other specified fracture of left pubis, subsequent encounter for fracture with routine healing: Secondary | ICD-10-CM | POA: Diagnosis not present

## 2020-01-18 DIAGNOSIS — M19012 Primary osteoarthritis, left shoulder: Secondary | ICD-10-CM | POA: Diagnosis not present

## 2020-01-18 DIAGNOSIS — G8929 Other chronic pain: Secondary | ICD-10-CM | POA: Diagnosis not present

## 2020-01-18 DIAGNOSIS — M545 Low back pain: Secondary | ICD-10-CM | POA: Diagnosis not present

## 2020-01-18 DIAGNOSIS — M19011 Primary osteoarthritis, right shoulder: Secondary | ICD-10-CM | POA: Diagnosis not present

## 2020-03-11 DEATH — deceased

## 2021-08-23 IMAGING — CT CT CHEST W/ CM
2 of 4 series · 12 of 36 positions shown, 15 images · IV contrast (Omnipaque or Isovue)
Comparison: [DATE] [DATE], [DATE]. [DATE] [DATE], [DATE]. [DATE] [DATE], [DATE].

CLINICAL DATA: Weakness. Lung nodule.

EXAM:
CT CHEST, ABDOMEN, AND PELVIS WITH CONTRAST
TECHNIQUE: Multidetector CT imaging of the chest, abdomen and pelvis was
performed following the standard protocol during bolus
administration of intravenous contrast.
CONTRAST:  100mL OMNIPAQUE IOHEXOL 300 MG/ML  SOLN

[Series 2: cap with · axial · 0.79mm/px · z∈[+736,+1286]mm · 9 of 138 slices shown, 12 images]
[im 14/138  mediastinal]
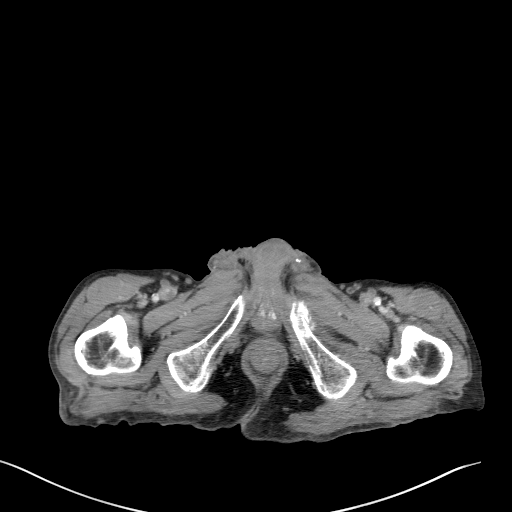
[im 14/138  lung]
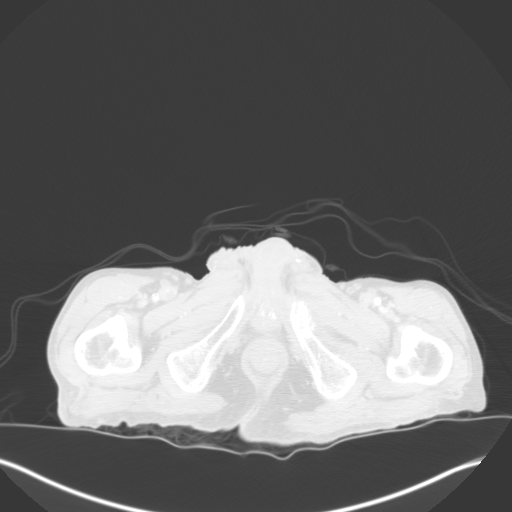
[im 27/138  lung]
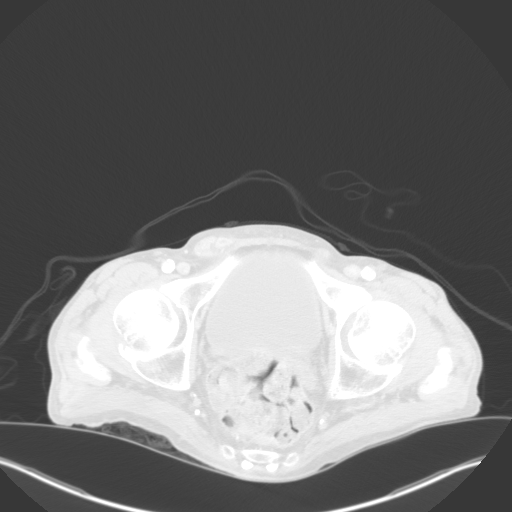
[im 40/138  lung]
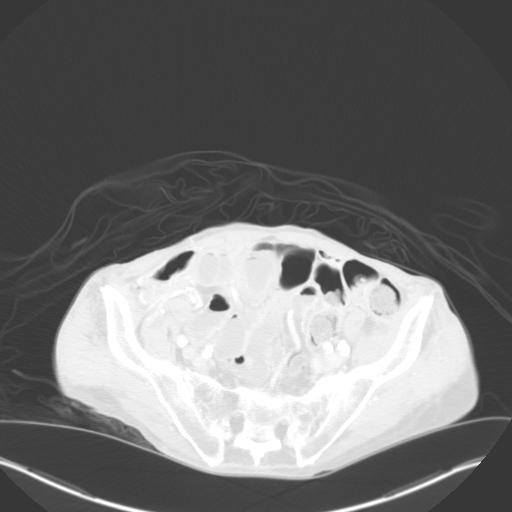
[im 53/138  lung]
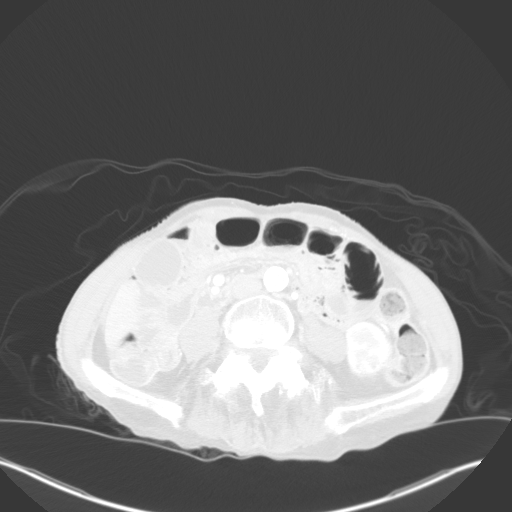
[im 72/138  mediastinal]
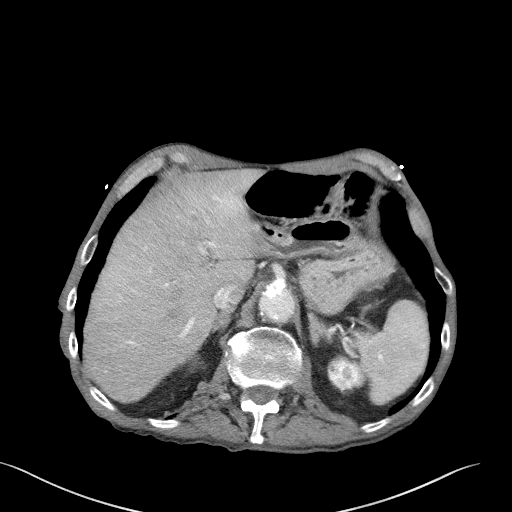
[im 72/138  lung]
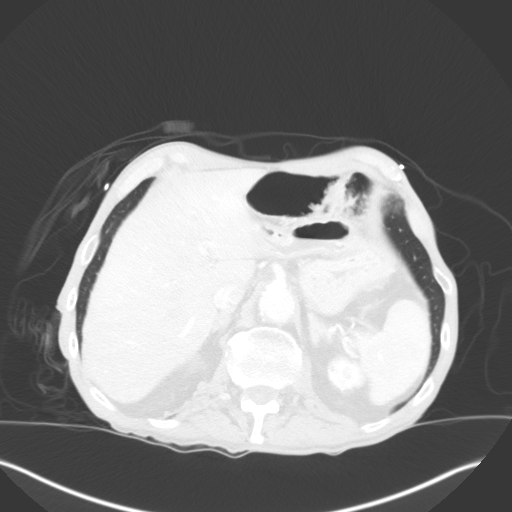
[im 85/138  lung]
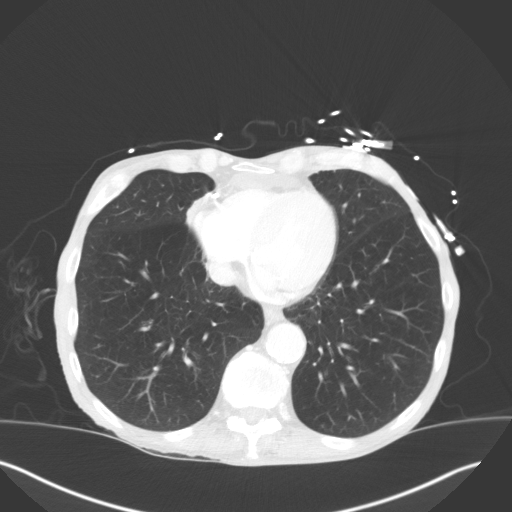
[im 98/138  lung]
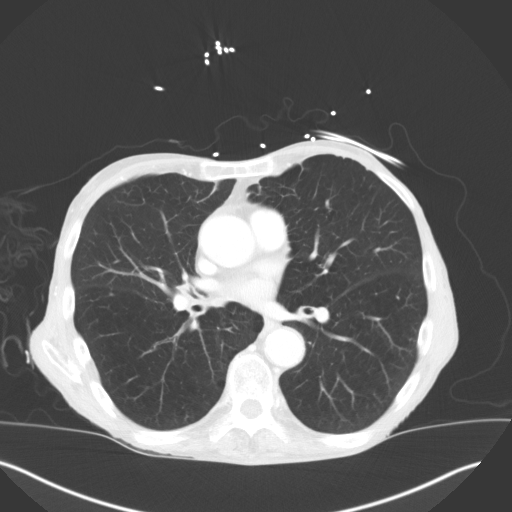
[im 111/138  lung]
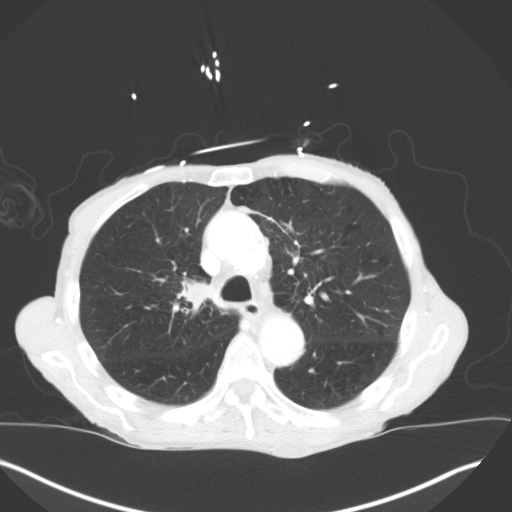
[im 124/138  mediastinal]
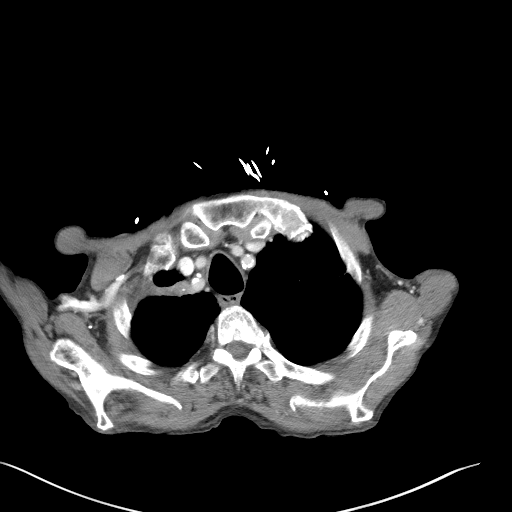
[im 124/138  lung]
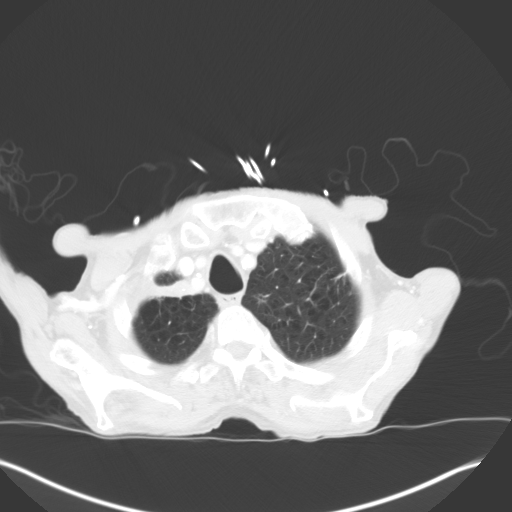

[Series 4: coronals · coronal · 0.76mm/px · 3 of 137 slices shown]
[im 28/137  lung]
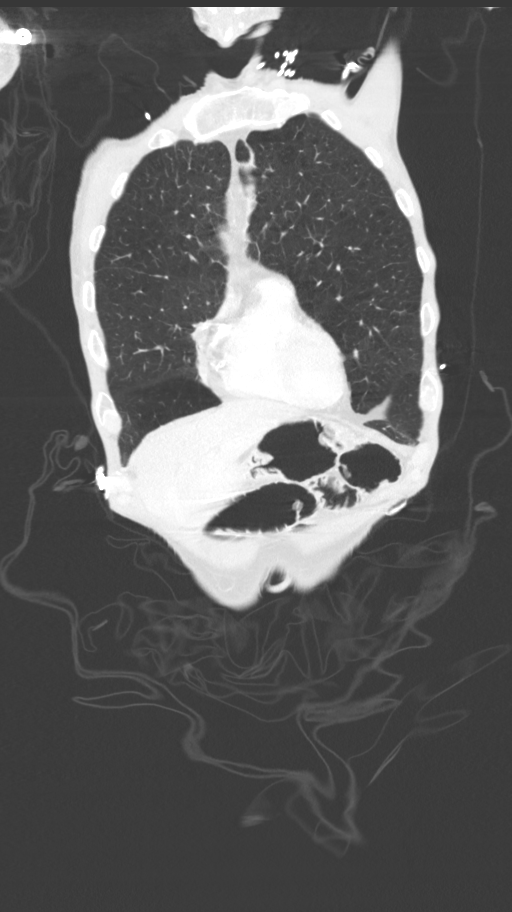
[im 55/137  lung]
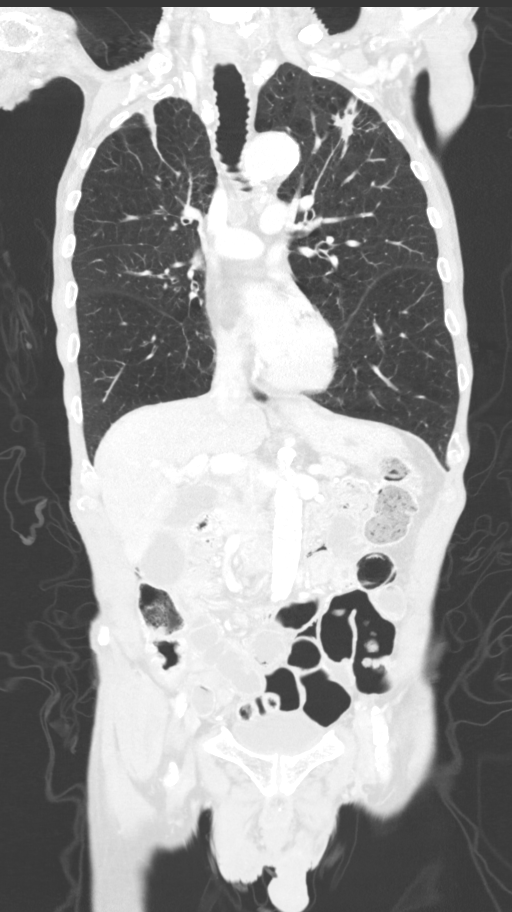
[im 82/137  lung]
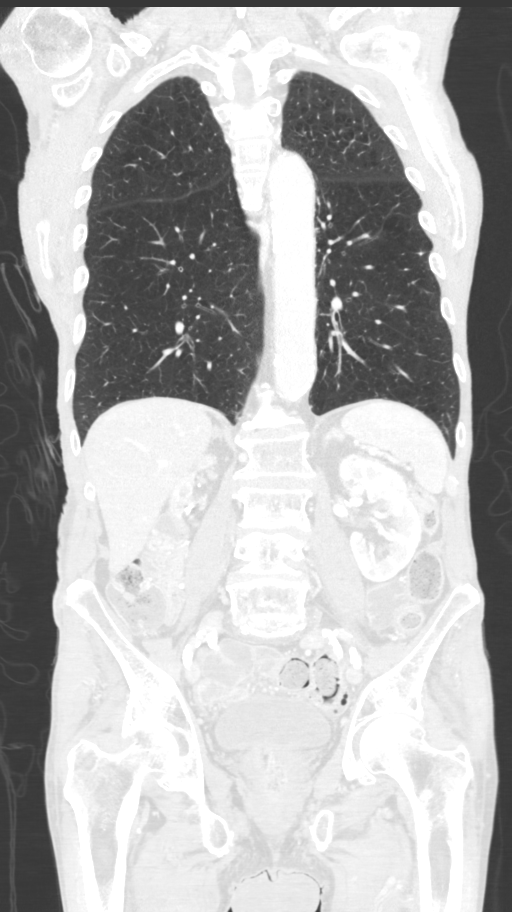

[12 of 36 positions shown; findings below may reference images not displayed]

FINDINGS: CT CHEST FINDINGS

Cardiovascular: Atherosclerosis of thoracic aorta is noted without
aneurysm or dissection. Normal cardiac size. Minimal pericardial
effusion is noted. Mild coronary artery calcifications are noted.

Mediastinum/Nodes: Thyroid gland is unremarkable. Small sliding-type
hiatal hernia is noted. New 1.5 cm precarinal lymph node is noted.
1.6 cm subcarinal lymph node is noted. Stable calcified right hilar
lymph nodes are noted.

Lungs/Pleura: No pneumothorax or pleural effusion is noted. 2.2 x
1.3 cm spiculated mass with pleural tail is noted laterally in the
left upper lobe which is significantly increased in size compared to
prior exam, consistent with malignancy. New 1.3 cm irregular density
is noted posteriorly in left lower lobe concerning for possible
metastatic lesion. Emphysematous disease is noted throughout both
lungs. Hyperinflation of the lungs is noted. Right upper lobe
density noted on prior exam is significantly smaller, suggesting
improved cavitary pneumonia, with residual postinfectious scarring
present.

Musculoskeletal: No chest wall mass or suspicious bone lesions
identified.

CT ABDOMEN PELVIS FINDINGS

Hepatobiliary: No focal liver abnormality is seen. No gallstones,
gallbladder wall thickening, or biliary dilatation.

Pancreas: Unremarkable. No pancreatic ductal dilatation or
surrounding inflammatory changes.

Spleen: Normal in size without focal abnormality.

Adrenals/Urinary Tract: Adrenal glands appear normal. Right renal
atrophy is noted. Left renal cysts are noted. No hydronephrosis or
renal obstruction is noted. Urinary bladder is unremarkable.

Stomach/Bowel: The stomach appears normal. There is no definite
evidence of bowel obstruction or inflammation. The appendix is not
clearly visualized, but no inflammation is noted in the right lower
quadrant. Small bowel loop is noted in moderate size right inguinal
hernia, but does not appear to result in obstruction.

Vascular/Lymphatic: Aortic atherosclerosis. No enlarged abdominal or
pelvic lymph nodes.

Reproductive: Mild prostatic enlargement is noted.

Other: No abdominal wall hernia or abnormality. No abdominopelvic
ascites.

Musculoskeletal: Grade 1 anterolisthesis of L5-S1 is noted secondary
to bilateral L5 spondylolysis. No acute osseous abnormality is
noted.
IMPRESSION: 1. 2.2 x 1.3 cm spiculated mass with pleural tail is noted laterally
in the left upper lobe which is significantly increased in size
compared to prior exam, consistent with malignancy. Significantly
enlarged mediastinal adenopathy is noted concerning for metastatic
disease. PET scan is recommended for further evaluation.
2. New 1.3 cm irregular density is noted posteriorly in left lower
lobe concerning for possible metastatic lesion.
3. Right upper lobe density noted on prior exam is significantly
smaller, suggesting improved cavitary pneumonia, with residual
postinfectious scarring present.
4. Small sliding-type hiatal hernia is noted.
5. Right renal atrophy is noted.
6. Mild prostatic enlargement.
7. Small bowel loop is noted in moderate size right inguinal hernia,
but does not appear to result in obstruction.
8. Grade 1 anterolisthesis of L5-S1 secondary to bilateral L5
spondylolysis.

Aortic Atherosclerosis (J7RZN-EXB.B) and Emphysema (J7RZN-MKR.2).

## 2021-08-23 IMAGING — CT CT ABD-PELV W/ CM
2 of 4 series · 12 of 36 positions shown, 15 images · IV contrast (omnipaque)
Comparison: [DATE] [DATE], [DATE]. [DATE] [DATE], [DATE]. [DATE] [DATE], [DATE].

CLINICAL DATA: Weakness. Lung nodule.

EXAM:
CT CHEST, ABDOMEN, AND PELVIS WITH CONTRAST
TECHNIQUE: Multidetector CT imaging of the chest, abdomen and pelvis was
performed following the standard protocol during bolus
administration of intravenous contrast.
CONTRAST:  100mL OMNIPAQUE IOHEXOL 300 MG/ML  SOLN

[Series 2: cap with · axial · 0.79mm/px · z∈[+736,+1286]mm · 9 of 138 slices shown, 12 images]
[im 14/138  mediastinal]
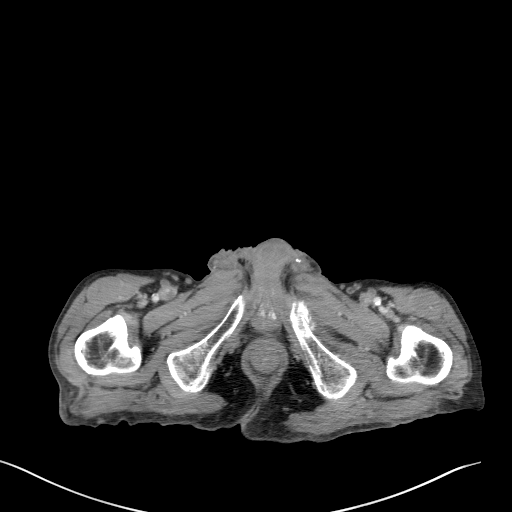
[im 14/138  lung]
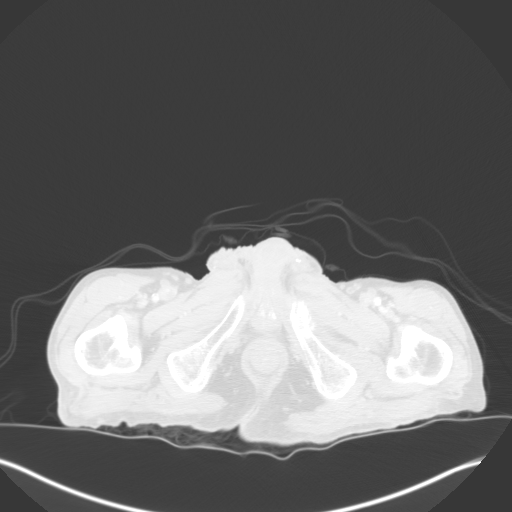
[im 27/138  lung]
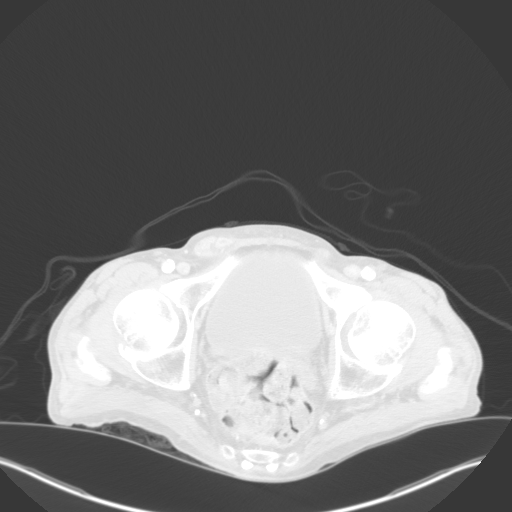
[im 40/138  lung]
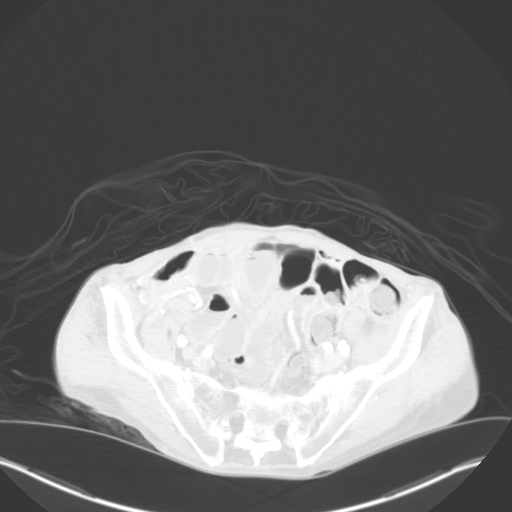
[im 53/138  lung]
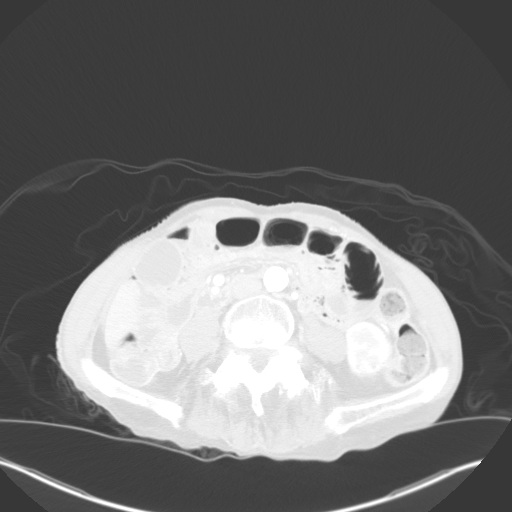
[im 72/138  mediastinal]
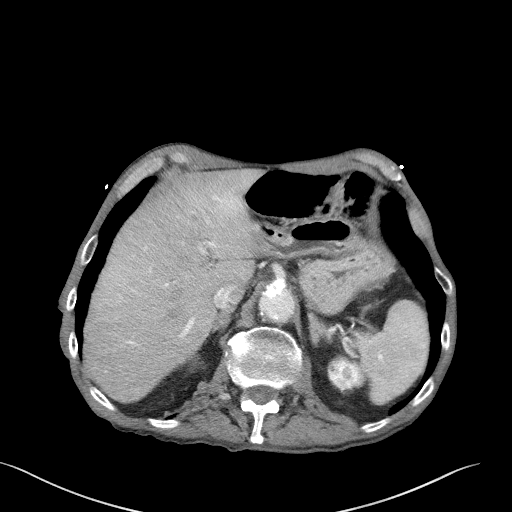
[im 72/138  lung]
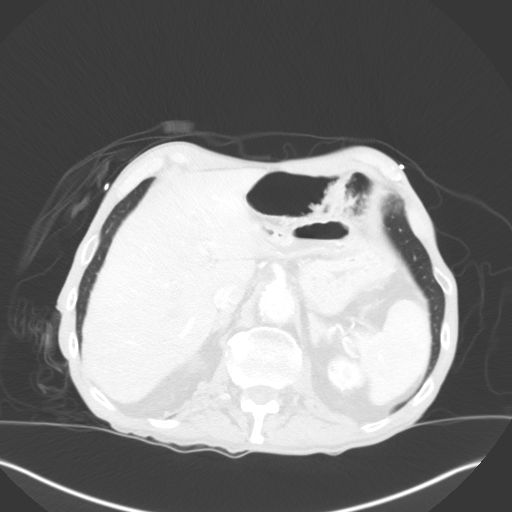
[im 85/138  lung]
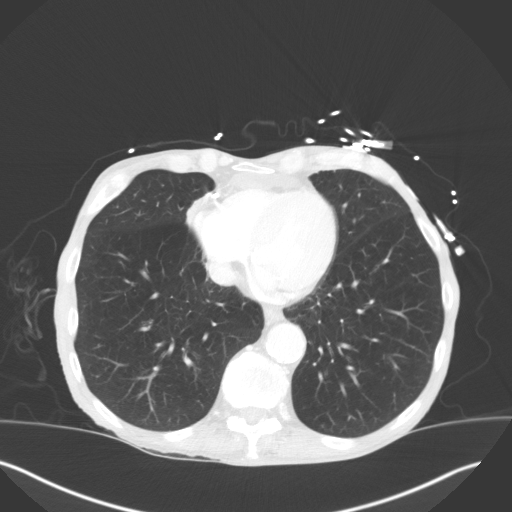
[im 98/138  lung]
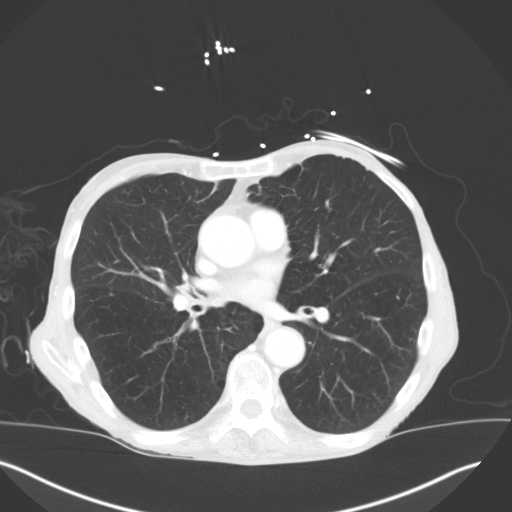
[im 111/138  lung]
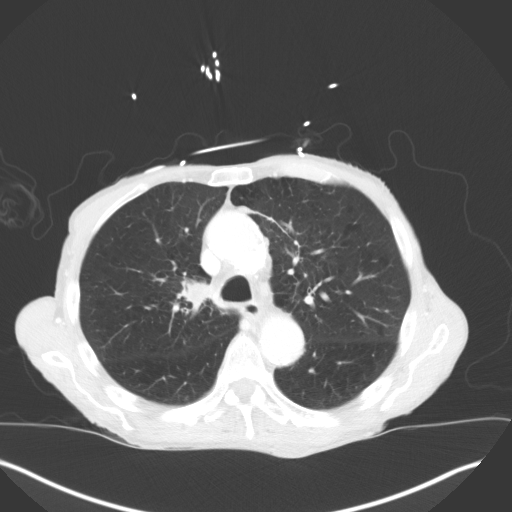
[im 124/138  mediastinal]
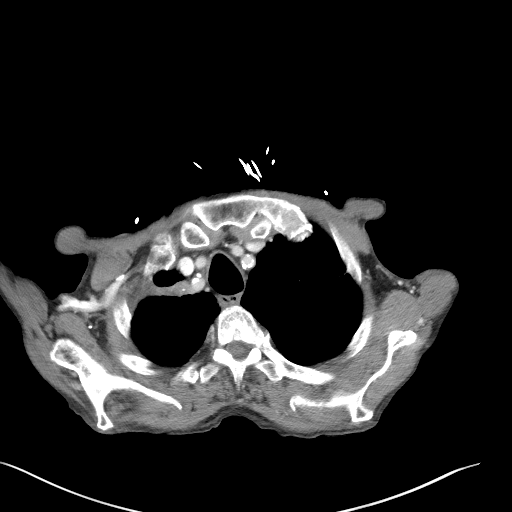
[im 124/138  lung]
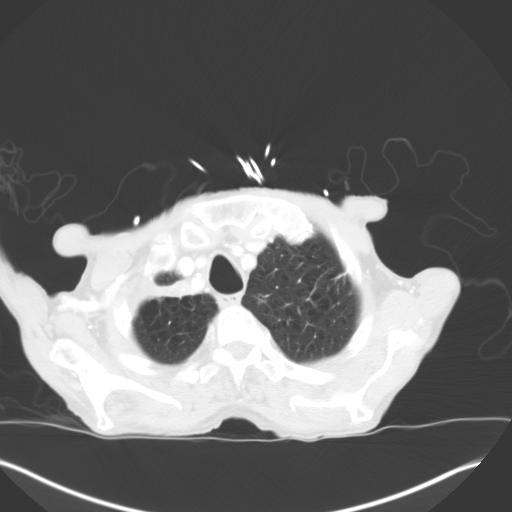

[Series 4: coronals · coronal · 0.76mm/px · 3 of 137 slices shown]
[im 28/137  lung]
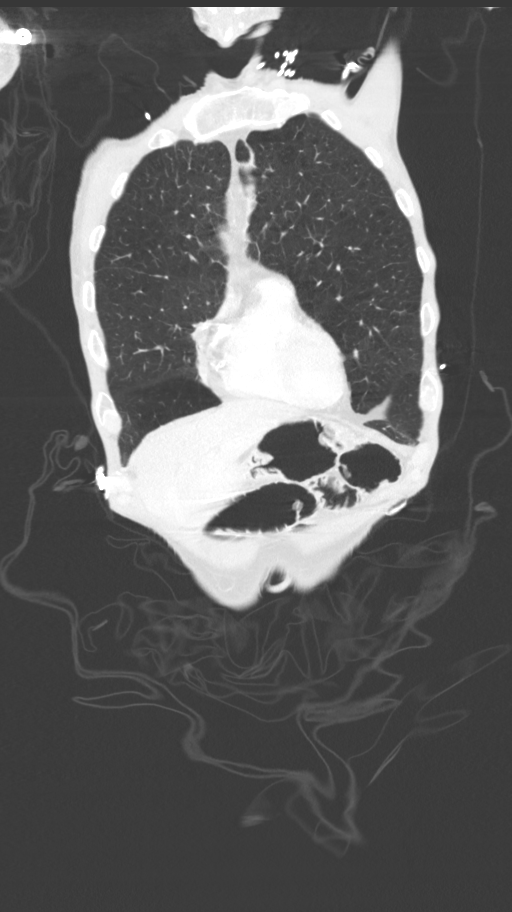
[im 55/137  lung]
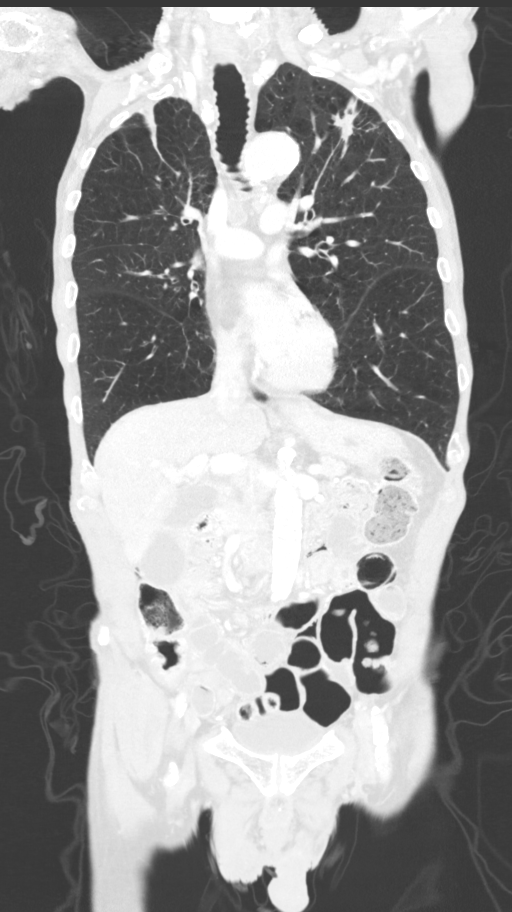
[im 82/137  lung]
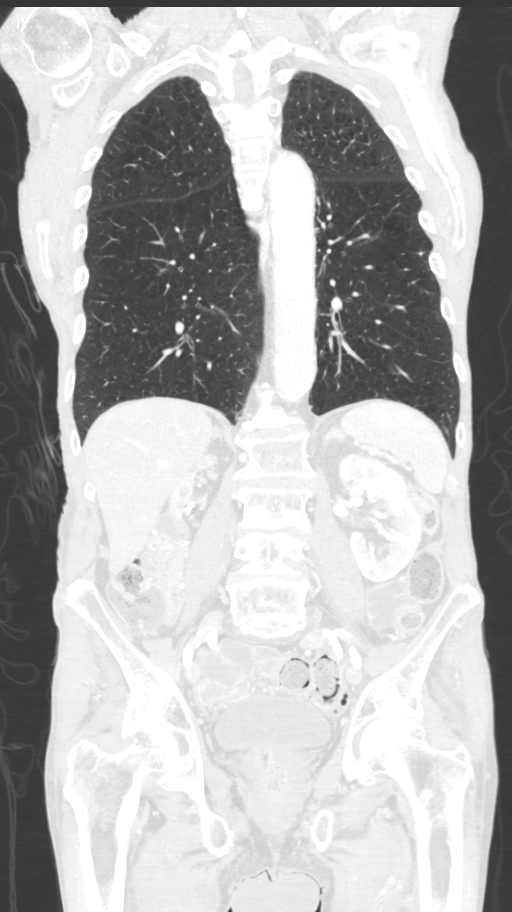

[12 of 36 positions shown; findings below may reference images not displayed]

FINDINGS: CT CHEST FINDINGS

Cardiovascular: Atherosclerosis of thoracic aorta is noted without
aneurysm or dissection. Normal cardiac size. Minimal pericardial
effusion is noted. Mild coronary artery calcifications are noted.

Mediastinum/Nodes: Thyroid gland is unremarkable. Small sliding-type
hiatal hernia is noted. New 1.5 cm precarinal lymph node is noted.
1.6 cm subcarinal lymph node is noted. Stable calcified right hilar
lymph nodes are noted.

Lungs/Pleura: No pneumothorax or pleural effusion is noted. 2.2 x
1.3 cm spiculated mass with pleural tail is noted laterally in the
left upper lobe which is significantly increased in size compared to
prior exam, consistent with malignancy. New 1.3 cm irregular density
is noted posteriorly in left lower lobe concerning for possible
metastatic lesion. Emphysematous disease is noted throughout both
lungs. Hyperinflation of the lungs is noted. Right upper lobe
density noted on prior exam is significantly smaller, suggesting
improved cavitary pneumonia, with residual postinfectious scarring
present.

Musculoskeletal: No chest wall mass or suspicious bone lesions
identified.

CT ABDOMEN PELVIS FINDINGS

Hepatobiliary: No focal liver abnormality is seen. No gallstones,
gallbladder wall thickening, or biliary dilatation.

Pancreas: Unremarkable. No pancreatic ductal dilatation or
surrounding inflammatory changes.

Spleen: Normal in size without focal abnormality.

Adrenals/Urinary Tract: Adrenal glands appear normal. Right renal
atrophy is noted. Left renal cysts are noted. No hydronephrosis or
renal obstruction is noted. Urinary bladder is unremarkable.

Stomach/Bowel: The stomach appears normal. There is no definite
evidence of bowel obstruction or inflammation. The appendix is not
clearly visualized, but no inflammation is noted in the right lower
quadrant. Small bowel loop is noted in moderate size right inguinal
hernia, but does not appear to result in obstruction.

Vascular/Lymphatic: Aortic atherosclerosis. No enlarged abdominal or
pelvic lymph nodes.

Reproductive: Mild prostatic enlargement is noted.

Other: No abdominal wall hernia or abnormality. No abdominopelvic
ascites.

Musculoskeletal: Grade 1 anterolisthesis of L5-S1 is noted secondary
to bilateral L5 spondylolysis. No acute osseous abnormality is
noted.
IMPRESSION: 1. 2.2 x 1.3 cm spiculated mass with pleural tail is noted laterally
in the left upper lobe which is significantly increased in size
compared to prior exam, consistent with malignancy. Significantly
enlarged mediastinal adenopathy is noted concerning for metastatic
disease. PET scan is recommended for further evaluation.
2. New 1.3 cm irregular density is noted posteriorly in left lower
lobe concerning for possible metastatic lesion.
3. Right upper lobe density noted on prior exam is significantly
smaller, suggesting improved cavitary pneumonia, with residual
postinfectious scarring present.
4. Small sliding-type hiatal hernia is noted.
5. Right renal atrophy is noted.
6. Mild prostatic enlargement.
7. Small bowel loop is noted in moderate size right inguinal hernia,
but does not appear to result in obstruction.
8. Grade 1 anterolisthesis of L5-S1 secondary to bilateral L5
spondylolysis.

Aortic Atherosclerosis (J7RZN-EXB.B) and Emphysema (J7RZN-MKR.2).
